# Patient Record
Sex: Female | Born: 1995
Health system: Southern US, Community
[De-identification: ages and names within clinical notes are randomized; demographics above are authoritative.]

## PROBLEM LIST (undated history)

## (undated) DIAGNOSIS — F32A Depression, unspecified: Secondary | ICD-10-CM

## (undated) DIAGNOSIS — G43909 Migraine, unspecified, not intractable, without status migrainosus: Secondary | ICD-10-CM

## (undated) DIAGNOSIS — E079 Disorder of thyroid, unspecified: Secondary | ICD-10-CM

## (undated) DIAGNOSIS — F329 Major depressive disorder, single episode, unspecified: Secondary | ICD-10-CM

## (undated) HISTORY — DX: Disorder of thyroid, unspecified: E07.9

## (undated) HISTORY — DX: Major depressive disorder, single episode, unspecified: F32.9

## (undated) HISTORY — DX: Depression, unspecified: F32.A

## (undated) HISTORY — DX: Migraine, unspecified, not intractable, without status migrainosus: G43.909

---
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
## 2018-01-04 DIAGNOSIS — J029 Acute pharyngitis, unspecified: Secondary | ICD-10-CM | POA: Diagnosis not present

## 2018-01-27 ENCOUNTER — Encounter: Payer: Self-pay | Admitting: Family Medicine

## 2018-01-27 ENCOUNTER — Ambulatory Visit: Payer: 59 | Admitting: Family Medicine

## 2018-01-27 VITALS — BP 118/82 | HR 112 | Temp 98.5°F | Ht 64.0 in | Wt 149.0 lb

## 2018-01-27 DIAGNOSIS — Z7689 Persons encountering health services in other specified circumstances: Secondary | ICD-10-CM | POA: Diagnosis not present

## 2018-01-27 DIAGNOSIS — Z8669 Personal history of other diseases of the nervous system and sense organs: Secondary | ICD-10-CM | POA: Diagnosis not present

## 2018-01-27 DIAGNOSIS — L309 Dermatitis, unspecified: Secondary | ICD-10-CM | POA: Diagnosis not present

## 2018-01-27 DIAGNOSIS — Z8659 Personal history of other mental and behavioral disorders: Secondary | ICD-10-CM

## 2018-01-27 MED ORDER — TRIAMCINOLONE ACETONIDE 0.1 % EX CREA: 1.0000 "application " | TOPICAL_CREAM | Freq: Two times a day (BID) | CUTANEOUS | 1 refills | Status: AC

## 2018-01-27 NOTE — Patient Instructions (Signed)

## 2018-01-27 NOTE — Progress Notes (Signed)
Patient presents to clinic today to establish care.  Pt is accompanied by her sister and her nephew.  SUBJECTIVE: PMH: Pt is a 22 yo female with pmh sig for h/o depression, eczema, and h/o migraine.  Pt states it has been a few yrs since seeing a physician, was using UC.  H/o depression: -states happened a few yrs ago when d/c from the [employer]. -pt notes going to counseling at that time, which helped -pt was never on medication  H/o migraines: -last HA was yrs ago -pt denies current issues  Eczema: -pt notes hx since childhood -has flairs when the seasons change -currently using scented lotions and soaps, taking hot showers. -pt notes current flair is the worst it has been  Allergies: NKDA  Past Surg. Hx: none  Social hx: -pt is single.  She is currently working at [employer], which she enjoys.  Pt endorses social EtOH use.  Pt denies Tobacco and drug use.  Health Maintenance: PAP -- never had  Past Medical History:  Diagnosis Date  . Depression   . Migraines     History reviewed. No pertinent surgical history.  No current outpatient medications on file prior to visit.   No current facility-administered medications on file prior to visit.     No Known Allergies  Family History  Problem Relation Age of Onset  . Asthma Mother   . Hyperlipidemia Mother   . Hypertension Mother     Social History   Socioeconomic History  . Marital status: Single    Spouse name: Not on file  . Number of children: Not on file  . Years of education: Not on file  . Highest education level: Not on file  Occupational History  . Not on file  Social Needs  . Financial resource strain: Not on file  . Food insecurity:    Worry: Not on file    Inability: Not on file  . Transportation needs:    Medical: Not on file    Non-medical: Not on file  Tobacco Use  . Smoking status: Never Smoker  . Smokeless tobacco: Never Used  Substance and Sexual Activity  . Alcohol  use: Yes  . Drug use: Never  . Sexual activity: Not Currently  Lifestyle  . Physical activity:    Days per week: Not on file    Minutes per session: Not on file  . Stress: Not on file  Relationships  . Social connections:    Talks on phone: Not on file    Gets together: Not on file    Attends religious service: Not on file    Active member of club or organization: Not on file    Attends meetings of clubs or organizations: Not on file    Relationship status: Not on file  . Intimate partner violence:    Fear of current or ex partner: Not on file    Emotionally abused: Not on file    Physically abused: Not on file    Forced sexual activity: Not on file  Other Topics Concern  . Not on file  Social History Narrative  . Not on file    ROS General: Denies fever, chills, night sweats, changes in weight, changes in appetite HEENT: Denies headaches, ear pain, changes in vision, rhinorrhea, sore throat CV: Denies CP, palpitations, SOB, orthopnea Pulm: Denies SOB, cough, wheezing GI: Denies abdominal pain, nausea, vomiting, diarrhea, constipation GU: Denies dysuria, hematuria, frequency, vaginal discharge Msk: Denies muscle cramps,  joint pains Neuro: Denies weakness, numbness, tingling Skin: Denies bruising  +rash Psych: Denies depression, anxiety, hallucinations  BP 118/82 (BP Location: Left Arm, Patient Position: Sitting, Cuff Size: Normal)   Pulse (!) 112   Temp 98.5 F (36.9 C) (Oral)   Ht 5\' 4"  (1.626 m)   Wt 149 lb (67.6 kg)   LMP 01/21/2018 (Exact Date)   SpO2 98%   BMI 25.58 kg/m   Physical Exam Gen. Pleasant, well developed, well-nourished, in NAD HEENT - [place]/AT, PERRL, no scleral icterus, no nasal drainage, pharynx without erythema or exudate. Lungs: no use of accessory muscles, no dullness to percussion, CTAB, no wheezes, rales or rhonchi Cardiovascular: RRR, No r/g/m, no peripheral edema Neuro:  A&Ox3, CN II-XII intact, normal gait Skin:  Warm, dry, intact.   Fine, rough papules on dorsum of hands, forearms, medial thighs b/l.  No results found for this or any previous visit (from the past 2160 hour(s)).  Assessment/Plan: Eczema, unspecified type  -given handouts -discussed taking warm not hot showers, using gentle lotions, detergents, and soaps for sensitive skin - Plan: triamcinolone cream (KENALOG) 0.1 %  History of migraine -stable -discussed increasing po intake of water, decreasing caffeine, and getting plenty of rest.  History of depression -stable  Encounter to est care --We reviewed the PMH, PSH, FH, SH, Meds and Allergies. -We provided refills for any medications we will prescribe as needed. -We addressed current concerns per orders and patient instructions. -We have asked for records for pertinent exams, studies, vaccines and notes from previous providers. -We have advised patient to follow up per instructions below.  F/u in the next few wks to months for CPE and pap.  Abbe Amsterdam, MD

## 2018-01-31 ENCOUNTER — Other Ambulatory Visit (HOSPITAL_COMMUNITY)
Admission: RE | Admit: 2018-01-31 | Discharge: 2018-01-31 | Disposition: A | Discharge status: Home / Self Care | Payer: 59 | Source: Ambulatory Visit | Attending: Family Medicine | Admitting: Family Medicine

## 2018-01-31 ENCOUNTER — Ambulatory Visit (INDEPENDENT_AMBULATORY_CARE_PROVIDER_SITE_OTHER): Payer: 59 | Admitting: Family Medicine

## 2018-01-31 ENCOUNTER — Encounter: Payer: Self-pay | Admitting: Family Medicine

## 2018-01-31 VITALS — BP 108/70 | HR 100 | Temp 98.5°F | Wt 154.0 lb

## 2018-01-31 DIAGNOSIS — Z124 Encounter for screening for malignant neoplasm of cervix: Secondary | ICD-10-CM | POA: Diagnosis not present

## 2018-01-31 DIAGNOSIS — Z0000 Encounter for general adult medical examination without abnormal findings: Secondary | ICD-10-CM

## 2018-01-31 DIAGNOSIS — E049 Nontoxic goiter, unspecified: Secondary | ICD-10-CM

## 2018-01-31 NOTE — Progress Notes (Signed)
Subjective:     Sheri Foster is a 22 y.o. female and is here for a comprehensive physical exam. The patient reports no problems.  Pt has not yet picked up the triamcinolone cream for her eczema.  Pt has never had a pap.  Social History   Socioeconomic History  . Marital status: Single    Spouse name: Not on file  . Number of children: Not on file  . Years of education: Not on file  . Highest education level: Not on file  Occupational History  . Not on file  Social Needs  . Financial resource strain: Not on file  . Food insecurity:    Worry: Not on file    Inability: Not on file  . Transportation needs:    Medical: Not on file    Non-medical: Not on file  Tobacco Use  . Smoking status: Never Smoker  . Smokeless tobacco: Never Used  Substance and Sexual Activity  . Alcohol use: Yes  . Drug use: Never  . Sexual activity: Not Currently  Lifestyle  . Physical activity:    Days per week: Not on file    Minutes per session: Not on file  . Stress: Not on file  Relationships  . Social connections:    Talks on phone: Not on file    Gets together: Not on file    Attends religious service: Not on file    Active member of club or organization: Not on file    Attends meetings of clubs or organizations: Not on file    Relationship status: Not on file  . Intimate partner violence:    Fear of current or ex partner: Not on file    Emotionally abused: Not on file    Physically abused: Not on file    Forced sexual activity: Not on file  Other Topics Concern  . Not on file  Social History Narrative  . Not on file   Health Maintenance  Topic Date Due  . HIV Screening  08/11/2008  . TETANUS/TDAP  08/11/2012  . PAP SMEAR  08/12/2014  . INFLUENZA VACCINE  11/25/2017    The following portions of the patient's history were reviewed and updated as appropriate: allergies, current medications, past family history, past medical history, past social history, past surgical  history and problem list.  Review of Systems Pertinent items noted in HPI and remainder of comprehensive ROS otherwise negative.   Objective:    BP 108/70 (BP Location: Left Arm, Patient Position: Sitting, Cuff Size: Normal)   Pulse 100   Temp 98.5 F (36.9 C) (Oral)   Wt 154 lb (69.9 kg)   LMP 01/21/2018 (Exact Date)   SpO2 98%   BMI 26.43 kg/m  General appearance: alert, cooperative, appears stated age and no distress Head: Normocephalic, without obvious abnormality, atraumatic Eyes: conjunctivae/corneas clear. PERRL, EOM's intact. Fundi benign. Ears: normal TM's and external ear canals both ears Nose: Nares normal. Septum midline. Mucosa normal. No drainage or sinus tenderness. Throat: lips, mucosa, and tongue normal; teeth and gums normal Neck: no adenopathy, no JVD, supple, symmetrical, trachea midline and thyroid enlarged  R lobe>L, symmetric, no tenderness/mass/nodules Heart: regular rate and rhythm, S1, S2 normal, no murmur, click, rub or gallop Abdomen: soft, non-tender; bowel sounds normal; no masses,  no organomegaly Pelvic: cervix normal in appearance, external genitalia normal, no adnexal masses or tenderness, no cervical motion tenderness, uterus normal size, shape, and consistency and vagina normal without discharge Extremities: extremities normal, atraumatic,  no cyanosis or edema Skin: Skin color, texture, turgor normal. No rashes or lesions Neurologic: Alert and oriented X 3, normal strength and tone. Normal symmetric reflexes. Normal coordination and gait    Assessment:    Healthy female exam.      Plan:     Anticipatory guidance given including wearing seatbelts, smoke detectors in the home, increasing physical activity, increasing p.o. intake of water and vegetables. -handout given -declines influenza vaccine -obtain labs -pap done this visit See After Visit Summary for Counseling Recommendations    Goiter -TSH, free T4, antithyroglobulin  F/u  prn  Abbe Amsterdam, MD

## 2018-01-31 NOTE — Patient Instructions (Addendum)
Goiter A goiter is an enlarged thyroid gland. The thyroid gland is located in the lower front of the neck. The gland produces hormones that regulate mood, body temperature, pulse rate, and digestion. Most goiters are painless and are not a cause for serious concern. Goiters and conditions that cause goiters can be treated, if necessary. What are the causes? Causes of this condition include:  Diseases that attack healthy cells in your body (autoimmune diseases) and affect your thyroid function, such as: ? Graves disease. This causes too much thyroid hormone to be produced and it makes your thyroid overly active (hyperthyroidism). ? Hashimoto disease. This type of inflammation of the thyroid (thyroiditis) causes too little thyroid hormone to be produced and it makes your thyroid not active enough (hypothyroidism).  Other conditions that cause thyroiditis.  Nodular goiter. This means that there are one or more small growths on your thyroid. These can create too much thyroid hormone.  Pregnancy.  Thyroid cancer. This is rare.  Certain medicines.  Radiation exposure.  Iodine deficiency.  In some cases, the cause may not be known (idiopathic). What increases the risk? This condition is more likely to develop in:  People who have a family history of goiter.  Women.  People who do not get enough iodine in their diet.  People who are older than 68.  People who smoke tobacco.  What are the signs or symptoms? Common symptoms of this condition include:  Swelling in the lower part of the neck. This swelling can range from a very small bump to a large lump.  A tight feeling in the throat.  A hoarse voice.  Other symptoms include:  Coughing.  Wheezing.  Difficulty swallowing.  Difficulty breathing.  Bulging neck veins.  Dizziness.  In some cases, there are no symptoms and thyroid hormone levels may be normal. When a goiter is the result of hyperthyroidism, symptoms  may also include:  Nervousness or restlessness.  Inability to tolerate heat.  Unexplained weight loss.  Diarrhea.  Change in the texture of hair or skin.  Changes in heart beat, such as skipped beats, extra beats, or a rapid heart rate.  Loss of menstruation.  Shaky hands.  Increased appetite.  Sleep problems.  When a goiter is the result of hypothyroidism, symptoms may also include:  Feeling like you have no energy (lethargy).  Inability to tolerate cold.  Weight gain that is not explained by a change in diet or exercise habits.  Dry skin.  Coarse hair.  Menstrual irregularity.  Constipation.  Sadness or depression.  How is this diagnosed? This condition may be diagnosed with a medical history and physical exam. You may also have other tests, including:  Blood tests to check thyroid function.  Imaging tests, such as: ? Ultrasonography. ? CT scan. ? MRI. ? Thyroid scan. You will be given a safe radioactive injection, then images will be taken of your thyroid.  Tissue sample (biopsy) of the goiter or any nodules. This checks to see if the goiter or nodules are cancerous.  How is this treated? Treatment for this condition depends on the cause. Treatment may include:  Medicines to control your thyroid.  Anti-inflammatory or steroid medicines, if inflammation is the cause.  Iodine supplements or changes in diet, if the goiter is caused by iodine deficiency.  Radiation therapy.  Surgery to remove your thyroid.  In some cases, no treatment is necessary, and your health care provider will monitor your condition at regular checkups. Follow these instructions  at home:  Follow recommendations from your health care provider for any changes to your diet.  Take over-the-counter and prescription medicines only as told by your health care provider.  Do not use any tobacco products, including cigarettes, chewing tobacco, or e-cigarettes. If you need help  quitting, ask your health care provider.  Keep all follow-up appointments as told by your health care provider. This is important. Contact a health care provider if:  Your symptoms do not get better with treatment. Get help right away if:  You develop sudden, unexplained confusion or other mental changes.  You have nausea, vomiting, or diarrhea.  You develop a fever.  Your skin or the whites of your eyes appear yellow (jaundice).  You develop chest pain.  You have trouble breathing or swallowing.  You suddenly become very weak.  You experience extreme restlessness. This information is not intended to replace advice given to you by your health care provider. Make sure you discuss any questions you have with your health care provider. Document Released: 10/01/2009 Document Revised: 11/01/2015 Document Reviewed: 04/09/2014 Elsevier Interactive Patient Education  2018 [place] 18-39 Years, Female Preventive care refers to lifestyle choices and visits with your health care provider that can promote health and wellness. What does preventive care include?  A yearly physical exam. This is also called an annual well check.  Dental exams once or twice a year.  Routine eye exams. Ask your health care provider how often you should have your eyes checked.  Personal lifestyle choices, including: ? Daily care of your teeth and gums. ? Regular physical activity. ? Eating a healthy diet. ? Avoiding tobacco and drug use. ? Limiting alcohol use. ? Practicing safe sex. ? Taking vitamin and mineral supplements as recommended by your health care provider. What happens during an annual well check? The services and screenings done by your health care provider during your annual well check will depend on your age, overall health, lifestyle risk factors, and family history of disease. Counseling Your health care provider may ask you questions about your:  Alcohol  use.  Tobacco use.  Drug use.  Emotional well-being.  Home and relationship well-being.  Sexual activity.  Eating habits.  Work and work Statistician.  Method of birth control.  Menstrual cycle.  Pregnancy history.  Screening You may have the following tests or measurements:  Height, weight, and BMI.  Diabetes screening. This is done by checking your blood sugar (glucose) after you have not eaten for a while (fasting).  Blood pressure.  Lipid and cholesterol levels. These may be checked every 5 years starting at age 11.  Skin check.  Hepatitis C blood test.  Hepatitis B blood test.  Sexually transmitted disease (STD) testing.  BRCA-related cancer screening. This may be done if you have a family history of breast, ovarian, tubal, or peritoneal cancers.  Pelvic exam and Pap test. This may be done every 3 years starting at age 81. Starting at age 15, this may be done every 5 years if you have a Pap test in combination with an HPV test.  Discuss your test results, treatment options, and if necessary, the need for more tests with your health care provider. Vaccines Your health care provider may recommend certain vaccines, such as:  Influenza vaccine. This is recommended every year.  Tetanus, diphtheria, and acellular pertussis (Tdap, Td) vaccine. You may need a Td booster every 10 years.  Varicella vaccine. You may need this if you have not  been vaccinated.  HPV vaccine. If you are 3 or younger, you may need three doses over 6 months.  Measles, mumps, and rubella (MMR) vaccine. You may need at least one dose of MMR. You may also need a second dose.  Pneumococcal 13-valent conjugate (PCV13) vaccine. You may need this if you have certain conditions and were not previously vaccinated.  Pneumococcal polysaccharide (PPSV23) vaccine. You may need one or two doses if you smoke cigarettes or if you have certain conditions.  Meningococcal vaccine. One dose is  recommended if you are age 66-21 years and a first-year college student living in a residence hall, or if you have one of several medical conditions. You may also need additional booster doses.  Hepatitis A vaccine. You may need this if you have certain conditions or if you travel or work in places where you may be exposed to hepatitis A.  Hepatitis B vaccine. You may need this if you have certain conditions or if you travel or work in places where you may be exposed to hepatitis B.  Haemophilus influenzae type b (Hib) vaccine. You may need this if you have certain risk factors.  Talk to your health care provider about which screenings and vaccines you need and how often you need them. This information is not intended to replace advice given to you by your health care provider. Make sure you discuss any questions you have with your health care provider. Document Released: 06/09/2001 Document Revised: 01/01/2016 Document Reviewed: 02/12/2015 Elsevier Interactive Patient Education  [employer].

## 2018-02-01 LAB — CBC
HCT: 35.2 % — ABNORMAL LOW (ref 36.0–46.0)
Hemoglobin: 11.4 g/dL — ABNORMAL LOW (ref 12.0–15.0)
MCHC: 32.2 g/dL (ref 30.0–36.0)
MCV: 78.9 fl (ref 78.0–100.0)
Platelets: 204 10*3/uL (ref 150.0–400.0)
RBC: 4.47 Mil/uL (ref 3.87–5.11)
RDW: 12.9 % (ref 11.5–15.5)
WBC: 4.1 10*3/uL (ref 4.0–10.5)

## 2018-02-01 LAB — TSH: TSH: <0.01 u[IU]/mL — ABNORMAL LOW (ref 0.35–4.50)

## 2018-02-01 LAB — T4, FREE: Free T4: 4.44 ng/dL — ABNORMAL HIGH (ref 0.60–1.60)

## 2018-02-01 LAB — THYROGLOBULIN ANTIBODY: Thyroglobulin Ab: 1 IU/mL (ref <=1)

## 2018-02-02 ENCOUNTER — Other Ambulatory Visit: Payer: Self-pay | Admitting: Family Medicine

## 2018-02-02 DIAGNOSIS — E0590 Thyrotoxicosis, unspecified without thyrotoxic crisis or storm: Secondary | ICD-10-CM

## 2018-02-02 LAB — CYTOLOGY - PAP: Diagnosis: NEGATIVE

## 2018-02-10 ENCOUNTER — Ambulatory Visit
Admission: RE | Admit: 2018-02-10 | Discharge: 2018-02-10 | Disposition: A | Discharge status: Home / Self Care | Payer: 59 | Source: Ambulatory Visit | Attending: Family Medicine | Admitting: Family Medicine

## 2018-02-10 DIAGNOSIS — E042 Nontoxic multinodular goiter: Secondary | ICD-10-CM | POA: Diagnosis not present

## 2018-02-10 DIAGNOSIS — E0590 Thyrotoxicosis, unspecified without thyrotoxic crisis or storm: Secondary | ICD-10-CM

## 2018-02-14 ENCOUNTER — Other Ambulatory Visit: Payer: Self-pay | Admitting: Family Medicine

## 2018-02-14 ENCOUNTER — Telehealth: Payer: Self-pay | Admitting: Family Medicine

## 2018-02-14 DIAGNOSIS — E0590 Thyrotoxicosis, unspecified without thyrotoxic crisis or storm: Secondary | ICD-10-CM

## 2018-02-14 NOTE — Telephone Encounter (Signed)
Copied from CRM [id number]. Topic: General - Inquiry >> Feb 14, 2018 12:00 PM Sheri Foster wrote: Reason for CRM: pt called to f/u on results for ultrasound stating she has not received a call. Please advise.

## 2018-02-15 NOTE — Telephone Encounter (Signed)
Spoke with pt voiced understanding of her Thyroid US results, pt has been scheduled for lab repeat for TSH and order placed.

## 2018-02-24 ENCOUNTER — Ambulatory Visit: Payer: 59 | Admitting: Family Medicine

## 2018-02-24 ENCOUNTER — Encounter: Payer: Self-pay | Admitting: Family Medicine

## 2018-02-24 VITALS — BP 102/84 | HR 110 | Temp 100.0°F | Wt 152.0 lb

## 2018-02-24 DIAGNOSIS — E061 Subacute thyroiditis: Secondary | ICD-10-CM

## 2018-02-24 DIAGNOSIS — J101 Influenza due to other identified influenza virus with other respiratory manifestations: Secondary | ICD-10-CM | POA: Diagnosis not present

## 2018-02-24 DIAGNOSIS — R509 Fever, unspecified: Secondary | ICD-10-CM

## 2018-02-24 LAB — POCT INFLUENZA A/B
Influenza A, POC: POSITIVE — AB
Influenza B, POC: NEGATIVE

## 2018-02-24 NOTE — Progress Notes (Signed)
Subjective:    Patient ID: Sheri Foster, female    DOB: 1996-01-05, 22 y.o.   MRN: [id number]  No chief complaint on file. Pt is accompanied by her mother and sister.  HPI Patient was seen today for acute concern.  Pt endorses fever, chest congestion, body aches, chest pressure and cough since Sunday.  Pt also notes feeling dizzy, tired, and having a HA.  Pt denies sick contacts.  Pt's mother has questions about recent thyroid u/s. Past Medical History:  Diagnosis Date  . Depression   . Migraines     No Known Allergies  ROS General: Denies fever, chills, night sweats, changes in weight, changes in appetite  +subjective fever, dizziness, body aches HEENT: Denies ear pain, changes in vision, rhinorrhea  +HAs, sore throat CV: Denies CP, palpitations, SOB, orthopnea Pulm: Denies SOB, wheezing  +cough, chest congestion GI: Denies abdominal pain, nausea, vomiting, diarrhea, constipation GU: Denies dysuria, hematuria, frequency, vaginal discharge Msk: Denies muscle cramps, joint pains Neuro: Denies weakness, numbness, tingling Skin: Denies rashes, bruising Psych: Denies depression, anxiety, hallucinations    Objective:    Blood pressure 102/84, pulse (!) 110, temperature 100 F (37.8 C), temperature source Oral, weight 152 lb (68.9 kg), SpO2 97 %.  Gen. Pleasant, well-nourished, in no distress but appears uncomfortable, normal affect   HEENT: [place]/AT, face symmetric, no scleral icterus, PERRLA, nares patent without drainage, pharynx with mild erythema, no exudate.  TMs normal b/l.  Mild cervical lymphadenopathy.  Goiter present, nontender. Lungs: cough, no accessory muscle use, CTAB, no wheezes or rales Cardiovascular: Tachycardic, no m/r/g, no peripheral edema Neuro:  A&Ox3, CN II-XII intact, normal gait  Wt Readings from Last 3 Encounters:  02/24/18 152 lb (68.9 kg)  01/31/18 154 lb (69.9 kg)  01/27/18 149 lb (67.6 kg)    Lab Results  Component Value Date   WBC 4.1  01/31/2018   HGB 11.4 (L) 01/31/2018   HCT 35.2 (L) 01/31/2018   PLT 204.0 01/31/2018   TSH <0.01 Repeated and verified X2. (L) 01/31/2018    Assessment/Plan:  Influenza A -supportive care -antiviral meds such as tamiflu not indicated 2/2 duration of symptoms. -given handout  Fever, unspecified fever cause  - Plan: POC Influenza A/B  Positive  Subacute thyroiditis -discussed likely cause, pt has a viral illness several wks prior to seeing this provider -discussed likely disease course and reviewed u/s results -given handout  F/u prn.  Pt has repeat TSH and T4, free in a few wks.  Abbe Amsterdam, MD

## 2018-02-24 NOTE — Patient Instructions (Addendum)

## 2018-03-01 ENCOUNTER — Encounter: Payer: Self-pay | Admitting: Family Medicine

## 2018-03-17 ENCOUNTER — Other Ambulatory Visit (INDEPENDENT_AMBULATORY_CARE_PROVIDER_SITE_OTHER): Payer: 59

## 2018-03-17 DIAGNOSIS — E0590 Thyrotoxicosis, unspecified without thyrotoxic crisis or storm: Secondary | ICD-10-CM

## 2018-03-18 LAB — TSH

## 2018-03-21 ENCOUNTER — Other Ambulatory Visit: Payer: Self-pay | Admitting: Family Medicine

## 2018-03-21 DIAGNOSIS — E061 Subacute thyroiditis: Secondary | ICD-10-CM

## 2018-04-18 ENCOUNTER — Encounter: Payer: Self-pay | Admitting: Internal Medicine

## 2018-04-18 ENCOUNTER — Ambulatory Visit: Payer: 59 | Admitting: Internal Medicine

## 2018-04-18 VITALS — BP 140/78 | HR 111 | Ht 64.0 in | Wt 159.6 lb

## 2018-04-18 DIAGNOSIS — E0590 Thyrotoxicosis, unspecified without thyrotoxic crisis or storm: Secondary | ICD-10-CM

## 2018-04-18 LAB — TSH: TSH: <0.01 u[IU]/mL — ABNORMAL LOW (ref 0.35–4.50)

## 2018-04-18 MED ORDER — ATENOLOL 50 MG PO TABS: 50.0000 mg | ORAL_TABLET | Freq: Every day | ORAL | 3 refills | Status: DC

## 2018-04-18 MED ORDER — METHIMAZOLE 10 MG PO TABS: 20.0000 mg | ORAL_TABLET | Freq: Every day | ORAL | 6 refills | Status: DC

## 2018-04-18 NOTE — Patient Instructions (Signed)
We recommend that you follow these hyperthyroidism instructions at home:  1) Take Methimazole 20 mg once a day  If you develop severe sore throat with high fevers OR develop unexplained yellowing of your skin, eyes, under your tongue, severe abdominal pain with nausea or vomiting --> then please get evaluated immediately.  2) Atenolol 50 mg one time a day 3) Get repeat thyroid labs in 3 weeks.   It is ESSENTIAL to get follow-up labs to help avoid over or undertreatment of your hyperthyroidism - both of which can be dangerous to your health.

## 2018-04-18 NOTE — Progress Notes (Signed)
Name: Sheri FredricksonJasmine T. Foster  MRN/ DOB: [id number], [birth year]    Age/ Sex: 22 y.o., female    PCP: Deeann SaintBanks, Shannon R, MD   Reason for Endocrinology Evaluation: Hyperthyroidism      Date of Initial Endocrinology Evaluation: 04/18/18    HPI: Ms. Sheri CellaJasmine T. Salomon Foster is a 22 y.o. female with a past medical history of eczema and migraine headaches. The patient presented for initial endocrinology clinic visit on 04/18/18 for consultative assistance with her hyperthytroidism.   As per patient she presented to seek medical attention with URI symptoms, that's when she noted to have an enlarged thyroid. She denies having any pain at the time. TFT's shows low TSH and elevated FT4 as below.  Today she has noted some decrease in the size of her thyroid, she continues to deny pain. She has occasional dysphagia and mild voice hoarseness but no sob.    Pt denies weight loss, on the contrary she has noted weight gain, heat intolerance , palpitations, she endorses  Has jittery sensation  Has pressure around the eyes and dry , denies double vision or blurry vision,. Denies proptosis or tearing.   LMP 04/15/18 regular. Never been sexually active    HISTORY:  Past Medical History:  Past Medical History:  Diagnosis Date  . Depression   . Migraines     Past Surgical History: No past surgical history on file.   Social History:  reports that she has never smoked. She has never used smokeless tobacco. She reports current alcohol use. She reports that she does not use drugs.  Family History: family history includes Asthma in her mother; Hyperlipidemia in her mother; Hypertension in her mother; Hyperthyroidism in her mother; Hypothyroidism in an other family member.   HOME MEDICATIONS: No current outpatient medications on file prior to visit.   No current facility-administered medications on file prior to visit.       REVIEW OF SYSTEMS: A comprehensive ROS was conducted with the patient and is  negative except as per HPI and below:  Review of Systems  Constitutional: Positive for malaise/fatigue. Negative for weight loss.  HENT: Positive for congestion and sore throat.   Eyes: Positive for blurred vision. Negative for double vision.  Respiratory: Negative for cough and shortness of breath.   Cardiovascular: Positive for palpitations. Negative for chest pain.  Gastrointestinal: Positive for constipation. Negative for nausea.  Genitourinary: Negative for frequency.  Musculoskeletal: Negative for neck pain.  Skin: Negative for itching and rash.  Neurological: Positive for tremors. Negative for tingling.  Endo/Heme/Allergies: Negative for polydipsia.  Psychiatric/Behavioral: Negative for depression. The patient is nervous/anxious.        OBJECTIVE:  VS: BP 140/78 (BP Location: Left Arm, Patient Position: Sitting, Cuff Size: Normal)   Pulse (!) 111   Ht 5\' 4"  (1.626 m)   Wt 159 lb 9.6 oz (72.4 kg)   BMI 27.40 kg/m    Wt Readings from Last 3 Encounters:  04/18/18 159 lb 9.6 oz (72.4 kg)  02/24/18 152 lb (68.9 kg)  01/31/18 154 lb (69.9 kg)     EXAM: General: Pt appears well and is in NAD  Hydration: Well-hydrated with moist mucous membranes and good skin turgor  Eyes: External eye exam normal without stare, lid lag or exophthalmos.  EOM intact.  PERRL.  Ears, Nose, Throat: Hearing: Grossly intact bilaterally Dental: Good dentition  Throat: Clear without mass, erythema or exudate  Neck: General: Supple without adenopathy. Thyroid: Thyroid is enlarged ~ 80 grams.  No nodules appreciated.Left  thyroid bruit noted   Lungs: Clear with good BS bilat with no rales, rhonchi, or wheezes  Heart: Auscultation: RRR.  Abdomen: Normoactive bowel sounds, soft, nontender, without masses or organomegaly palpable  Extremities:  BL LE: No pretibial edema normal ROM and strength.  Skin: Hair: Texture and amount normal with gender appropriate distribution Skin Inspection: No  rashes. Skin Palpation: Skin temperature, texture, and thickness normal to palpation  Neuro: Cranial nerves: II - XII grossly intact  Motor: Normal strength throughout DTRs: 2+ and symmetric in UE without delay in relaxation phase  Mental Status: Judgment, insight: Intact Orientation: Oriented to time, place, and person Mood and affect: No depression, anxiety, or agitation     DATA REVIEWED: Results for Clayborn BignessBANKS, Sheri T. (MRN [id number]) as of 04/19/2018 07:40  Ref. Range 01/31/2018 17:07 01/31/2018 17:12 03/17/2018 16:20 04/18/2018 15:58  TSH Latest Ref Range: 0.35 - 4.50 uIU/mL <0.01 Repeated and verified X2. (L)  <0.01 (L) <0.01 (L)  Triiodothyronine (T3) Latest Ref Range: 76 - 181 ng/dL    253777 (H)  G6,YQIH(KVQQVZT4,Free(Direct) Latest Ref Range: 0.60 - 1.60 ng/dL 5.634.44 (H)   8.754.81 (H)  Thyroglobulin Ab Latest Ref Range: < or = 1 IU/mL  1        ASSESSMENT/PLAN/RECOMMENDATIONS:   1. Hyperthyroidism Secondary to Graves' Disease:   - Pt is clinically and Biochemically euthyroid  We discussed that hyperthyroidism is a result of an autoimmune condition involving the thyroid.    We discussed with pt the benefits of methimazole in the Tx of hyperthyroidism, as well as the possible side effects/complications of anti-thyroid drug Tx (specifically detailing the rare, but serious side effect of agranulocytosis). She was informed of need for regular thyroid function monitoring while on methimazole to ensure appropriate dosage without over-treatment. As well, we discussed the possible side effects of methimazole including the chance of rash, the small chance of liver irritation/juandice and the <=1 in 300-400 chance of sudden onset agranulocytosis.  We discussed importance of going to ED promptly (and stopping methimazole) if shewere to develop significant fever with severe sore throat of other evidence of acute infection.     We extensively discussed the various treatment options for hyperthyroidism and Graves  disease including ablation therapy with radioactive iodine versus antithyroid drug treatment versus surgical therapy.  We recommended to the patient that we felt, at this time, that thionamide therapy would be most optimal.  We discussed the various possible benefits versus side effects of the various therapies.   We discussed that we keep her on thionamide therapy between 1-1.5 yr to give her the chance to go in to remission.    Medications :  Methimazole 20 mg daily  Atenolol 50 mg daily     F/U in 6 weeks  Lab in 3 weeks   Signed electronically by: Lyndle HerrlichAbby Jaralla Laisa Larrick, MD  [facility]  [facility] [address] [place], [place] [address] Phone: [phone] FAX: [phone]   CC: Deeann SaintBanks, Shannon R, MD [address] [place] [address] Phone: [phone] Fax: [phone]   Return to Endocrinology clinic as below: Future Appointments  Date Time Provider Department Center  05/09/2018  3:45 PM [facility] None  05/31/2018  3:40 PM Rilynn Habel, Konrad DoloresIbtehal Jaralla, MD [facility] None

## 2018-04-19 LAB — T4, FREE: Free T4: 4.81 ng/dL — ABNORMAL HIGH (ref 0.60–1.60)

## 2018-04-21 ENCOUNTER — Encounter: Payer: Self-pay | Admitting: Internal Medicine

## 2018-04-21 LAB — T3: T3, Total: 777 ng/dL — ABNORMAL HIGH (ref 76–181)

## 2018-04-21 LAB — THYROTROPIN RECEPTOR AUTOABS: Thyrotropin Receptor Ab: 72.1 % — ABNORMAL HIGH (ref <=16.0)

## 2018-05-06 ENCOUNTER — Other Ambulatory Visit: Payer: Self-pay | Admitting: Internal Medicine

## 2018-05-06 DIAGNOSIS — E0590 Thyrotoxicosis, unspecified without thyrotoxic crisis or storm: Secondary | ICD-10-CM

## 2018-05-09 ENCOUNTER — Other Ambulatory Visit (INDEPENDENT_AMBULATORY_CARE_PROVIDER_SITE_OTHER): Payer: 59

## 2018-05-09 DIAGNOSIS — E0590 Thyrotoxicosis, unspecified without thyrotoxic crisis or storm: Secondary | ICD-10-CM

## 2018-05-09 LAB — T4, FREE: Free T4: 4.41 ng/dL — ABNORMAL HIGH (ref 0.60–1.60)

## 2018-05-09 LAB — TSH: TSH: <0.01 u[IU]/mL — ABNORMAL LOW (ref 0.35–4.50)

## 2018-05-13 ENCOUNTER — Telehealth: Payer: Self-pay | Admitting: Internal Medicine

## 2018-05-13 NOTE — Telephone Encounter (Signed)
Patient called [facility] @ 5:11pm stating she was returning a call from the office regarding lab results.

## 2018-05-16 NOTE — Telephone Encounter (Signed)
Attempted to call pt again. Left voicemail.

## 2018-05-16 NOTE — Telephone Encounter (Signed)
Pt called back and was given MD message. Pt confirmed that she is taking Methimazole 20mg  daily. Pt stated that she does not need refills.

## 2018-05-25 ENCOUNTER — Encounter: Payer: Self-pay | Admitting: Adult Health

## 2018-05-25 ENCOUNTER — Ambulatory Visit: Payer: 59 | Admitting: Adult Health

## 2018-05-25 VITALS — BP 126/86 | Temp 98.3°F | Wt 148.0 lb

## 2018-05-25 DIAGNOSIS — J0140 Acute pansinusitis, unspecified: Secondary | ICD-10-CM

## 2018-05-25 MED ORDER — FLUTICASONE PROPIONATE 50 MCG/ACT NA SUSP: 2.0000 | Freq: Every day | NASAL | 6 refills | Status: DC

## 2018-05-25 MED ORDER — DOXYCYCLINE HYCLATE 100 MG PO CAPS
100.0000 mg | ORAL_CAPSULE | Freq: Two times a day (BID) | ORAL | 0 refills | Status: DC
Start: 2018-05-25 — End: 2018-08-19

## 2018-05-25 NOTE — Progress Notes (Signed)
Subjective:    Patient ID: Sheri Foster, female    DOB: 04-Jan-1996, 23 y.o.   MRN: [id number]  URI   This is a new problem. The current episode started in the past 7 days. The problem has been waxing and waning. Maximum temperature: subjective  Associated symptoms include congestion, coughing (productive ), headaches, a plugged ear sensation, rhinorrhea, sinus pain and a sore throat. Pertinent negatives include no dysuria, ear pain, nausea, neck pain, swollen glands or wheezing. Treatments tried: Nyquil  The treatment provided no relief.      Review of Systems  Constitutional: Positive for activity change, appetite change, chills, diaphoresis, fatigue and fever.  HENT: Positive for congestion, postnasal drip, rhinorrhea, sinus pressure, sinus pain and sore throat. Negative for ear pain and trouble swallowing.   Respiratory: Positive for cough (productive ). Negative for chest tightness, shortness of breath and wheezing.   Cardiovascular: Negative.   Gastrointestinal: Negative for nausea.  Genitourinary: Negative for dysuria.  Musculoskeletal: Negative for neck pain.  Skin: Negative.   Neurological: Positive for headaches.   Past Medical History:  Diagnosis Date  . Depression   . Migraines     Social History   Socioeconomic History  . Marital status: Single    Spouse name: Not on file  . Number of children: Not on file  . Years of education: Not on file  . Highest education level: Not on file  Occupational History  . Not on file  Social Needs  . Financial resource strain: Not on file  . Food insecurity:    Worry: Not on file    Inability: Not on file  . Transportation needs:    Medical: Not on file    Non-medical: Not on file  Tobacco Use  . Smoking status: Never Smoker  . Smokeless tobacco: Never Used  Substance and Sexual Activity  . Alcohol use: Yes  . Drug use: Never  . Sexual activity: Not Currently  Lifestyle  . Physical activity:    Days per week: Not  on file    Minutes per session: Not on file  . Stress: Not on file  Relationships  . Social connections:    Talks on phone: Not on file    Gets together: Not on file    Attends religious service: Not on file    Active member of club or organization: Not on file    Attends meetings of clubs or organizations: Not on file    Relationship status: Not on file  . Intimate partner violence:    Fear of current or ex partner: Not on file    Emotionally abused: Not on file    Physically abused: Not on file    Forced sexual activity: Not on file  Other Topics Concern  . Not on file  Social History Narrative  . Not on file    History reviewed. No pertinent surgical history.  Family History  Problem Relation Age of Onset  . Asthma Mother   . Hyperlipidemia Mother   . Hypertension Mother   . Hyperthyroidism Mother   . Hypothyroidism Other     No Known Allergies  Current Outpatient Medications on File Prior to Visit  Medication Sig Dispense Refill  . atenolol (TENORMIN) 50 MG tablet Take 1 tablet (50 mg total) by mouth daily. 30 tablet 3  . methimazole (TAPAZOLE) 10 MG tablet Take 2 tablets (20 mg total) by mouth daily. 60 tablet 6   No current facility-administered medications on file  prior to visit.     BP 126/86   Temp 98.3 F (36.8 C)   Wt 148 lb (67.1 kg)   BMI 25.40 kg/m       Objective:   Physical Exam Vitals signs and nursing note reviewed.  Constitutional:      Appearance: Normal appearance. She is diaphoretic.  HENT:     Right Ear: Tympanic membrane, ear canal and external ear normal. There is no impacted cerumen.     Left Ear: Tympanic membrane, ear canal and external ear normal. There is no impacted cerumen.     Nose: Mucosal edema and congestion present. No rhinorrhea.     Right Turbinates: Enlarged and swollen.     Left Turbinates: Enlarged.     Right Sinus: Maxillary sinus tenderness and frontal sinus tenderness present.     Left Sinus: Maxillary sinus  tenderness and frontal sinus tenderness present.     Mouth/Throat:     Pharynx: Oropharyngeal exudate present.  Eyes:     General: No scleral icterus.       Right eye: No discharge.        Left eye: No discharge.     Extraocular Movements: Extraocular movements intact.     Conjunctiva/sclera: Conjunctivae normal.     Pupils: Pupils are equal, round, and reactive to light.  Cardiovascular:     Rate and Rhythm: Normal rate and regular rhythm.     Pulses: Normal pulses.     Heart sounds: Normal heart sounds.  Pulmonary:     Effort: Pulmonary effort is normal.     Breath sounds: Normal breath sounds.  Skin:    General: Skin is warm.     Capillary Refill: Capillary refill takes less than 2 seconds.  Neurological:     General: No focal deficit present.     Mental Status: She is alert and oriented to person, place, and time. Mental status is at baseline.  Psychiatric:        Mood and Affect: Mood normal.        Behavior: Behavior normal.        Thought Content: Thought content normal.       Assessment & Plan:  1. Acute non-recurrent pansinusitis - doxycycline (VIBRAMYCIN) 100 MG capsule; Take 1 capsule (100 mg total) by mouth 2 (two) times daily.  Dispense: 14 capsule; Refill: 0 - fluticasone (FLONASE) 50 MCG/ACT nasal spray; Place 2 sprays into both nostrils daily.  Dispense: 16 g; Refill: 6 - Stay hydrated and rest  - Follow up if no improvement in the next 2-3 days   Shirline Freesory Wynona Duhamel, NP

## 2018-05-31 ENCOUNTER — Ambulatory Visit (INDEPENDENT_AMBULATORY_CARE_PROVIDER_SITE_OTHER): Payer: 59 | Admitting: Internal Medicine

## 2018-05-31 ENCOUNTER — Encounter: Payer: Self-pay | Admitting: Internal Medicine

## 2018-05-31 VITALS — BP 142/78 | HR 103 | Ht 64.0 in | Wt 150.6 lb

## 2018-05-31 DIAGNOSIS — E0590 Thyrotoxicosis, unspecified without thyrotoxic crisis or storm: Secondary | ICD-10-CM | POA: Insufficient documentation

## 2018-05-31 DIAGNOSIS — E0500 Thyrotoxicosis with diffuse goiter without thyrotoxic crisis or storm: Secondary | ICD-10-CM | POA: Insufficient documentation

## 2018-05-31 MED ORDER — METHIMAZOLE 10 MG PO TABS: 20.0000 mg | ORAL_TABLET | Freq: Two times a day (BID) | ORAL | 6 refills | Status: DC

## 2018-05-31 MED ORDER — ATENOLOL 100 MG PO TABS: 100.0000 mg | ORAL_TABLET | Freq: Every day | ORAL | 1 refills | Status: DC

## 2018-05-31 NOTE — Patient Instructions (Signed)
We recommend that you follow these hyperthyroidism instructions at home:  1) Take Methimazole 20 mg twice a day   If you develop severe sore throat with high fevers OR develop unexplained yellowing of your skin, eyes, under your tongue, severe abdominal pain with nausea or vomiting --> then please get evaluated immediately.  2) Atenolol 100 mg one time a day 3) Get repeat thyroid labs in 3 weeks.   It is ESSENTIAL to get follow-up labs to help avoid over or undertreatment of your hyperthyroidism - both of which can be dangerous to your health.

## 2018-05-31 NOTE — Progress Notes (Signed)
Name: Sheri Foster  MRN/ DOB: [id number], 1996/04/19    Age/ Sex: 23 y.o., female     PCP: Deeann SaintBanks, Shannon R, MD   Reason for Endocrinology Evaluation: Luiz BlareGraves' Disease      Initial Endocrinology Clinic Visit: 04/18/2018    PATIENT IDENTIFIER: Sheri Foster is a 23 y.o., female with a past medical history of eczema and migraine headaches . She has followed with [facility] since 04/18/18 for consultative assistance with management of her Graves' Disease   HISTORICAL SUMMARY:  As per patient she presented to seek medical attention with URI symptoms in October, 2019, that's when she noted to have an enlarged thyroid. She denies having any pain at the time. TFT's shows low TSH (<0.01 uIu/mL) and elevated FT4 (4.44 ng/dL )   Methimazole was started in 03/2018   Works for [employer]  SUBJECTIVE:   During last visit (04/18/18): She was started on methiamzole in December, 2019 at 20 mg daily.   Today (05/31/2018):  [name] is here for her 6 week follow up on graves' disease. In January we have advised her to increase her Methiamzole to 20 mg BID due to continued FT4 elevation. Patient states she has not been very compliant with her evening dose.    Her weight has been stable, she denies any local neck symptoms.  Has heat intolerance and palpitations  Denies diarrhea.   ROS:  As per HPI.   HISTORY:  Past Medical History:  Past Medical History:  Diagnosis Date  . Depression   . Migraines      Past Surgical History: No past surgical history on file.  Social History:  reports that she has never smoked. She has never used smokeless tobacco. She reports current alcohol use. She reports that she does not use drugs.  Family History: family history includes Asthma in her mother; Hyperlipidemia in her mother; Hypertension in her mother; Hyperthyroidism in her mother; Hypothyroidism in an other family member.   HOME MEDICATIONS: Allergies as of  05/31/2018   No Known Allergies     Medication List       Accurate as of May 31, 2018  4:14 PM. Always use your most recent med list.        atenolol 50 MG tablet Commonly known as:  TENORMIN Take 1 tablet (50 mg total) by mouth daily.   doxycycline 100 MG capsule Commonly known as:  VIBRAMYCIN Take 1 capsule (100 mg total) by mouth 2 (two) times daily.   fluticasone 50 MCG/ACT nasal spray Commonly known as:  FLONASE Place 2 sprays into both nostrils daily.   methimazole 10 MG tablet Commonly known as:  TAPAZOLE Take 2 tablets (20 mg total) by mouth daily.         OBJECTIVE:   PHYSICAL EXAM: VS: BP (!) 142/78 (BP Location: Left Arm, Patient Position: Sitting, Cuff Size: Normal)   Pulse (!) 103   Wt 150 lb 9.6 oz (68.3 kg)   SpO2 98%   BMI 25.85 kg/m    EXAM: General: Pt appears well and is in NAD  Hydration: Well-hydrated with moist mucous membranes and good skin turgor  Neck: General: Supple without adenopathy. Thyroid: Thyroid is prominent.  No goiter or nodules appreciated. No thyroid bruit.  Lungs: Clear with good BS bilat with no rales, rhonchi, or wheezes  Heart: Auscultation: Tachycardic.  Abdomen: Normoactive bowel sounds, soft, nontender, without masses or organomegaly palpable  Extremities:  BL LE: No pretibial edema  normal ROM and strength.  Neuro: Cranial nerves: II - XII grossly intact  DTRs: 2+ and symmetric in UE without delay in relaxation phase  Mental Status: Judgment, insight: Intact Orientation: Oriented to time, place, and person Mood and affect: No depression, anxiety, or agitation     DATA REVIEWED:  Results for ARELIA, PAS (MRN [id number]) as of 06/01/2018 12:38  Ref. Range 04/18/2018 15:58 05/09/2018 16:03 05/31/2018 16:40  TSH Latest Ref Range: 0.35 - 4.50 uIU/mL <0.01 (L) <0.01 (L) <0.01 (L)  Triiodothyronine (T3) Latest Ref Range: 76 - 181 ng/dL 865 (H)    H8,IONG(EXBMWU) Latest Ref Range: 0.60 - 1.60 ng/dL 1.32 (H) 4.40  (H) 1.02 (H)  Thyrotropin Receptor Ab Latest Ref Range: <=16.0 % 72.1 (H)       ASSESSMENT / PLAN / RECOMMENDATIONS:   1. Hyperthyroidism Secondary to Graves' Disease:   Patient is clinically hyperthyroid. Repeat thyroid function test today continues to show hyperthyroidism with some improvement, patient admits to noncompliance with taking the 2 tablets of methimazole at nighttime, we have extensively discussed the risk of uncontrolled hyperthyroidism and increasing risk of cardiac arrhythmia.  We discussed with pt the benefits of methimazole in the Tx of hyperthyroidism, as well as the possible side effects/complications of anti-thyroid drug Tx (specifically detailing the rare, but serious side effect of agranulocytosis). She was informed of need for regular thyroid function monitoring while on methimazole to ensure appropriate dosage without over-treatment. As well, we discussed the possible side effects of methimazole including the chance of rash, the small chance of liver irritation/juandice and the <=1 in 300-400 chance of sudden onset agranulocytosis.  We discussed importance of going to ED promptly (and stopping methimazole) if shewere to develop significant fever with severe sore throat of other evidence of acute infection.     We extensively discussed the various treatment options for hyperthyroidism and Graves disease including ablation therapy with radioactive iodine versus antithyroid drug treatment versus surgical therapy.  We recommended to the patient that we felt, at this time, Radioactive iodine ablation . We also discussed that with radioactive iodine ablation, she will need to be on lifelong LT for replacement therapy. Patient would like to avoid radioactive iodine ablation at this time, she is motivated to take methimazole as prescribed 2 tablets twice a day, patient assured me she will start using a timer on her phone.    Medications  Methimazole 20 mg twice daily recheck  TFT in 3 weeks   2. Graves' Disease:   - No extrathyroidal manifestations of Graves' disease.  Follow-up in 8 weeks   Left a message for the patient about improved results but still high free T4, I have encouraged her to continue taking methimazole 2 tablets twice a day until her next lab appointment in 3 weeks on 06/01/2018, at 12:30 PM.   Signed electronically by: Lyndle Herrlich, MD  [facility]  [facility] [address] [place], [place] [address] Phone: [phone] FAX: [phone]      CC: Deeann Saint, MD [address] [place] [address] Phone: [phone]  Fax: [phone]   Return to Endocrinology clinic as below: No future appointments.

## 2018-06-01 LAB — T4, FREE: Free T4: 2.95 ng/dL — ABNORMAL HIGH (ref 0.60–1.60)

## 2018-06-01 LAB — TSH: TSH: <0.01 u[IU]/mL — ABNORMAL LOW (ref 0.35–4.50)

## 2018-06-23 ENCOUNTER — Other Ambulatory Visit (INDEPENDENT_AMBULATORY_CARE_PROVIDER_SITE_OTHER): Payer: 59

## 2018-06-23 DIAGNOSIS — E0500 Thyrotoxicosis with diffuse goiter without thyrotoxic crisis or storm: Secondary | ICD-10-CM

## 2018-06-23 LAB — T4, FREE: FREE T4: 1.58 ng/dL (ref 0.60–1.60)

## 2018-06-23 LAB — TSH

## 2018-06-24 ENCOUNTER — Encounter: Payer: Self-pay | Admitting: Internal Medicine

## 2018-08-19 ENCOUNTER — Other Ambulatory Visit: Payer: Self-pay

## 2018-08-19 ENCOUNTER — Encounter: Payer: Self-pay | Admitting: General Practice

## 2018-08-19 ENCOUNTER — Encounter: Payer: 59 | Admitting: Internal Medicine

## 2018-08-19 NOTE — Progress Notes (Deleted)
Name: Sheri Foster. Hora  MRN/ DOB: [id number], [birth year]    Age/ Sex: 23 y.o., female     PCP: Deeann Saint, MD   Reason for Endocrinology Evaluation: Luiz Blare' Disease      Initial Endocrinology Clinic Visit: 04/18/2018    PATIENT IDENTIFIER: Sheri Foster is a 23 y.o., female with a past medical history of eczema and migraine headaches . She has followed with [facility] since 04/18/18 for consultative assistance with management of her Graves' Disease   HISTORICAL SUMMARY:  As per patient she presented to seek medical attention with URI symptoms in October, 2019, that's when she noted to have an enlarged thyroid. She denies having any pain at the time. TFT's showed suppressed  TSH (<0.01 uIu/mL) and elevated FT4 (4.44 ng/dL )   Methimazole was started in 03/2018 with compliance issues.    Works for [employer] and [employer]  SUBJECTIVE:   During last visit (05/31/2018): FT4 was improving but continued elevation   Today (08/19/2018):  Sheri Foster is here for her 6 week follow up on graves' disease. In January we have advised her to increase her Methiamzole to 20 mg BID due to continued FT4 elevation. Patient states she has not been very compliant with her evening dose.    Her weight has been stable, she denies any local neck symptoms.  Has heat intolerance and palpitations  Denies diarrhea.   ROS:  As per HPI.   HISTORY:  Past Medical History:  Past Medical History:  Diagnosis Date  . Depression   . Migraines     Past Surgical History: No past surgical history on file.  Social History:  reports that she has never smoked. She has never used smokeless tobacco. She reports current alcohol use. She reports that she does not use drugs.  Family History: family history includes Asthma in her mother; Hyperlipidemia in her mother; Hypertension in her mother; Hyperthyroidism in her mother; Hypothyroidism in an other family member.   HOME MEDICATIONS:  Allergies as of 08/19/2018   No Known Allergies     Medication List       Accurate as of August 19, 2018 12:36 PM. Always use your most recent med list.        atenolol 50 MG tablet Commonly known as:  TENORMIN TK 1 T PO D   methimazole 10 MG tablet Commonly known as:  TAPAZOLE Take 2 tablets (20 mg total) by mouth 2 (two) times daily.         OBJECTIVE:   PHYSICAL EXAM: VS: There were no vitals taken for this visit.   EXAM: General: Pt appears well and is in NAD  Hydration: Well-hydrated with moist mucous membranes and good skin turgor  Neck: General: Supple without adenopathy. Thyroid: Thyroid is prominent.  No goiter or nodules appreciated. No thyroid bruit.  Lungs: Clear with good BS bilat with no rales, rhonchi, or wheezes  Heart: Auscultation: Tachycardic.  Abdomen: Normoactive bowel sounds, soft, nontender, without masses or organomegaly palpable  Extremities:  BL LE: No pretibial edema normal ROM and strength.  Neuro: Cranial nerves: II - XII grossly intact  DTRs: 2+ and symmetric in UE without delay in relaxation phase  Mental Status: Judgment, insight: Intact Orientation: Oriented to time, place, and person Mood and affect: No depression, anxiety, or agitation     DATA REVIEWED:  Results for Sheri Foster, Sheri Foster (MRN [id number]) as of 06/01/2018 12:38  Ref. Range 04/18/2018 15:58 05/09/2018 16:03 05/31/2018 16:40  TSH  Latest Ref Range: 0.35 - 4.50 uIU/mL <0.01 (L) <0.01 (L) <0.01 (L)  Triiodothyronine (T3) Latest Ref Range: 76 - 181 ng/dL 161777 (H)    W9,UEAV(WUJWJXT4,Free(Direct) Latest Ref Range: 0.60 - 1.60 ng/dL 9.144.81 (H) 7.824.41 (H) 9.562.95 (H)  Thyrotropin Receptor Ab Latest Ref Range: <=16.0 % 72.1 (H)       ASSESSMENT / PLAN / RECOMMENDATIONS:   1. Hyperthyroidism Secondary to Graves' Disease:   Patient is clinically hyperthyroid. Repeat thyroid function test today continues to show hyperthyroidism with some improvement, patient admits to noncompliance with taking the  2 tablets of methimazole at nighttime, we have extensively discussed the risk of uncontrolled hyperthyroidism and increasing risk of cardiac arrhythmia.  We discussed with pt the benefits of methimazole in the Tx of hyperthyroidism, as well as the possible side effects/complications of anti-thyroid drug Tx (specifically detailing the rare, but serious side effect of agranulocytosis). She was informed of need for regular thyroid function monitoring while on methimazole to ensure appropriate dosage without over-treatment. As well, we discussed the possible side effects of methimazole including the chance of rash, the small chance of liver irritation/juandice and the <=1 in 300-400 chance of sudden onset agranulocytosis.  We discussed importance of going to ED promptly (and stopping methimazole) if shewere to develop significant fever with severe sore throat of other evidence of acute infection.     We extensively discussed the various treatment options for hyperthyroidism and Graves disease including ablation therapy with radioactive iodine versus antithyroid drug treatment versus surgical therapy.  We recommended to the patient that we felt, at this time, Radioactive iodine ablation . We also discussed that with radioactive iodine ablation, she will need to be on lifelong LT for replacement therapy. Patient would like to avoid radioactive iodine ablation at this time, she is motivated to take methimazole as prescribed 2 tablets twice a day, patient assured me she will start using a timer on her phone.    Medications  Methimazole 20 mg twice daily recheck TFT in 3 weeks   2. Graves' Disease:   - No extrathyroidal manifestations of Graves' disease.  Follow-up in 8 weeks   Left a message for the patient about improved results but still high free T4, I have encouraged her to continue taking methimazole 2 tablets twice a day until her next lab appointment in 3 weeks on 06/01/2018, at 12:30 PM.    Signed electronically by: Lyndle HerrlichAbby Jaralla Kathyleen Radice, MD  [facility]  [facility] [address] [place], [place] [address] Phone: [phone] FAX: [phone]      CC: Deeann SaintBanks, Shannon R, MD [address] [place] [address] Phone: [phone]  Fax: [phone]   Return to Endocrinology clinic as below: Future Appointments  Date Time Provider Department Center  08/19/2018  3:40 PM Chris Narasimhan, Konrad DoloresIbtehal Jaralla, MD [id number] None

## 2018-12-20 ENCOUNTER — Other Ambulatory Visit: Payer: Self-pay

## 2018-12-20 ENCOUNTER — Ambulatory Visit (INDEPENDENT_AMBULATORY_CARE_PROVIDER_SITE_OTHER): Payer: 59 | Admitting: Internal Medicine

## 2018-12-20 ENCOUNTER — Encounter: Payer: Self-pay | Admitting: Internal Medicine

## 2018-12-20 VITALS — BP 128/86 | HR 92 | Temp 99.0°F | Ht 64.0 in | Wt 158.8 lb

## 2018-12-20 DIAGNOSIS — E0500 Thyrotoxicosis with diffuse goiter without thyrotoxic crisis or storm: Secondary | ICD-10-CM

## 2018-12-20 LAB — TSH: TSH: <0.01 u[IU]/mL — ABNORMAL LOW (ref 0.35–4.50)

## 2018-12-20 LAB — T4, FREE: Free T4: 2.09 ng/dL — ABNORMAL HIGH (ref 0.60–1.60)

## 2018-12-20 NOTE — Patient Instructions (Signed)
We recommend that you follow these hyperthyroidism instructions at home:  1) Take Methimazole 10 mg, 1 tab twice a day   If you develop severe sore throat with high fevers OR develop unexplained yellowing of your skin, eyes, under your tongue, severe abdominal pain with nausea or vomiting --> then please get evaluated immediately.  2) Atenolol 100 mg one time a day 3) Get repeat thyroid labs in 6 weeks.   It is ESSENTIAL to get follow-up labs to help avoid over or undertreatment of your hyperthyroidism - both of which can be dangerous to your health.

## 2018-12-20 NOTE — Progress Notes (Signed)
Name: Sheri Foster. Maino  MRN/ DOB: [id number], [birth year]    Age/ Sex: 23 y.o., female     PCP: Billie Ruddy, MD   Reason for Endocrinology Evaluation: Berenice Primas' Disease      Initial Endocrinology Clinic Visit: 04/18/2018    PATIENT IDENTIFIER: Sheri Foster is a 23 y.o., female with a past medical history of eczema and migraine headaches . She has followed with [facility] since 04/18/18 for consultative assistance with management of her Graves' Disease   HISTORICAL SUMMARY:  As per patient she presented to seek medical attention with URI symptoms in October, 2019, that's when she noted to have an enlarged thyroid. She denies having any pain at the time. TFT's shows low TSH (<0.01 uIu/mL) and elevated FT4 (4.44 ng/dL )   Methimazole was started in 03/2018   Works for [employer] and [employer]  SUBJECTIVE:   During last visit (05/31/18): Non-compliant with methimazole,we continued the dose.   Today (12/20/2018):  Sheri Foster is here for a follow up on graves' disease. She has not been here in  6 months.  She has been taking 1 tablet of methimazole BID instead of 2 tabs BID    Her weight has has increased  she denies any local neck symptoms.  Denies palpitations Denies diarrhea or nausea   ROS:  As per HPI.   HISTORY:  Past Medical History:  Past Medical History:  Diagnosis Date  . Depression   . Migraines     Past Surgical History: No past surgical history on file.  Social History:  reports that she has never smoked. She has never used smokeless tobacco. She reports current alcohol use. She reports that she does not use drugs.  Family History: family history includes Asthma in her mother; Hyperlipidemia in her mother; Hypertension in her mother; Hyperthyroidism in her mother; Hypothyroidism in an other family member.   HOME MEDICATIONS: Allergies as of 12/20/2018   No Known Allergies     Medication List       Accurate as of December 20, 2018  11:25 AM. If you have any questions, ask your nurse or doctor.        atenolol 50 MG tablet Commonly known as: TENORMIN TK 1 T PO D   methimazole 10 MG tablet Commonly known as: TAPAZOLE Take 2 tablets (20 mg total) by mouth 2 (two) times daily.         OBJECTIVE:   PHYSICAL EXAM: VS: BP 128/86 (BP Location: Left Arm, Patient Position: Sitting, Cuff Size: Normal)   Pulse 92   Temp 99 F (37.2 C)   Ht 5\' 4"  (1.626 m)   Wt 158 lb 12.8 oz (72 kg)   SpO2 98%   BMI 27.26 kg/m    EXAM: General: Pt appears well and is in NAD  Hydration: Well-hydrated with moist mucous membranes and good skin turgor  Neck: General: Supple without adenopathy. Thyroid: Thyroid is prominent.  No goiter or nodules appreciated. No thyroid bruit.  Lungs: Clear with good BS bilat with no rales, rhonchi, or wheezes  Heart: Auscultation: Tachycardic.  Abdomen: Normoactive bowel sounds, soft, nontender, without masses or organomegaly palpable  Extremities:  BL LE: No pretibial edema normal ROM and strength.  Neuro: Cranial nerves: II - XII grossly intact  DTRs: 2+ and symmetric in UE without delay in relaxation phase  Mental Status: Judgment, insight: Intact Orientation: Oriented to time, place, and person Mood and affect: No depression, anxiety, or agitation  DATA REVIEWED:  Results for Sheri Foster, Sheri T. (MRN [id number]) as of 12/21/2018 08:48  Ref. Range 12/20/2018 11:42  TSH Latest Ref Range: 0.35 - 4.50 uIU/mL <0.01 (L)  T4,Free(Direct) Latest Ref Range: 0.60 - 1.60 ng/dL 4.092.09 (H)      ASSESSMENT / PLAN / RECOMMENDATIONS:   1. Hyperthyroidism Secondary to Graves' Disease:   - Patient is clinically hyperthyroid. - Repeat thyroid function test today continues to show hyperthyroidism this is again due to non-compliance. We have extensively discussed the risk of uncontrolled hyperthyroidism and increasing risk of cardiac arrhythmia which will lead to CHF and CVA, as well as the risk of  osteoporosis.   We discussed with pt the benefits of methimazole in the Tx of hyperthyroidism, as well as the possible side effects/complications of anti-thyroid drug Tx (specifically detailing the rare, but serious side effect of agranulocytosis). She was informed of need for regular thyroid function monitoring while on methimazole to ensure appropriate dosage without over-treatment. As well, we discussed the possible side effects of methimazole including the chance of rash, the small chance of liver irritation/juandice and the <=1 in 300-400 chance of sudden onset agranulocytosis.  We discussed importance of going to ED promptly (and stopping methimazole) if shewere to develop significant fever with severe sore throat of other evidence of acute infection.     We extensively discussed the various treatment options for hyperthyroidism and Graves disease including ablation therapy with radioactive iodine versus antithyroid drug treatment versus surgical therapy.  We recommended to the patient that we felt, at this time, Radioactive iodine ablation . We also discussed that with radioactive iodine ablation, she will need to be on lifelong LT for replacement therapy. Patient would like to avoid radioactive iodine ablation at this time, she is motivated to take methimazole as prescribed 2 tablets twice a day, patient assured me she will start using a timer on her phone.    Medications  Methimazole 20 mg twice daily    2. Graves' Disease:   - No extrathyroidal manifestations of Graves' disease. - Pt encouraged to have an eye exam      Follow-up in 3 months  Labs in 3 weeks    Signed electronically by: Lyndle HerrlichAbby Jaralla Shamleffer, MD  [facility]  [facility] [address] [place], [place] [address] Phone: [phone] FAX: [phone]      CC: Deeann SaintBanks, Shannon R, MD [address] [place] [address] Phone: [phone]  Fax: [phone]    Return to Endocrinology clinic as below: No future appointments.

## 2018-12-21 ENCOUNTER — Encounter: Payer: Self-pay | Admitting: Internal Medicine

## 2018-12-21 MED ORDER — METHIMAZOLE 10 MG PO TABS: 20.0000 mg | ORAL_TABLET | Freq: Two times a day (BID) | ORAL | 6 refills | Status: DC

## 2018-12-24 ENCOUNTER — Encounter: Payer: Self-pay | Admitting: Internal Medicine

## 2019-01-11 ENCOUNTER — Other Ambulatory Visit: Payer: Self-pay

## 2019-01-11 ENCOUNTER — Encounter (HOSPITAL_COMMUNITY): Payer: Self-pay | Admitting: Emergency Medicine

## 2019-01-11 ENCOUNTER — Emergency Department (HOSPITAL_COMMUNITY)
Admission: EM | Admit: 2019-01-11 | Discharge: 2019-01-11 | Disposition: A | Discharge status: Home / Self Care | Payer: 59 | Attending: Emergency Medicine | Admitting: Emergency Medicine

## 2019-01-11 ENCOUNTER — Emergency Department (HOSPITAL_COMMUNITY): Payer: 59

## 2019-01-11 DIAGNOSIS — R0789 Other chest pain: Secondary | ICD-10-CM | POA: Diagnosis not present

## 2019-01-11 DIAGNOSIS — I10 Essential (primary) hypertension: Secondary | ICD-10-CM | POA: Insufficient documentation

## 2019-01-11 DIAGNOSIS — R079 Chest pain, unspecified: Secondary | ICD-10-CM

## 2019-01-11 LAB — CBC
HCT: 39.2 % (ref 36.0–46.0)
Hemoglobin: 12.3 g/dL (ref 12.0–15.0)
MCH: 25.6 pg — ABNORMAL LOW (ref 26.0–34.0)
MCHC: 31.4 g/dL (ref 30.0–36.0)
MCV: 81.5 fL (ref 80.0–100.0)
Platelets: 281 10*3/uL (ref 150–400)
RBC: 4.81 MIL/uL (ref 3.87–5.11)
RDW: 13.3 % (ref 11.5–15.5)
WBC: 4.6 10*3/uL (ref 4.0–10.5)
nRBC: 0 % (ref 0.0–0.2)

## 2019-01-11 LAB — BASIC METABOLIC PANEL
Anion gap: 9 (ref 5–15)
BUN: <5 mg/dL — ABNORMAL LOW (ref 6–20)
CO2: 22 mmol/L (ref 22–32)
Calcium: 9.2 mg/dL (ref 8.9–10.3)
Chloride: 106 mmol/L (ref 98–111)
Creatinine, Ser: 0.57 mg/dL (ref 0.44–1.00)
GFR calc Af Amer: >60 mL/min (ref >60)
GFR calc non Af Amer: >60 mL/min (ref >60)
Glucose, Bld: 95 mg/dL (ref 70–99)
Potassium: 4 mmol/L (ref 3.5–5.1)
Sodium: 137 mmol/L (ref 135–145)

## 2019-01-11 LAB — TROPONIN I (HIGH SENSITIVITY)
Troponin I (High Sensitivity): <2 ng/L (ref <18)
Troponin I (High Sensitivity): <2 ng/L (ref <18)

## 2019-01-11 LAB — I-STAT BETA HCG BLOOD, ED (MC, WL, AP ONLY): I-stat hCG, quantitative: <5 m[IU]/mL (ref <5)

## 2019-01-11 MED ORDER — KETOROLAC TROMETHAMINE 30 MG/ML IJ SOLN
30.0000 mg | Freq: Once | INTRAMUSCULAR | Status: AC
  Administered 2019-01-11: 30 mg via INTRAVENOUS
  Filled 2019-01-11: qty 1

## 2019-01-11 MED ORDER — NAPROXEN 500 MG PO TABS: 500.0000 mg | ORAL_TABLET | Freq: Two times a day (BID) | ORAL | 0 refills | Status: DC | PRN

## 2019-01-11 MED ORDER — SODIUM CHLORIDE 0.9% FLUSH: 3.0000 mL | Freq: Once | INTRAVENOUS | Status: DC

## 2019-01-11 NOTE — ED Notes (Signed)
Got patient into a gown on the monitor patient is resting with call bell in reach 

## 2019-01-11 NOTE — ED Triage Notes (Signed)
Pt presents with consistent chest pain on the left chest with radiation to the back since last night. She reports hx of Graves disease and takes 50 mg metoprolol (No meds taken today) When she woke up she felt out of it and noticed the pain was still there. Pain is on palpation. Denies shortness or breath, N/V/D or fever.

## 2019-01-11 NOTE — Discharge Instructions (Signed)
It was my pleasure taking care of you today!   Call your primary care doctor tomorrow to schedule follow-up appointment.  Return to ER for new or worsening symptoms, any additional concerns.

## 2019-01-11 NOTE — ED Provider Notes (Signed)
[facility] EMERGENCY DEPARTMENT Provider Note   CSN: [id number] Arrival date & time: 01/11/19  1045     History   Chief Complaint Chief Complaint  Patient presents with  . Chest Pain    HPI Ashelyn T. Cousin is a 23 y.o. female.     The history is provided by the patient and medical records. No language interpreter was used.  Chest Pain  Shalise T. Reach is a 23 y.o. female  with a PMH of  Grave's disease who presents to the Emergency Department complaining of central to left-sided, nonradiating squeezing, sharp, constant chest pain which began last night.  Patient states she has felt similar pain in the past when her heart rate is elevated and typically will take a dose of her atenolol and it will improve.  He took a dose last night, but did not have any improvement in her symptoms.  Denies shortness of breath, nausea, vomiting, diarrhea, fever, cough, urinary symptoms, back pain or leg swelling / pain.   Past Medical History:  Diagnosis Date  . Depression   . Migraines     Patient Active Problem List   Diagnosis Date Noted  . Graves disease 05/31/2018  . Hyperthyroidism 05/31/2018  . Eczema 01/27/2018    History reviewed. No pertinent surgical history.   OB History   No obstetric history on file.      Home Medications    Prior to Admission medications   Medication Sig Start Date End Date Taking? Authorizing Provider  atenolol (TENORMIN) 50 MG tablet TK 1 T PO D 07/18/18   [provider]  methimazole (TAPAZOLE) 10 MG tablet Take 2 tablets (20 mg total) by mouth 2 (two) times daily. 12/21/18   Shamleffer, Melanie Crazier, MD  naproxen (NAPROSYN) 500 MG tablet Take 1 tablet (500 mg total) by mouth 2 (two) times daily as needed for mild pain or moderate pain. 01/11/19   , Ozella Almond, PA-C    Family History Family History  Problem Relation Age of Onset  . Asthma Mother   . Hyperlipidemia Mother   . Hypertension Mother   .  Hyperthyroidism Mother   . Hypothyroidism Other     Social History Social History   Tobacco Use  . Smoking status: Never Smoker  . Smokeless tobacco: Never Used  Substance Use Topics  . Alcohol use: Yes  . Drug use: Never     Allergies   Patient has no known allergies.   Review of Systems Review of Systems  Cardiovascular: Positive for chest pain.  All other systems reviewed and are negative.    Physical Exam Updated Vital Signs BP (!) 140/102   Pulse (!) 58   Temp 98.3 F (36.8 C) (Oral)   Resp 19   LMP 12/23/2018 (Approximate)   SpO2 97%   Physical Exam Vitals signs and nursing note reviewed.  Constitutional:      General: She is not in acute distress.    Appearance: She is well-developed.  HENT:     Head: Normocephalic and atraumatic.  Neck:     Musculoskeletal: Neck supple.  Cardiovascular:     Rate and Rhythm: Normal rate and regular rhythm.     Heart sounds: Normal heart sounds. No murmur.  Pulmonary:     Effort: Pulmonary effort is normal. No respiratory distress.     Breath sounds: Normal breath sounds. No wheezing or rales.  Chest:     Chest wall: Tenderness present.  Abdominal:  General: There is no distension.     Palpations: Abdomen is soft.     Tenderness: There is no abdominal tenderness.  Skin:    General: Skin is warm and dry.  Neurological:     Mental Status: She is alert and oriented to person, place, and time.      ED Treatments / Results  Labs (all labs ordered are listed, but only abnormal results are displayed) Labs Reviewed  BASIC METABOLIC PANEL - Abnormal; Notable for the following components:      Result Value   BUN <5 (*)    All other components within normal limits  CBC - Abnormal; Notable for the following components:   MCH 25.6 (*)    All other components within normal limits  I-STAT BETA HCG BLOOD, ED ([facility], [facility], [facility] ONLY)  TROPONIN I (HIGH SENSITIVITY)  TROPONIN I (HIGH SENSITIVITY)    EKG EKG  Interpretation  Date/Time:  Wednesday January 11 2019 11:02:11 EDT Ventricular Rate:  66 PR Interval:  140 QRS Duration: 70 QT Interval:  430 QTC Calculation: 450 R Axis:   74 Text Interpretation:  Normal sinus rhythm Normal ECG similar to prior 9/11 Confirmed by Meridee ScoreButler, Michael [id number]) on 01/11/2019 11:40:19 AM   Radiology Dg Chest 2 View  Result Date: 01/11/2019 CLINICAL DATA:  Chest pain, shortness of breath EXAM: CHEST - 2 VIEW COMPARISON:  None. FINDINGS: The heart size and mediastinal contours are within normal limits. Both lungs are clear. The visualized skeletal structures are unremarkable. IMPRESSION: No acute abnormality of the lungs. Electronically Signed   By: Lauralyn PrimesAlex  Bibbey M.D.   On: 01/11/2019 11:39    Procedures Procedures (including critical care time)  Medications Ordered in ED Medications  sodium chloride flush (NS) 0.9 % injection 3 mL (has no administration in time range)  ketorolac (TORADOL) 30 MG/ML injection 30 mg (30 mg Intravenous Given 01/11/19 1410)     Initial Impression / Assessment and Plan / ED Course  I have reviewed the triage vital signs and the nursing notes.  Pertinent labs & imaging results that were available during my care of the patient were reviewed by me and considered in my medical decision making (see chart for details).        Mella T. Salomon FickBanks is a 23 y.o. female who presents to ED for chest pain beginning last night. On exam, patient is afebrile, hemodynamically stable with reproducible tenderness to chest wall.  Clear lung exam.  Labs reviewed and reassuring with negative troponin x2.  CXR with no acute abnormalities.  EKG NSR.  Low risk Heart score 0. PERC negative.  Patient's symptoms unlikely to be of cardiac etiology. Labs and imaging reviewed again prior to discharge. Patient has been advised to return to the ED if development of any exertional chest pain, trouble breathing, new/worsening symptoms or for any additional  concerns. Evaluation does not show pathology that would require ongoing emergent intervention or inpatient treatment. Encouraged to follow up with PCP. Referral information included in discharge paperwork. Patient understands return precautions and follow up plan. All questions answered.  Final Clinical Impressions(s) / ED Diagnoses   Final diagnoses:  Chest pain with low risk for cardiac etiology    ED Discharge Orders         Ordered    naproxen (NAPROSYN) 500 MG tablet  2 times daily PRN     01/11/19 1454           , Chase PicketJaime Pilcher, PA-C 01/11/19 1513  Terrilee Files, MD 01/11/19 1729

## 2019-01-31 ENCOUNTER — Other Ambulatory Visit (INDEPENDENT_AMBULATORY_CARE_PROVIDER_SITE_OTHER): Payer: 59

## 2019-01-31 ENCOUNTER — Other Ambulatory Visit: Payer: Self-pay

## 2019-01-31 DIAGNOSIS — E0500 Thyrotoxicosis with diffuse goiter without thyrotoxic crisis or storm: Secondary | ICD-10-CM | POA: Diagnosis not present

## 2019-02-01 LAB — TSH: TSH: <0.01 u[IU]/mL — ABNORMAL LOW (ref 0.35–4.50)

## 2019-02-01 LAB — T4, FREE: Free T4: 1.23 ng/dL (ref 0.60–1.60)

## 2019-02-02 ENCOUNTER — Ambulatory Visit: Payer: 59 | Admitting: Family Medicine

## 2019-02-02 DIAGNOSIS — Z0289 Encounter for other administrative examinations: Secondary | ICD-10-CM

## 2019-03-02 ENCOUNTER — Encounter (HOSPITAL_COMMUNITY): Payer: Self-pay | Admitting: Emergency Medicine

## 2019-03-02 ENCOUNTER — Other Ambulatory Visit: Payer: Self-pay

## 2019-03-02 ENCOUNTER — Emergency Department (HOSPITAL_COMMUNITY)
Admission: EM | Admit: 2019-03-02 | Discharge: 2019-03-02 | Disposition: A | Discharge status: Home / Self Care | Payer: 59 | Attending: Emergency Medicine | Admitting: Emergency Medicine

## 2019-03-02 DIAGNOSIS — K047 Periapical abscess without sinus: Secondary | ICD-10-CM | POA: Diagnosis not present

## 2019-03-02 DIAGNOSIS — Z79899 Other long term (current) drug therapy: Secondary | ICD-10-CM | POA: Diagnosis not present

## 2019-03-02 DIAGNOSIS — E079 Disorder of thyroid, unspecified: Secondary | ICD-10-CM | POA: Diagnosis not present

## 2019-03-02 DIAGNOSIS — K0889 Other specified disorders of teeth and supporting structures: Secondary | ICD-10-CM | POA: Diagnosis present

## 2019-03-02 MED ORDER — CLINDAMYCIN HCL 150 MG PO CAPS: 450.0000 mg | ORAL_CAPSULE | Freq: Three times a day (TID) | ORAL | 0 refills | Status: AC

## 2019-03-02 MED ORDER — LIDOCAINE-EPINEPHRINE (PF) 2 %-1:200000 IJ SOLN
10.0000 mL | Freq: Once | INTRAMUSCULAR | Status: AC
  Administered 2019-03-02: 10 mL
  Filled 2019-03-02: qty 20

## 2019-03-02 MED ORDER — MELOXICAM 10 MG PO CAPS: 10.0000 mg | ORAL_CAPSULE | Freq: Every day | ORAL | 0 refills | Status: DC | PRN

## 2019-03-02 NOTE — Discharge Instructions (Addendum)
You were seen in the emergency department today and found to have a dental abscess which was incised and drained in the emergency department. We are sending home with clindamycin, an antibiotic, please take this as prescribed.  We are also sending you home with meloxicam to help with pain. -Meloxicam is a nonsteroidal anti-inflammatory medication that will help with pain and swelling. Be sure to take this medication as prescribed with food, 1 pill every 12 hours,  It should be taken with food, as it can cause stomach upset, and more seriously, stomach bleeding. Do not take other nonsteroidal anti-inflammatory medications with this such as Advil, Motrin, Aleve, Mobic, naproxen Goodie Powder, or Motrin.    You make take Tylenol per over the counter dosing with these medications.   We have prescribed you new medication(s) today. Discuss the medications prescribed today with your pharmacist as they can have adverse effects and interactions with your other medicines including over the counter and prescribed medications. Seek medical evaluation if you start to experience new or abnormal symptoms after taking one of these medicines, seek care immediately if you start to experience difficulty breathing, feeling of your throat closing, facial swelling, or rash as these could be indications of a more serious allergic reaction  You may apply warm compresses to the outer aspect of your jaw 4-6 times per day for about 15 minutes.  We would like you to follow-up closely with your oral surgeon as scheduled on Tuesday.  Return to the emergency department immediately for new or worsening symptoms including but not limited to increased pain, increased swelling, inability to swallow, trouble swallowing, trouble moving your neck, swelling/pain beneath her tongue, fever, or any other concerns.

## 2019-03-02 NOTE — ED Provider Notes (Signed)
[facility] EMERGENCY DEPARTMENT Provider Note   CSN: [id number] Arrival date & time: 03/02/19  1629     History   Chief Complaint Chief Complaint  Patient presents with  . Dental Pain    HPI Sheri Foster is a 23 y.o. female with a hx of depression & migraines who presents to the emergency department with complaints of right lower dental pain and facial swelling after having her wisdom teeth removed 1 week prior.  Patient states immediately after the procedure she noted some pain and swelling to the right lower jaw.  Initially this seemed to be typical swelling after the surgery which seem to be getting better, however then seem to worsen over the past few days.  States pain is constant mildly alleviated by ice application otherwise no alleviating or aggravating factors.  She called her oral surgeon who recommended she come to the emergency department for further evaluation.  They did not see her in the clinic prior to emergency department referral.  She denies fever, chills, dysphagia, neck stiffness, vomiting, or dyspnea.     HPI  Past Medical History:  Diagnosis Date  . Depression   . Migraines     Patient Active Problem List   Diagnosis Date Noted  . Graves disease 05/31/2018  . Hyperthyroidism 05/31/2018  . Eczema 01/27/2018    History reviewed. No pertinent surgical history.   OB History   No obstetric history on file.      Home Medications    Prior to Admission medications   Medication Sig Start Date End Date Taking? Authorizing Provider  atenolol (TENORMIN) 50 MG tablet TK 1 T PO D 07/18/18   [provider]  methimazole (TAPAZOLE) 10 MG tablet Take 2 tablets (20 mg total) by mouth 2 (two) times daily. 12/21/18   Shamleffer, Konrad Dolores, MD  naproxen (NAPROSYN) 500 MG tablet Take 1 tablet (500 mg total) by mouth 2 (two) times daily as needed for mild pain or moderate pain. 01/11/19   Ward, Chase Picket, PA-C    Family  History Family History  Problem Relation Age of Onset  . Asthma Mother   . Hyperlipidemia Mother   . Hypertension Mother   . Hyperthyroidism Mother   . Hypothyroidism Other     Social History Social History   Tobacco Use  . Smoking status: Never Smoker  . Smokeless tobacco: Never Used  Substance Use Topics  . Alcohol use: Yes  . Drug use: Never     Allergies   Patient has no known allergies.   Review of Systems Review of Systems  Constitutional: Negative for chills and fever.  HENT: Positive for dental problem and facial swelling. Negative for trouble swallowing and voice change.   Respiratory: Negative for shortness of breath.   Cardiovascular: Negative for chest pain.  Gastrointestinal: Negative for abdominal pain, nausea and vomiting.  Musculoskeletal: Negative for neck stiffness.  All other systems reviewed and are negative.    Physical Exam Updated Vital Signs BP (!) 144/105 (BP Location: Left Arm)   Pulse 82   Temp 98.8 F (37.1 C) (Oral)   Resp 14   SpO2 100%   Physical Exam Vitals signs and nursing note reviewed.  Constitutional:      General: She is not in acute distress.    Appearance: She is well-developed. She is not toxic-appearing.  HENT:     Head: Normocephalic and atraumatic.      Right Ear: Tympanic membrane is not perforated, erythematous,  retracted or bulging.     Left Ear: Tympanic membrane is not perforated, erythematous, retracted or bulging.     Nose: Nose normal.     Mouth/Throat:     Pharynx: Uvula midline. No oropharyngeal exudate, posterior oropharyngeal erythema or uvula swelling.     Tonsils: No tonsillar abscesses.      Comments: Posterior oropharynx is symmetric appearing. Patient tolerating own secretions without difficulty. No trismus. No drooling. No hot potato voice. No swelling beneath the tongue, submandibular compartment is soft.  Eyes:     General:        Right eye: No discharge.        Left eye: No discharge.      Conjunctiva/sclera: Conjunctivae normal.  Neck:     Musculoskeletal: Normal range of motion and neck supple. No edema, erythema, neck rigidity, crepitus or pain with movement.  Cardiovascular:     Rate and Rhythm: Normal rate and regular rhythm.  Pulmonary:     Effort: Pulmonary effort is normal.     Breath sounds: Normal breath sounds.  Lymphadenopathy:     Cervical: No cervical adenopathy.  Neurological:     Mental Status: She is alert.  Psychiatric:        Behavior: Behavior normal.        Thought Content: Thought content normal.      ED Treatments / Results  Labs (all labs ordered are listed, but only abnormal results are displayed) Labs Reviewed - No data to display  EKG None  Radiology No results found.  Procedures .Marland KitchenIncision and Drainage  Date/Time: 03/02/2019 7:02 PM Performed by: Amaryllis Dyke, PA-C Authorized by: Amaryllis Dyke, PA-C   Consent:    Consent obtained:  Verbal   Consent given by:  Patient and parent   Risks discussed:  Bleeding, infection, incomplete drainage and pain   Alternatives discussed:  No treatment Location:    Type:  Abscess   Location: dental, R lower jaw. Anesthesia (see MAR for exact dosages):    Anesthesia method:  Local infiltration   Local anesthetic:  Lidocaine 2% WITH epi Procedure type:    Complexity:  Simple Procedure details:    Needle aspiration: yes     Needle size:  18 G   Incision types:  Stab incision   Scalpel blade:  11   Wound management:  Irrigated with saline   Drainage:  Bloody and purulent   Drainage amount:  Moderate   Wound treatment:  Wound left open   Packing materials:  None Post-procedure details:    Patient tolerance of procedure:  Tolerated well, no immediate complications   (including critical care time)  Medications Ordered in ED Medications - No data to display   Initial Impression / Assessment and Plan / ED Course  I have reviewed the triage vital signs and the  nursing notes.  Pertinent labs & imaging results that were available during my care of the patient were reviewed by me and considered in my medical decision making (see chart for details).   Patient presents to the emergency department with complaints of right lower jaw pain/swelling after wisdom tooth removal 1 week prior.  She is nontoxic-appearing, resting comfortably, vitals WNL with exception of elevated blood pressure, doubt HTN emergency.  Exam is concerning for dental abscess.  Exam does not seem consistent with RPA/PTA or Ludwigs angina. I&D performed per procedure note above with symptomatic relief & improvement in physical exam.  Will discharge patient home with clindamycin and meloxicam  with close follow-up with her oral surgeon. I discussed treatment plan, need for follow-up, and return precautions with the patient and parent at bedside. Provided opportunity for questions, patient and parent confirmed understanding and are in agreement with plan.   Findings and plan of care discussed with supervising physician Dr. Denton LankSteinl who has evaluated patient & is in agreement.    Final Clinical Impressions(s) / ED Diagnoses   Final diagnoses:  Dental abscess    ED Discharge Orders         Ordered    clindamycin (CLEOCIN) 150 MG capsule  3 times daily     03/02/19 1856    Meloxicam 10 MG CAPS  Daily PRN     03/02/19 1856           Cherly Andersonetrucelli, Persia Lintner R, PA-C 03/02/19 1907    Cathren LaineSteinl, Kevin, MD 03/02/19 2255

## 2019-03-02 NOTE — ED Triage Notes (Signed)
Pt had her wisdom teeth pulled Thursday and the left side did fine , the right side of her mouth is still swollen and painful , called the dentist told to come to the ED nad , airway intact

## 2019-03-02 NOTE — ED Notes (Signed)
Pt verbalized understanding of discharge paperwork, follow-up care and prescriptions.  

## 2019-03-21 ENCOUNTER — Ambulatory Visit: Payer: 59 | Admitting: Internal Medicine

## 2019-03-21 NOTE — Progress Notes (Deleted)
Name: Avon Molock. 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 Sheri Foster is a 23 y.o., female with a past medical history of eczema and migraine headaches . She has followed with [facility] since 04/18/18 for consultative assistance with management of her Graves' Disease   HISTORICAL SUMMARY:  As per patient she presented to seek medical attention with URI symptoms in October, 2019, that's when she noted to have an enlarged thyroid. She denies having any pain at the time. TFT's shows low TSH (<0.01 uIu/mL) and elevated FT4 (4.44 ng/dL )   Methimazole was started in 03/2018   Works for [employer] and [employer]  SUBJECTIVE:   During last visit (12/19/18): We offered RAI ablation due to non-compliance with methimazole but declined   Today (03/21/2019):  Ms. Dusing is here for a follow up on graves' disease. She has not been here in  6 months.     Her weight has has increased  she denies any local neck symptoms.  Denies palpitations Denies diarrhea or nausea   Medications  Methimazole 20 mg BID    ROS:  As per HPI.   HISTORY:  Past Medical History:  Past Medical History:  Diagnosis Date  . Depression   . Migraines     Past Surgical History: No past surgical history on file.  Social History:  reports that she has never smoked. She has never used smokeless tobacco. She reports current alcohol use. She reports that she does not use drugs.  Family History: family history includes Asthma in her mother; Hyperlipidemia in her mother; Hypertension in her mother; Hyperthyroidism in her mother; Hypothyroidism in an other family member.   HOME MEDICATIONS: Allergies as of 03/21/2019   No Known Allergies     Medication List       Accurate as of March 21, 2019  12:00 PM. If you have any questions, ask your nurse or doctor.        atenolol 50 MG tablet Commonly known as: TENORMIN TK 1 T PO D   Meloxicam 10 MG Caps Take 10 mg by mouth daily as needed (pain/swelling).   methimazole 10 MG tablet Commonly known as: TAPAZOLE Take 2 tablets (20 mg total) by mouth 2 (two) times daily.   naproxen 500 MG tablet Commonly known as: NAPROSYN Take 1 tablet (500 mg total) by mouth 2 (two) times daily as needed for mild pain or moderate pain.         OBJECTIVE:   PHYSICAL EXAM: VS: There were no vitals taken for this visit.   EXAM: General: Pt appears well and is in NAD  Hydration: Well-hydrated with moist mucous membranes and good skin turgor  Neck: General: Supple without adenopathy. Thyroid: Thyroid is prominent.  No goiter or nodules appreciated. No thyroid bruit.  Lungs: Clear with good BS bilat with no rales, rhonchi, or wheezes  Heart: Auscultation: Tachycardic.  Abdomen: Normoactive bowel sounds, soft, nontender, without masses or organomegaly palpable  Extremities:  BL LE: No pretibial edema normal ROM and strength.  Neuro: Cranial nerves: II - XII grossly intact  DTRs: 2+ and symmetric in UE without delay in relaxation phase  Mental Status: Judgment, insight: Intact Orientation: Oriented to time, place, and person Mood and affect: No depression, anxiety,  or agitation     DATA REVIEWED: ***   ASSESSMENT / PLAN / RECOMMENDATIONS:   1. Hyperthyroidism Secondary to Graves' Disease:   - Patient is clinically hyperthyroid. - Repeat thyroid function test today continues to show hyperthyroidism this is again due to non-compliance. We have extensively discussed the risk of uncontrolled hyperthyroidism and increasing risk of cardiac arrhythmia which will lead to CHF and CVA, as well as the risk of osteoporosis.   We discussed with pt the benefits of methimazole in the Tx of hyperthyroidism, as well as the possible side  effects/complications of anti-thyroid drug Tx (specifically detailing the rare, but serious side effect of agranulocytosis). She was informed of need for regular thyroid function monitoring while on methimazole to ensure appropriate dosage without over-treatment. As well, we discussed the possible side effects of methimazole including the chance of rash, the small chance of liver irritation/juandice and the <=1 in 300-400 chance of sudden onset agranulocytosis.  We discussed importance of going to ED promptly (and stopping methimazole) if shewere to develop significant fever with severe sore throat of other evidence of acute infection.     We extensively discussed the various treatment options for hyperthyroidism and Graves disease including ablation therapy with radioactive iodine versus antithyroid drug treatment versus surgical therapy.  We recommended to the patient that we felt, at this time, Radioactive iodine ablation . We also discussed that with radioactive iodine ablation, she will need to be on lifelong LT for replacement therapy. Patient would like to avoid radioactive iodine ablation at this time, she is motivated to take methimazole as prescribed 2 tablets twice a day, patient assured me she will start using a timer on her phone.    Medications  Methimazole 20 mg twice daily    2. Graves' Disease:   - No extrathyroidal manifestations of Graves' disease. - Pt encouraged to have an eye exam      Follow-up in 3 months      Signed electronically by: Mack Guise, MD  [facility]  [facility] [address] [place], [place] [address] Phone: [phone] FAX: [phone]      CC: Billie Ruddy, [place] [address] Phone: [phone]  Fax: [phone]   Return to Endocrinology clinic as below: Future Appointments  Date Time Provider [place]  03/21/2019  3:40 PM Samil Mecham,  Melanie Crazier, MD [id number] None

## 2019-06-28 ENCOUNTER — Ambulatory Visit: Payer: 59 | Admitting: Family Medicine

## 2019-07-13 ENCOUNTER — Encounter: Payer: Self-pay | Admitting: Family Medicine

## 2019-07-13 ENCOUNTER — Ambulatory Visit (INDEPENDENT_AMBULATORY_CARE_PROVIDER_SITE_OTHER): Payer: 59 | Admitting: Family Medicine

## 2019-07-13 ENCOUNTER — Other Ambulatory Visit: Payer: Self-pay

## 2019-07-13 VITALS — BP 110/82 | HR 100 | Temp 98.0°F | Wt 170.0 lb

## 2019-07-13 DIAGNOSIS — H9313 Tinnitus, bilateral: Secondary | ICD-10-CM

## 2019-07-13 NOTE — Progress Notes (Signed)
Subjective:    Patient ID: Sheri Foster, female    DOB: 1996/02/12, 24 y.o.   MRN: [id number]  No chief complaint on file.   HPI Pt is a 24 yo female with pmh sig for Grave's dz, eczema, and h/o migraines who was seen for ongoing concern.  Pt endorses ringing in b/l ears x 2 months.  Initially noticed in Nov after having wisdom teeth removed and developing an abscess.  Ringing recently returned and is constant/steady, at times becomes louder.  Causing insomnia.  Has to play sound to drown out the noise.  Pt denies changes in medications.  On last day of abx for AOM.  Denies fever, chills, allergies, facial pain or pressure, decreased hearing.  Pt taking Methimazole for Grave's dz.  Pt stopped taking Atenolol x 1 month as she thought it may be contributing to the ringing.  Past Medical History:  Diagnosis Date  . Depression   . Migraines     No Known Allergies  ROS General: Denies fever, chills, night sweats, changes in weight, changes in appetite HEENT: Denies headaches, ear pain, changes in vision, rhinorrhea, sore throat  +tinnitus CV: Denies CP, palpitations, SOB, orthopnea Pulm: Denies SOB, cough, wheezing GI: Denies abdominal pain, nausea, vomiting, diarrhea, constipation GU: Denies dysuria, hematuria, frequency, vaginal discharge Msk: Denies muscle cramps, joint pains Neuro: Denies weakness, numbness, tingling Skin: Denies rashes, bruising Psych: Denies depression, anxiety, hallucinations    Objective:    Blood pressure 110/82, pulse 100, temperature 98 F (36.7 C), temperature source Temporal, weight 170 lb (77.1 kg), SpO2 98 %.  Hearing tested and normal.  Gen. Pleasant, well-nourished, in no distress, normal affect   HEENT: [place]/AT, face symmetric, conjunctiva clear, no scleral icterus, PERRLA, EOMI, nares patent without drainage, pharynx without erythema or exudate.  TMs nonbulging, nonerythematous, dull in color, cone of light visible.  No cerumen impaction. Neck: No  JVD, no thyromegaly, no carotid bruits Lungs: no accessory muscle use, CTAB, no wheezes or rales Cardiovascular: RRR, no m/r/g, no peripheral edema Neuro:  A&Ox3, CN II-XII intact, normal gait Skin:  Warm, no lesions/ rash  Wt Readings from Last 3 Encounters:  07/13/19 170 lb (77.1 kg)  12/20/18 158 lb 12.8 oz (72 kg)  05/31/18 150 lb 9.6 oz (68.3 kg)    Lab Results  Component Value Date   WBC 4.6 01/11/2019   HGB 12.3 01/11/2019   HCT 39.2 01/11/2019   PLT 281 01/11/2019   GLUCOSE 95 01/11/2019   NA 137 01/11/2019   K 4.0 01/11/2019   CL 106 01/11/2019   CREATININE 0.57 01/11/2019   BUN <5 (L) 01/11/2019   CO2 22 01/11/2019   TSH <0.01 (L) 01/31/2019    Assessment/Plan:  Tinnitus of both ears  -AOM resolving -Hearing tested and normal b/l -discussed possible causes including medications, illness, thyroid dysfunction, structural abnormalities, idiopathic -given handout - Plan: Ambulatory referral to ENT, TSH, T4, Free, Hemoglobin A1c, Vitamin B12, Folate  F/u prn  Abbe Amsterdam, MD

## 2019-07-13 NOTE — Patient Instructions (Signed)

## 2019-07-14 ENCOUNTER — Encounter: Payer: Self-pay | Admitting: Family Medicine

## 2019-07-14 LAB — T4, FREE: Free T4: 0.68 ng/dL (ref 0.60–1.60)

## 2019-07-14 LAB — FOLATE: Folate: >23.7 ng/mL (ref >5.9)

## 2019-07-14 LAB — HEMOGLOBIN A1C: Hgb A1c MFr Bld: 5.2 % (ref 4.6–6.5)

## 2019-07-14 LAB — VITAMIN B12: Vitamin B-12: 359 pg/mL (ref 211–911)

## 2019-07-14 LAB — TSH: TSH: 0.92 u[IU]/mL (ref 0.35–4.50)

## 2019-08-10 ENCOUNTER — Encounter (INDEPENDENT_AMBULATORY_CARE_PROVIDER_SITE_OTHER): Payer: Self-pay | Admitting: Otolaryngology

## 2019-08-10 ENCOUNTER — Ambulatory Visit (INDEPENDENT_AMBULATORY_CARE_PROVIDER_SITE_OTHER): Payer: 59 | Admitting: Otolaryngology

## 2019-08-10 ENCOUNTER — Other Ambulatory Visit: Payer: Self-pay

## 2019-08-10 VITALS — Temp 97.5°F

## 2019-08-10 DIAGNOSIS — H9313 Tinnitus, bilateral: Secondary | ICD-10-CM

## 2019-08-10 NOTE — Progress Notes (Signed)
HPI: Sheri Foster is a 24 y.o. female who presents is referred by Dr. Salomon Fick for evaluation of tinnitus in both ears which is worse at night.  She first noticed the tinnitus back in October of last year when she had a wisdom teeth removed under anesthesia on 02/14/2019.  She initially has some swelling in her jaws and when the swelling resolved she had persistent ringing in her ears that she noticed more in January.  She apparently had an ear infection 2 months ago that was treated with antibiotic.  She has not noted that much hearing loss but complains of ringing in the ears with the right side worse than the left.. She denies any loud noise exposure.  No history of trauma to the head.  Past Medical History:  Diagnosis Date  . Depression   . Migraines    No past surgical history on file. Social History   Socioeconomic History  . Marital status: Single    Spouse name: Not on file  . Number of children: Not on file  . Years of education: Not on file  . Highest education level: Not on file  Occupational History  . Not on file  Tobacco Use  . Smoking status: Never Smoker  . Smokeless tobacco: Never Used  Substance and Sexual Activity  . Alcohol use: Yes  . Drug use: Never  . Sexual activity: Not Currently  Other Topics Concern  . Not on file  Social History Narrative  . Not on file   Social Determinants of Health   Financial Resource Strain:   . Difficulty of Paying Living Expenses:   Food Insecurity:   . Worried About Programme researcher, broadcasting/film/video in the Last Year:   . Barista in the Last Year:   Transportation Needs:   . Freight forwarder (Medical):   Marland Kitchen Lack of Transportation (Non-Medical):   Physical Activity:   . Days of Exercise per Week:   . Minutes of Exercise per Session:   Stress:   . Feeling of Stress :   Social Connections:   . Frequency of Communication with Friends and Family:   . Frequency of Social Gatherings with Friends and Family:   . Attends  Religious Services:   . Active Member of Clubs or Organizations:   . Attends Banker Meetings:   Marland Kitchen Marital Status:    Family History  Problem Relation Age of Onset  . Asthma Mother   . Hyperlipidemia Mother   . Hypertension Mother   . Hyperthyroidism Mother   . Hypothyroidism Other    No Known Allergies Prior to Admission medications   Medication Sig Start Date End Date Taking? Authorizing Provider  atenolol (TENORMIN) 50 MG tablet TK 1 T PO D 07/18/18  Yes [provider]  Meloxicam 10 MG CAPS Take 10 mg by mouth daily as needed (pain/swelling). 03/02/19  Yes Petrucelli, Samantha R, PA-C  naproxen (NAPROSYN) 500 MG tablet Take 1 tablet (500 mg total) by mouth 2 (two) times daily as needed for mild pain or moderate pain. 01/11/19  Yes Ward, Chase Picket, PA-C  methimazole (TAPAZOLE) 10 MG tablet Take 2 tablets (20 mg total) by mouth 2 (two) times daily. Patient not taking: Reported on 08/10/2019 12/21/18   Shamleffer, Konrad Dolores, MD     Positive ROS: Otherwise negative  All other systems have been reviewed and were otherwise negative with the exception of those mentioned in the HPI and as above.  Physical Exam:  Constitutional: Alert, well-appearing, no acute distress Ears: External ears without lesions or tenderness. Ear canals are clear bilaterally.  Both TMs are clear with good mobility on pneumatic otoscopy. Nasal: External nose without lesions. Septum midline with mild rhinitis.  No signs of infection.. Clear nasal passages Oral: Lips and gums without lesions. Tongue and palate mucosa without lesions. Posterior oropharynx clear. Neck: No palpable adenopathy or masses Respiratory: Breathing comfortably  Skin: No facial/neck lesions or rash noted.  Audiogram performed in the office today demonstrated normal hearing in both ears with SRT's of 10 dB bilaterally.  She had type A tympanograms bilaterally.  Procedures  Assessment: Tinnitus questionable  etiology. Normal ear and hearing examination.  Plan: Reviewed with patient today concerning normal ear and hearing examination. Briefly discussed with her concerning limited treatment options for tinnitus. Discussed with her concerning using masking noise when the tinnitus is bothersome especially at night. Cautioned her about using ear protection when around loud noise as this may make the tinnitus worse. Gave her some samples of Lipo flavonoid to try as this is beneficial in some people with tinnitus. Recommended follow-up in 2 months if the tinnitus worsens or she notices any change in her hearing.   Radene Journey, MD   CC:

## 2019-08-21 ENCOUNTER — Encounter (INDEPENDENT_AMBULATORY_CARE_PROVIDER_SITE_OTHER): Payer: Self-pay

## 2020-09-25 ENCOUNTER — Encounter: Payer: Self-pay | Admitting: Family Medicine

## 2020-09-25 ENCOUNTER — Ambulatory Visit: Payer: 59 | Admitting: Family Medicine

## 2020-09-25 ENCOUNTER — Other Ambulatory Visit: Payer: 59

## 2020-09-25 ENCOUNTER — Ambulatory Visit (INDEPENDENT_AMBULATORY_CARE_PROVIDER_SITE_OTHER): Payer: 59

## 2020-09-25 ENCOUNTER — Other Ambulatory Visit: Payer: Self-pay

## 2020-09-25 VITALS — BP 140/96 | HR 89 | Temp 99.0°F | Wt 186.2 lb

## 2020-09-25 DIAGNOSIS — M25562 Pain in left knee: Secondary | ICD-10-CM | POA: Diagnosis not present

## 2020-09-25 DIAGNOSIS — M25662 Stiffness of left knee, not elsewhere classified: Secondary | ICD-10-CM

## 2020-09-25 NOTE — Progress Notes (Signed)
Subjective:    Patient ID: Sheri Foster, female    DOB: 08-Feb-1996, 25 y.o.   MRN: [id number]  Chief Complaint  Patient presents with  . Knee Pain    Left knee stiff for about 4 weeks.has not tried anything. Stiffness occurs both sit to stand and stand to sit, when standing or sitting for long periods.    HPI Patient was seen today for L knee pain x 1 month.  Started after pushing a weighted sled in the gym. Pain behind patella with standing or sitting.  Feels tight and unstable at times.  Denies any pops, clicks, or tears.  Has not tried anything for her symptoms. Past Medical History:  Diagnosis Date  . Depression   . Migraines     No Known Allergies  ROS General: Denies fever, chills, night sweats, changes in weight, changes in appetite HEENT: Denies headaches, ear pain, changes in vision, rhinorrhea, sore throat CV: Denies CP, palpitations, SOB, orthopnea Pulm: Denies SOB, cough, wheezing GI: Denies abdominal pain, nausea, vomiting, diarrhea, constipation GU: Denies dysuria, hematuria, frequency, vaginal discharge Msk: Denies muscle cramps, joint pains  +medial L knee pain Neuro: Denies weakness, numbness, tingling  Skin: Denies rashes, bruising Psych: Denies depression, anxiety, hallucinations     Objective:    Blood pressure (!) 140/96, pulse 89, temperature 99 F (37.2 C), temperature source Oral, weight 186 lb 3.2 oz (84.5 kg), SpO2 99 %.   Gen. Pleasant, well-nourished, in no distress, normal affect   HEENT: [place]/AT, face symmetric, conjunctiva clear, no scleral icterus, PERRLA, EOMI, nares patent without drainage, pharynx without erythema or exudate. Neck: No JVD, no thyromegaly, no carotid bruits Lungs: no accessory muscle use, CTAB, no wheezes or rales Cardiovascular: RRR, no m/r/g, no peripheral edema Musculoskeletal: L knee crepitus, mild effusion, increased warmth.  Popliteal pulses intact 2+ b/l.  R knee without crepitus, effusion, or deformity.   No  deformities, no cyanosis or clubbing, normal tone Neuro:  A&Ox3, CN II-XII intact, normal gait Skin:  Warm, no lesions/ rash   Wt Readings from Last 3 Encounters:  09/25/20 186 lb 3.2 oz (84.5 kg)  07/13/19 170 lb (77.1 kg)  12/20/18 158 lb 12.8 oz (72 kg)    Lab Results  Component Value Date   WBC 4.6 01/11/2019   HGB 12.3 01/11/2019   HCT 39.2 01/11/2019   PLT 281 01/11/2019   GLUCOSE 95 01/11/2019   NA 137 01/11/2019   K 4.0 01/11/2019   CL 106 01/11/2019   CREATININE 0.57 01/11/2019   BUN <5 (L) 01/11/2019   CO2 22 01/11/2019   TSH 0.92 07/13/2019   HGBA1C 5.2 07/13/2019    Assessment/Plan:  Acute pain of left knee  -discussed possible causes including internal derangement, strain/sprain, arthritis, gout -Discussed treatment of symptoms including NSAIDs, topical analgesics, ice, rest, elevation, compression. -Patient declined Rx for NSAIDs.  Will use ibuprofen at home. -We will obtain x-ray -Further recommendations based on imaging -Discussed minimal effusion on exam.  Consider drainage if increases in size -If needed will place referral to Ortho - Plan: DG Knee Complete 4 Views Left  Knee stiffness, left -2/2 inflammation and effusion -Supportive care as above  F/u in the next few weeks  Abbe Amsterdam, MD

## 2020-09-25 NOTE — Patient Instructions (Signed)
Acute Knee Pain, Adult Acute knee pain is sudden and may be caused by damage, swelling, or irritation of the muscles and tissues that support the knee. Pain may result from:  A fall.  An injury to the knee from twisting motions.  A hit to the knee.  Infection. Acute knee pain may go away on its own with time and rest. If it does not, your health care provider may order tests to find the cause of the pain. These may include:  Imaging tests, such as an X-ray, MRI, CT scan, or ultrasound.  Joint aspiration. In this test, fluid is removed from the knee and evaluated.  Arthroscopy. In this test, a lighted tube is inserted into the knee and an image is projected onto a TV screen.  Biopsy. In this test, a sample of tissue is removed from the body and studied under a microscope. Follow these instructions at home: If you have a knee sleeve or brace:  Wear the knee sleeve or brace as told by your health care provider. Remove it only as told by your health care provider.  Loosen it if your toes tingle, become numb, or turn cold and blue.  Keep it clean.  If the knee sleeve or brace is not waterproof: ? Do not let it get wet. ? Cover it with a watertight covering when you take a bath or shower.   Activity  Rest your knee.  Do not do things that cause pain or make pain worse.  Avoid high-impact activities or exercises, such as running, jumping rope, or doing jumping jacks.  Work with a physical therapist to make a safe exercise program, as recommended by your health care provider. Do exercises as told by your physical therapist. Managing pain, stiffness, and swelling  If directed, put ice on the affected knee. To do this: ? If you have a removable knee sleeve or brace, remove it as told by your health care provider. ? Put ice in a plastic bag. ? Place a towel between your skin and the bag. ? Leave the ice on for 20 minutes, 2-3 times a day. ? Remove the ice if your skin turns bright  red. This is very important. If you cannot feel pain, heat, or cold, you have a greater risk of damage to the area.  If directed, use an elastic bandage to put pressure (compression) on your injured knee. This may control swelling, give support, and help with discomfort.  Raise (elevate) your knee above the level of your heart while you are sitting or lying down.  Sleep with a pillow under your knee.   General instructions  Take over-the-counter and prescription medicines only as told by your health care provider.  Do not use any products that contain nicotine or tobacco, such as cigarettes, e-cigarettes, and chewing tobacco. If you need help quitting, ask your health care provider.  If you are overweight, work with your health care provider and a dietitian to set a weight-loss goal that is healthy and reasonable for you. Extra weight can put pressure on your knee.  Pay attention to any changes in your symptoms.  Keep all follow-up visits. This is important. Contact a health care provider if:  Your knee pain continues, changes, or gets worse.  You have a fever along with knee pain.  Your knee feels warm to the touch or is red.  Your knee buckles or locks up. Get help right away if:  Your knee swells, and the swelling becomes   worse.  You cannot move your knee.  You have severe pain in your knee that cannot be managed with pain medicine. Summary  Acute knee pain can be caused by a fall, an injury, an infection, or damage, swelling, or irritation of the tissues that support your knee.  Your health care provider may perform tests to find out the cause of the pain.  Pay attention to any changes in your symptoms. Relieve your pain with rest, medicines, light activity, and the use of ice.  Get help right away if your knee swells, you cannot move your knee, or you have severe pain that cannot be managed with medicine. This information is not intended to replace advice given to you  by your health care provider. Make sure you discuss any questions you have with your health care provider. Document Revised: 09/27/2019 Document Reviewed: 09/27/2019 Elsevier Patient Education  2021 [employer].  

## 2020-09-26 NOTE — Progress Notes (Signed)
Patient viewed results on MyChart. 

## 2020-10-03 ENCOUNTER — Encounter: Payer: 59 | Admitting: Family Medicine

## 2021-05-09 ENCOUNTER — Ambulatory Visit (INDEPENDENT_AMBULATORY_CARE_PROVIDER_SITE_OTHER): Payer: 59 | Admitting: Family Medicine

## 2021-05-09 VITALS — BP 128/96 | HR 99 | Temp 98.8°F | Ht 64.0 in | Wt 177.0 lb

## 2021-05-09 DIAGNOSIS — L309 Dermatitis, unspecified: Secondary | ICD-10-CM | POA: Diagnosis not present

## 2021-05-09 DIAGNOSIS — E049 Nontoxic goiter, unspecified: Secondary | ICD-10-CM

## 2021-05-09 DIAGNOSIS — Z0000 Encounter for general adult medical examination without abnormal findings: Secondary | ICD-10-CM

## 2021-05-09 DIAGNOSIS — Z13220 Encounter for screening for lipoid disorders: Secondary | ICD-10-CM | POA: Diagnosis not present

## 2021-05-09 LAB — BASIC METABOLIC PANEL
BUN: 8 mg/dL (ref 6–23)
CO2: 24 mEq/L (ref 19–32)
Calcium: 9.4 mg/dL (ref 8.4–10.5)
Chloride: 104 mEq/L (ref 96–112)
Creatinine, Ser: 0.79 mg/dL (ref 0.40–1.20)
GFR: 102.28 mL/min (ref >60.00)
Glucose, Bld: 86 mg/dL (ref 70–99)
Potassium: 3.7 mEq/L (ref 3.5–5.1)
Sodium: 137 mEq/L (ref 135–145)

## 2021-05-09 LAB — CBC WITH DIFFERENTIAL/PLATELET
Basophils Absolute: 0 10*3/uL (ref 0.0–0.1)
Basophils Relative: 0.4 % (ref 0.0–3.0)
Eosinophils Absolute: 0 10*3/uL (ref 0.0–0.7)
Eosinophils Relative: 1.2 % (ref 0.0–5.0)
HCT: 37.4 % (ref 36.0–46.0)
Hemoglobin: 12 g/dL (ref 12.0–15.0)
Lymphocytes Relative: 39 % (ref 12.0–46.0)
Lymphs Abs: 1.3 10*3/uL (ref 0.7–4.0)
MCHC: 32.2 g/dL (ref 30.0–36.0)
MCV: 83.5 fl (ref 78.0–100.0)
Monocytes Absolute: 0.3 10*3/uL (ref 0.1–1.0)
Monocytes Relative: 8.1 % (ref 3.0–12.0)
Neutro Abs: 1.8 10*3/uL (ref 1.4–7.7)
Neutrophils Relative %: 51.3 % (ref 43.0–77.0)
Platelets: 221 10*3/uL (ref 150.0–400.0)
RBC: 4.48 Mil/uL (ref 3.87–5.11)
RDW: 13.1 % (ref 11.5–15.5)
WBC: 3.4 10*3/uL — ABNORMAL LOW (ref 4.0–10.5)

## 2021-05-09 LAB — LIPID PANEL
Cholesterol: 170 mg/dL (ref 0–200)
HDL: 38.8 mg/dL — ABNORMAL LOW (ref >39.00)
LDL Cholesterol: 113 mg/dL — ABNORMAL HIGH (ref 0–99)
NonHDL: 130.89
Total CHOL/HDL Ratio: 4
Triglycerides: 91 mg/dL (ref 0.0–149.0)
VLDL: 18.2 mg/dL (ref 0.0–40.0)

## 2021-05-09 LAB — HEMOGLOBIN A1C: Hgb A1c MFr Bld: 5.5 % (ref 4.6–6.5)

## 2021-05-09 LAB — TSH: TSH: 0.93 u[IU]/mL (ref 0.35–5.50)

## 2021-05-09 LAB — T4, FREE: Free T4: 1.19 ng/dL (ref 0.60–1.60)

## 2021-05-09 NOTE — Progress Notes (Signed)
Subjective:     Sheri Foster is a 26 y.o. female and is here for a comprehensive physical exam. The patient reports no problems.  Allergy to nickel.  Button on on jeans and clasps on jewelry cause irritation to skin.  Using Shea butter on skin for eczema.  Having a flare currently.  Typically likes taking hot showers.  Pt no longer having knee pain.  Still dealing with tinnitus, but it is manageable.  Notes improvement in goiter.  Has decreased in size and no longer taking thyroid medication.  Social History   Socioeconomic History   Marital status: Single    Spouse name: Not on file   Number of children: Not on file   Years of education: Not on file   Highest education level: Not on file  Occupational History   Not on file  Tobacco Use   Smoking status: Never   Smokeless tobacco: Never  Substance and Sexual Activity   Alcohol use: Yes   Drug use: Never   Sexual activity: Not Currently  Other Topics Concern   Not on file  Social History Narrative   Not on file   Social Determinants of Health   Financial Resource Strain: Not on file  Food Insecurity: Not on file  Transportation Needs: Not on file  Physical Activity: Not on file  Stress: Not on file  Social Connections: Not on file  Intimate Partner Violence: Not on file   Health Maintenance  Topic Date Due   HIV Screening  Never done   Hepatitis C Screening  Never done   TETANUS/TDAP  Never done   PAP-Cervical Cytology Screening  01/31/2021   COVID-19 Vaccine (1) 05/25/2021 (Originally 02/10/1996)   INFLUENZA VACCINE  07/25/2021 (Originally 11/25/2020)   PAP SMEAR-Modifier  11/08/2021   Pneumococcal Vaccine 36-23 Years old  Aged Out   HPV VACCINES  Aged Out    The following portions of the patient's history were reviewed and updated as appropriate: allergies, current medications, past family history, past medical history, past social history, past surgical history, and problem list.  Review of Systems Pertinent  items noted in HPI and remainder of comprehensive ROS otherwise negative.   Objective:    BP (!) 128/96 (BP Location: Right Arm, Patient Position: Sitting, Cuff Size: Large)    Pulse 99    Temp 98.8 F (37.1 C) (Oral)    Ht 5\' 4"  (1.626 m)    Wt 177 lb (80.3 kg)    SpO2 98%    BMI 30.38 kg/m  General appearance: alert, cooperative, and no distress Head: Normocephalic, without obvious abnormality, atraumatic Eyes: conjunctivae/corneas clear. PERRL, EOM's intact. Fundi benign. Ears: normal TM's and external ear canals both ears Nose: Nares normal. Septum midline. Mucosa normal. No drainage or sinus tenderness. Throat: lips, mucosa, and tongue normal; teeth and gums normal Neck: no adenopathy, no carotid bruit, no JVD, supple, symmetrical, trachea midline, and goiter R lobe >L smooth, without nodules Lungs: clear to auscultation bilaterally Heart: regular rate and rhythm, S1, S2 normal, no murmur, click, rub or gallop Abdomen: soft, non-tender; bowel sounds normal; no masses,  no organomegaly Extremities: extremities normal, atraumatic, no cyanosis or edema Pulses: 2+ and symmetric Skin: Skin color, texture, turgor normal. Eczematous patches on neck. Lymph nodes: Cervical, supraclavicular, and axillary nodes normal. Neurologic: Alert and oriented X 3, normal strength and tone. Normal symmetric reflexes. Normal coordination and gait    Assessment:    Healthy female exam with eczema and goiter.  Plan:    Anticipatory guidance given including wearing seatbelts, smoke detectors in the home, increasing physical activity, increasing p.o. intake of water and vegetables. -labs -Seen by OB/GYN for Pap -Immunizations declined. -Given handout -Neck CPE in 1 year See After Visit Summary for Counseling Recommendations   Goiter -Improving -Thyrotoxicosis previously noted -Not currently on medication  - Plan: TSH, T4, Free  Eczema, unspecified type -Discussed using gentle/unscented  soaps, lotions, detergents -Avoid taking hot showers, apply moisturizing emollients after showering -Steroid cream as needed  - Plan: CBC with Differential/Platelet  Screening for cholesterol level - Plan: Lipid panel  F/u prn  Abbe Amsterdam, MD

## 2021-09-24 ENCOUNTER — Encounter: Payer: Self-pay | Admitting: Family Medicine

## 2021-09-24 ENCOUNTER — Ambulatory Visit: Payer: 59 | Admitting: Family Medicine

## 2021-09-24 VITALS — BP 120/88 | HR 85 | Temp 99.4°F | Ht 64.0 in | Wt 170.4 lb

## 2021-09-24 DIAGNOSIS — Z113 Encounter for screening for infections with a predominantly sexual mode of transmission: Secondary | ICD-10-CM

## 2021-09-24 NOTE — Progress Notes (Signed)
Subjective:    Patient ID: Sheri Foster, female    DOB: 1996/01/11, 26 y.o.   MRN: [id number]  Chief Complaint  Patient presents with   patient requests STD screening tests, denies exposure    HPI Patient was seen today for routine STI screening.  Patient states she is doing well and staying busy with work.  Denies any issues such as discharge, irritation, abd pain, n/v.  Past Medical History:  Diagnosis Date   Depression    Migraines     No Known Allergies  ROS General: Denies fever, chills, night sweats, changes in weight, changes in appetite HEENT: Denies headaches, ear pain, changes in vision, rhinorrhea, sore throat CV: Denies CP, palpitations, SOB, orthopnea Pulm: Denies SOB, cough, wheezing GI: Denies abdominal pain, nausea, vomiting, diarrhea, constipation GU: Denies dysuria, hematuria, frequency, vaginal discharge Msk: Denies muscle cramps, joint pains Neuro: Denies weakness, numbness, tingling Skin: Denies rashes, bruising Psych: Denies depression, anxiety, hallucinations    Objective:    Blood pressure 120/88, pulse 85, temperature 99.4 F (37.4 C), temperature source Oral, height 5\' 4"  (1.626 m), weight 170 lb 6.4 oz (77.3 kg), last menstrual period 09/19/2021, SpO2 100 %.  Gen. Pleasant, well-nourished, in no distress, normal affect   HEENT: [place]/AT, face symmetric, conjunctiva clear, no scleral icterus, PERRLA, EOMI, nares patent without drainage Lungs: no accessory muscle use Cardiovascular: RRR, no peripheral edema Musculoskeletal: No deformities, no cyanosis or clubbing, normal tone Neuro:  A&Ox3, CN II-XII intact, normal gait Skin:  Warm, no lesions/ rash   Wt Readings from Last 3 Encounters:  09/24/21 170 lb 6.4 oz (77.3 kg)  05/09/21 177 lb (80.3 kg)  09/25/20 186 lb 3.2 oz (84.5 kg)    Lab Results  Component Value Date   WBC 3.4 (L) 05/09/2021   HGB 12.0 05/09/2021   HCT 37.4 05/09/2021   PLT 221.0 05/09/2021   GLUCOSE 86 05/09/2021    CHOL 170 05/09/2021   TRIG 91.0 05/09/2021   HDL 38.80 (L) 05/09/2021   LDLCALC 113 (H) 05/09/2021   NA 137 05/09/2021   K 3.7 05/09/2021   CL 104 05/09/2021   CREATININE 0.79 05/09/2021   BUN 8 05/09/2021   CO2 24 05/09/2021   TSH 0.93 05/09/2021   HGBA1C 5.5 05/09/2021    Assessment/Plan:  Routine screening for STI (sexually transmitted infection) - Plan: RPR, HIV Antibody (routine testing w rflx), C. trachomatis/N. gonorrhoeae RNA, Hep C Antibody  F/u prn  05/11/2021, MD

## 2021-09-24 NOTE — Addendum Note (Signed)
Addended by: Marian Sorrow D on: 09/24/2021 03:38 PM   Modules accepted: Orders

## 2021-09-29 LAB — C. TRACHOMATIS/N. GONORRHOEAE RNA

## 2021-09-29 LAB — HEPATITIS C ANTIBODY
Hepatitis C Ab: NONREACTIVE
SIGNAL TO CUT-OFF: 0.13 (ref <1.00)

## 2021-09-29 LAB — RPR: RPR Ser Ql: NONREACTIVE

## 2021-09-29 LAB — HIV ANTIBODY (ROUTINE TESTING W REFLEX): HIV 1&2 Ab, 4th Generation: NONREACTIVE

## 2022-06-05 ENCOUNTER — Ambulatory Visit (INDEPENDENT_AMBULATORY_CARE_PROVIDER_SITE_OTHER): Payer: 59 | Admitting: Family Medicine

## 2022-06-05 ENCOUNTER — Encounter: Payer: Self-pay | Admitting: Family Medicine

## 2022-06-05 VITALS — BP 110/80 | HR 111 | Temp 99.1°F | Ht 64.0 in | Wt 187.2 lb

## 2022-06-05 DIAGNOSIS — R059 Cough, unspecified: Secondary | ICD-10-CM | POA: Diagnosis not present

## 2022-06-05 DIAGNOSIS — J0120 Acute ethmoidal sinusitis, unspecified: Secondary | ICD-10-CM | POA: Diagnosis not present

## 2022-06-05 DIAGNOSIS — J029 Acute pharyngitis, unspecified: Secondary | ICD-10-CM

## 2022-06-05 DIAGNOSIS — J101 Influenza due to other identified influenza virus with other respiratory manifestations: Secondary | ICD-10-CM | POA: Diagnosis not present

## 2022-06-05 LAB — POCT RAPID STREP A (OFFICE): Rapid Strep A Screen: NEGATIVE

## 2022-06-05 LAB — POC COVID19 BINAXNOW: SARS Coronavirus 2 Ag: NEGATIVE

## 2022-06-05 LAB — POCT INFLUENZA A/B
Influenza A, POC: NEGATIVE
Influenza B, POC: POSITIVE — AB

## 2022-06-05 MED ORDER — OSELTAMIVIR PHOSPHATE 75 MG PO CAPS: 75.0000 mg | ORAL_CAPSULE | Freq: Two times a day (BID) | ORAL | 0 refills | Status: AC

## 2022-06-05 MED ORDER — AMOXICILLIN 500 MG PO TABS: 500.0000 mg | ORAL_TABLET | Freq: Two times a day (BID) | ORAL | 0 refills | Status: AC

## 2022-06-05 NOTE — Patient Instructions (Signed)
Prescription for Tamiflu, an antiviral medicine for the flu was sent to pharmacy.  A prescription for amoxicillin was also sent to your pharmacy.  This is for your sinus infection.  You can also use saline nasal rinse or Flonase nasal spray to help with your symptoms.

## 2022-06-05 NOTE — Progress Notes (Signed)
Established Patient Office Visit   Subjective  Patient ID: Sheri Foster, female    DOB: [birth year]  Age: 27 y.o. MRN: [id number]  Chief Complaint  Patient presents with   Sore Throat    Pt reports sx of sore throat, chest pain with sob, productive cough-yellow phlegm, stuffy nose, runny nose, bodyache- better, pin/needle pain from neck to back when sneeze. Denied chills. Sx started on last Friday. Taking aleve,nyquila and cough drop. Was seen with UC on 06/01/2022.    Chest Pain    With SOB   Cough   Nasal Congestion    Pt seen for ongoing illness.  Pt states symptoms started last Fri, 05/29/22.  Pt went to UC on 06/01/22, COVID and flu testing were negative.  Patient states symptoms became worse.  Endorses sinus pressure, pain in general, ear pain/pressure, sore throat, productive cough, fatigue, decreased appetite, nasal congestion.  Patient has noticed pain in right side of neck into the back with a pins and needle sensation with sneezing, started 2 days ago.  Patient tried NyQuil, Ricola, ibuprofen, Aleve, Cepacol for symptoms.      ROS Negative unless stated above    Objective:     BP 110/80 (BP Location: Left Arm, Patient Position: Sitting, Cuff Size: Large)   Pulse (!) 111   Temp 99.1 F (37.3 C) (Oral)   Ht 5' 4"$  (1.626 m)   Wt 187 lb 3.2 oz (84.9 kg)   SpO2 99%   BMI 32.13 kg/m    Physical Exam Constitutional:      Appearance: She is well-developed. She is ill-appearing. She is not toxic-appearing.  HENT:     Head: Normocephalic and atraumatic.     Right Ear: Tympanic membrane normal.     Left Ear: Tympanic membrane normal.     Nose: Congestion present.     Mouth/Throat:     Mouth: Mucous membranes are moist.     Pharynx: Pharyngeal swelling and posterior oropharyngeal erythema present.     Tonsils: No tonsillar exudate.  Eyes:     Conjunctiva/sclera: Conjunctivae normal.     Pupils: Pupils are equal, round, and reactive to light.  Cardiovascular:      Rate and Rhythm: Regular rhythm. Tachycardia present.     Heart sounds: Normal heart sounds.  Pulmonary:     Effort: Pulmonary effort is normal.     Breath sounds: Normal breath sounds.  Musculoskeletal:     Cervical back: Normal range of motion.  Lymphadenopathy:     Cervical: No cervical adenopathy.  Skin:    General: Skin is warm and dry.  Neurological:     Mental Status: She is alert.      Results for orders placed or performed in visit on 06/05/22  POC COVID-19  Result Value Ref Range   SARS Coronavirus 2 Ag Negative Negative  POC Influenza A/B  Result Value Ref Range   Influenza A, POC Negative Negative   Influenza B, POC Positive (A) Negative  POC Rapid Strep A  Result Value Ref Range   Rapid Strep A Screen Negative Negative      Assessment & Plan:  Influenza B -     Oseltamivir Phosphate; Take 1 capsule (75 mg total) by mouth 2 (two) times daily for 5 days.  Dispense: 10 capsule; Refill: 0  Cough, unspecified type -     POC COVID-19 BinaxNow -     POCT Influenza A/B  Sore throat -     POCT rapid  strep A  Acute ethmoidal sinusitis, recurrence not specified -     Amoxicillin; Take 1 tablet (500 mg total) by mouth 2 (two) times daily for 7 days.  Dispense: 14 tablet; Refill: 0  Patient with continued viral illness symptoms x 1 week.  POC testing positive for influenza B in clinic.  Influenza A, COVID, and strep testing negative in clinic.  Discussed r/b/a of antiviral medications.  Rx for Tamiflu sent to pharmacy.  Amoxicillin for sinusitis.  Okay to continue supportive care with rest, hydration.  Given strict precautions.  Return if symptoms worsen or fail to improve.   Billie Ruddy, MD

## 2022-07-26 IMAGING — DX DG KNEE COMPLETE 4+V*L*
4 series · 4 of 4 positions shown · non-contrast
Comparison: None.

CLINICAL DATA: Knee pain and stiffness

EXAM:
LEFT KNEE - COMPLETE 4+ VIEW

[knee standing ap]
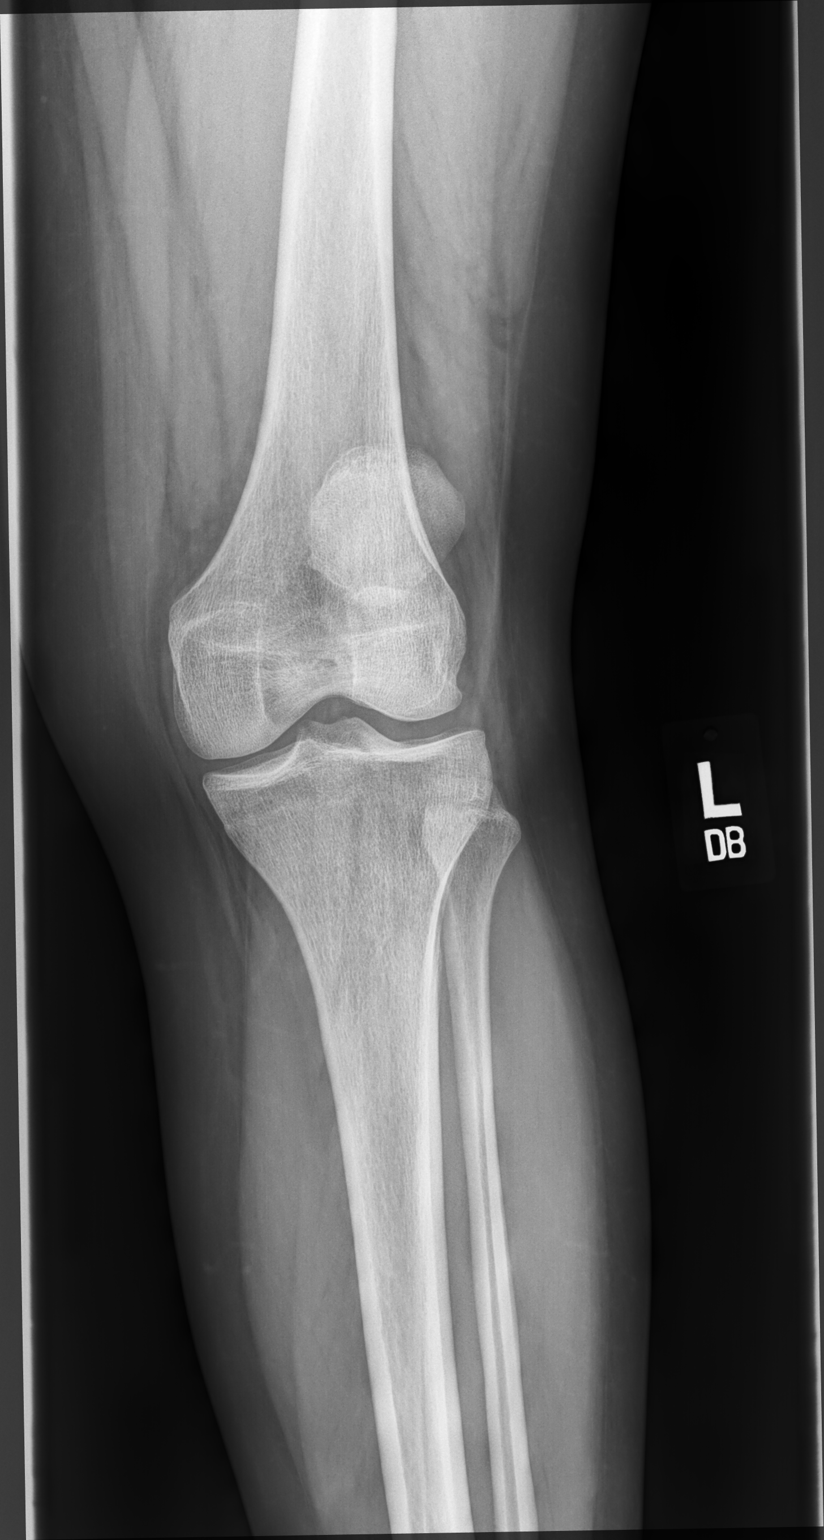

[knee standing lat]
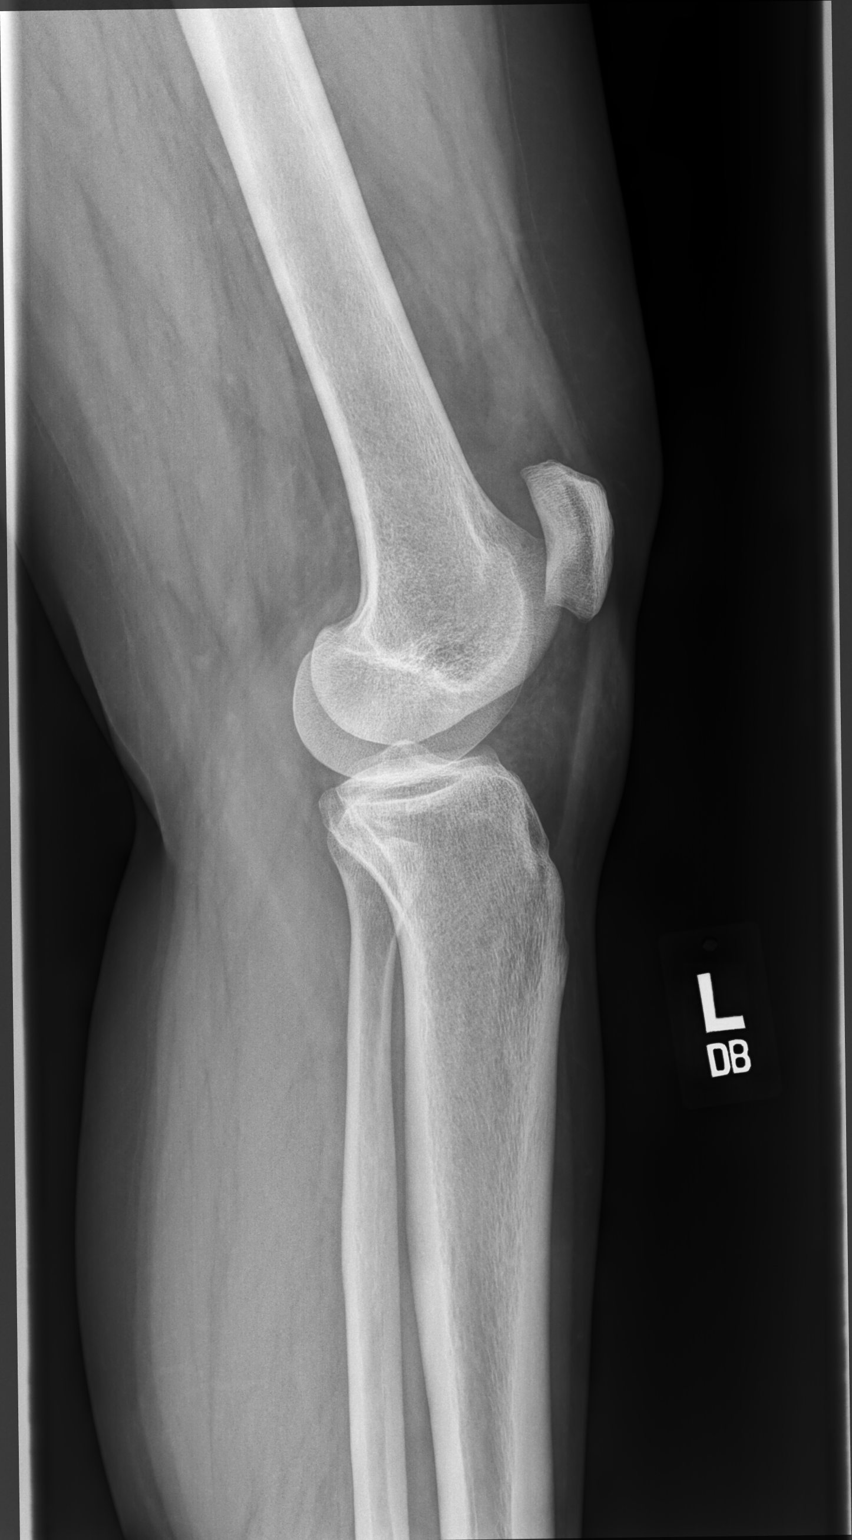

[sunrise]
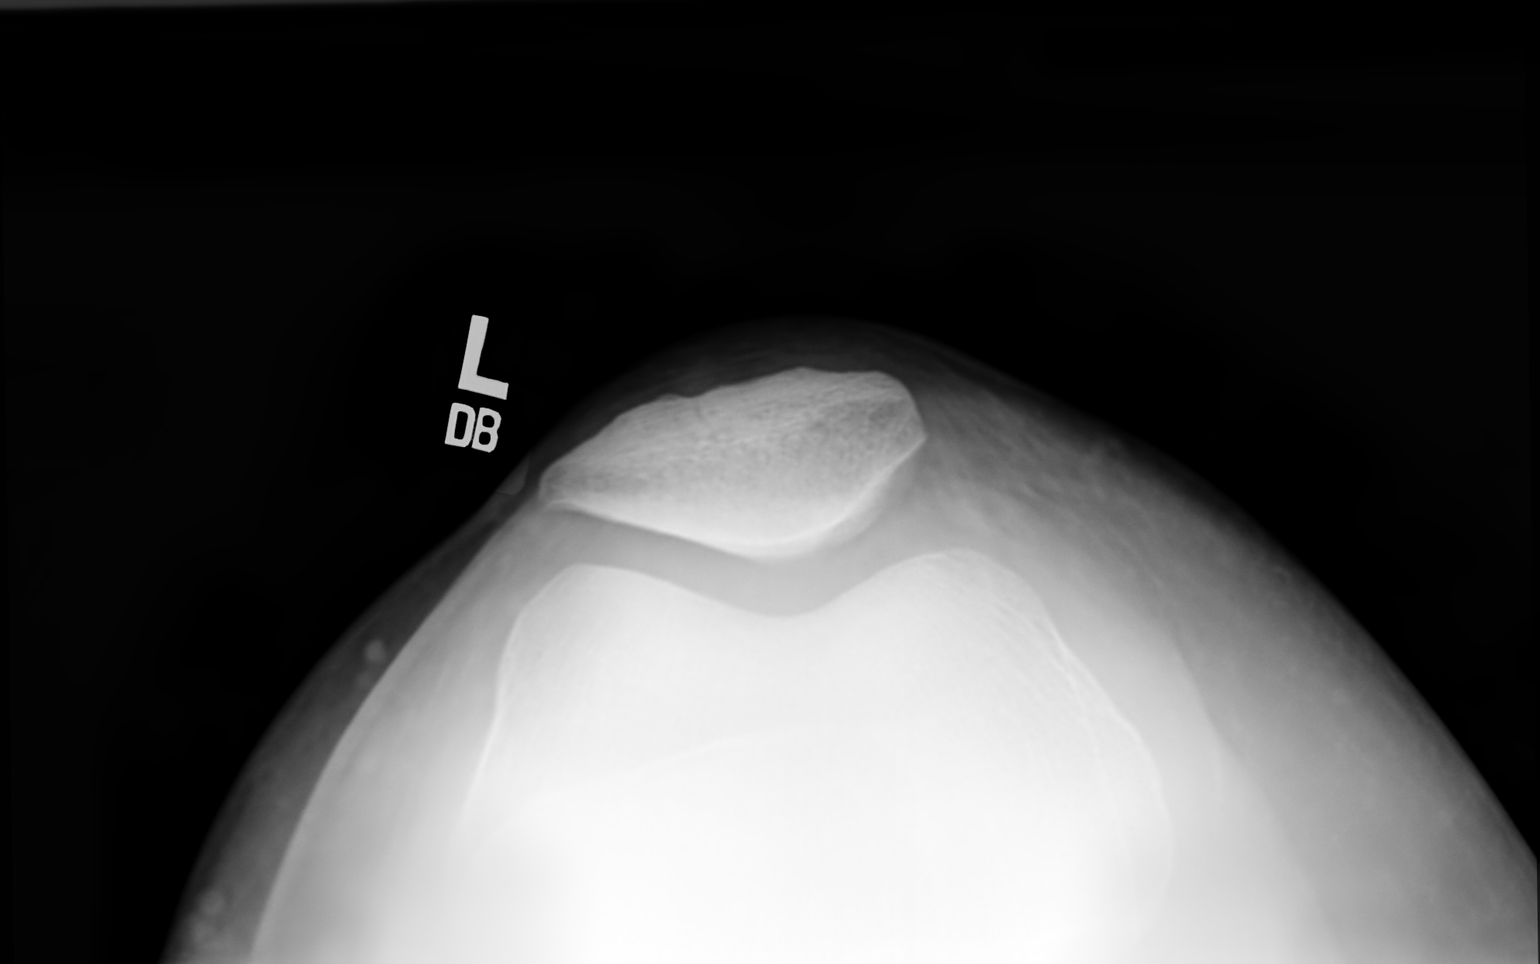

[patella lat]
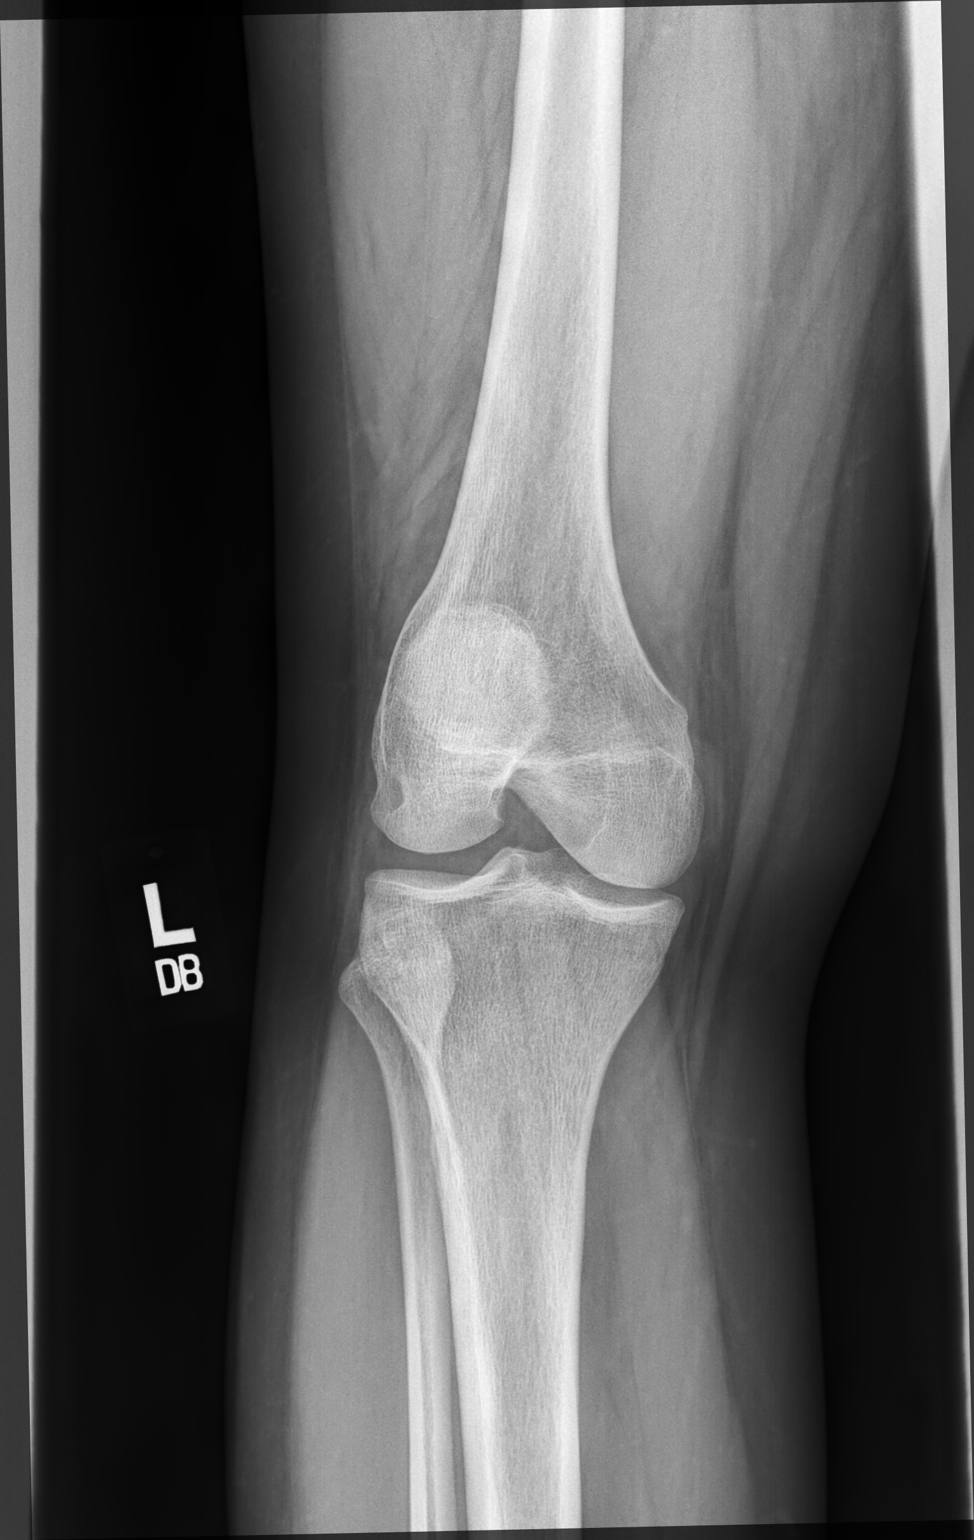

[4 of 4 positions shown; findings below may reference images not displayed]

FINDINGS: No evidence of fracture, dislocation, or joint effusion. No evidence
of arthropathy or other focal bone abnormality. Soft tissues are
unremarkable.
IMPRESSION: Negative.

## 2022-11-25 ENCOUNTER — Ambulatory Visit: Payer: 59 | Admitting: Family Medicine

## 2022-11-27 ENCOUNTER — Ambulatory Visit: Payer: 59 | Admitting: Family Medicine

## 2022-11-30 ENCOUNTER — Ambulatory Visit: Payer: 59 | Admitting: Family Medicine

## 2022-12-18 ENCOUNTER — Ambulatory Visit: Payer: 59 | Admitting: Family Medicine

## 2022-12-18 ENCOUNTER — Encounter: Payer: Self-pay | Admitting: Family Medicine

## 2022-12-18 VITALS — BP 134/70 | HR 97 | Temp 98.5°F | Ht 64.0 in | Wt 195.4 lb

## 2022-12-18 DIAGNOSIS — N920 Excessive and frequent menstruation with regular cycle: Secondary | ICD-10-CM | POA: Diagnosis not present

## 2022-12-18 DIAGNOSIS — R635 Abnormal weight gain: Secondary | ICD-10-CM | POA: Diagnosis not present

## 2022-12-18 DIAGNOSIS — R1032 Left lower quadrant pain: Secondary | ICD-10-CM

## 2022-12-18 DIAGNOSIS — N941 Unspecified dyspareunia: Secondary | ICD-10-CM

## 2022-12-18 LAB — POC URINALSYSI DIPSTICK (AUTOMATED)
Bilirubin, UA: NEGATIVE
Blood, UA: NEGATIVE
Glucose, UA: NEGATIVE
Ketones, UA: NEGATIVE
Nitrite, UA: NEGATIVE
Protein, UA: POSITIVE — AB
Spec Grav, UA: >=1.03 — AB (ref 1.010–1.025)
Urobilinogen, UA: 0.2 E.U./dL
pH, UA: 6 (ref 5.0–8.0)

## 2022-12-18 NOTE — Progress Notes (Signed)
Established Patient Office Visit   Subjective  Patient ID: Sheri Foster, female    DOB: 04/25/1996  Age: 27 y.o. MRN: [id number]  Chief Complaint  Patient presents with   Abdominal Pain    Patient came in today with left lower abdomen pain, started a mth ago, patient has seen blood after peeing, feels like period cramps     Patient is a 27 year old female seen for ongoing concern.  Patient endorses LLQ abdominal pain x 1 month.  States pain is a cramping/pain like something is tearing.  Discomfort relieved with defecation.  Denies increased flatus or abdominal bloating.  Patient denies constipation.  Notes increase in BMs during menses.  Pt had an episode of rectal bleeding after BM.  States noticed light pink blood on tissue.  Denies history of hemorrhoids, dizziness, palpitations.  Patient concerned she may have fibroids given her family history.  Notes dyspareunia and increased cramping/spasms after orgasm.  Patient notes painful, regular menses that has become longer in duration.  Previously 3 days, now 6-7d with heavier bleeding.  LMP 12/02/2022.  Pt also notes unintentional weight gain of  25 lbs since May 2023.  Patient with a prior history of hyperthyroidism.  Was followed by Endo.  Stopped propranolol and methimazole as they made her feel funny.  Pt started eating kale 3 times daily after reading something online which seemed to help symptoms.    Past Medical History:  Diagnosis Date   Depression    Migraines    History reviewed. No pertinent surgical history. Social History   Tobacco Use   Smoking status: Never   Smokeless tobacco: Never  Substance Use Topics   Alcohol use: Yes   Drug use: Never   Family History  Problem Relation Age of Onset   Asthma Mother    Hyperlipidemia Mother    Hypertension Mother    Hyperthyroidism Mother    Hypothyroidism Other    No Known Allergies    ROS Negative unless stated above    Objective:     BP 134/70 (BP Location:  Left Arm, Patient Position: Sitting, Cuff Size: Normal)   Pulse 97   Temp 98.5 F (36.9 C) (Oral)   Ht 5\' 4"  (1.626 m)   Wt 195 lb 6.4 oz (88.6 kg)   LMP 12/02/2022   SpO2 98%   BMI 33.54 kg/m  BP Readings from Last 3 Encounters:  12/18/22 134/70  06/05/22 110/80  09/24/21 120/88   Wt Readings from Last 3 Encounters:  12/18/22 195 lb 6.4 oz (88.6 kg)  06/05/22 187 lb 3.2 oz (84.9 kg)  09/24/21 170 lb 6.4 oz (77.3 kg)      Physical Exam Constitutional:      General: She is not in acute distress.    Appearance: Normal appearance.  HENT:     Head: Normocephalic and atraumatic.     Nose: Nose normal.     Mouth/Throat:     Mouth: Mucous membranes are moist.  Cardiovascular:     Rate and Rhythm: Normal rate and regular rhythm.     Heart sounds: Normal heart sounds. No murmur heard.    No gallop.  Pulmonary:     Effort: Pulmonary effort is normal. No respiratory distress.     Breath sounds: Normal breath sounds. No wheezing, rhonchi or rales.  Abdominal:     General: Bowel sounds are normal.     Tenderness: There is abdominal tenderness in the left lower quadrant.  Skin:  General: Skin is warm and dry.  Neurological:     Mental Status: She is alert and oriented to person, place, and time.      Results for orders placed or performed in visit on 12/18/22  POCT Urinalysis Dipstick (Automated)  Result Value Ref Range   Color, UA yellow    Clarity, UA clear    Glucose, UA Negative Negative   Bilirubin, UA neg    Ketones, UA neg    Spec Grav, UA >=1.030 (A) 1.010 - 1.025   Blood, UA neg    pH, UA 6.0 5.0 - 8.0   Protein, UA Positive (A) Negative   Urobilinogen, UA 0.2 0.2 or 1.0 E.U./dL   Nitrite, UA neg    Leukocytes, UA Trace (A) Negative      Assessment & Plan:  LLQ abdominal pain -     POCT Urinalysis Dipstick (Automated) -     CBC with Differential/Platelet; Future -     Comprehensive metabolic panel; Future  Dyspareunia, female -     TSH; Future -      T4, free; Future -     T3, free; Future -     Urine Culture  Weight gain -     TSH; Future -     T4, free; Future -     T3, free; Future  Menorrhagia with regular cycle -     TSH; Future -     T4, free; Future -     T3, free; Future -     CBC with Differential/Platelet; Future  Patient presents with several acute new problems.  Discussed possible causes of LLQ abdominal pain including fibroids, endometriosis, constipation, ovarian cyst, ovarian torsion.  Pregnancy less likely.  Discussed obtaining labs to further evaluate for anemia and possible thyroid dysfunction.  Pt with  previous history of hyperthyroidism not currently on medication.  Will wait on lab results to obtain abdominal and transvaginal ultrasound.  UA with trace leuks, protein, SG 1.030.  Will obtain UCX.  Given strict precautions.  Return in about 3 weeks (around 01/08/2023), or if symptoms worsen or fail to improve.   Deeann Saint, MD

## 2022-12-18 NOTE — Patient Instructions (Signed)
We will obtain labs and see how her thyroid is doing.  If labs are normal we will then proceed with ultrasound.  I have included some information about endometriosis and fibroids for you to review.  If needed we can have you follow-up virtually due to your schedule.

## 2022-12-19 LAB — T4, FREE: Free T4: 1 ng/dL (ref 0.8–1.8)

## 2022-12-19 LAB — CBC WITH DIFFERENTIAL/PLATELET
Absolute Monocytes: 317 {cells}/uL (ref 200–950)
Basophils Absolute: 19 {cells}/uL (ref 0–200)
Basophils Relative: 0.6 %
Eosinophils Absolute: 150 {cells}/uL (ref 15–500)
Eosinophils Relative: 4.7 %
HCT: 37.7 % (ref 35.0–45.0)
Hemoglobin: 11.9 g/dL (ref 11.7–15.5)
Lymphs Abs: 1232 {cells}/uL (ref 850–3900)
MCH: 27 pg (ref 27.0–33.0)
MCHC: 31.6 g/dL — ABNORMAL LOW (ref 32.0–36.0)
MCV: 85.5 fL (ref 80.0–100.0)
MPV: 11.7 fL (ref 7.5–12.5)
Monocytes Relative: 9.9 %
Neutro Abs: 1482 {cells}/uL — ABNORMAL LOW (ref 1500–7800)
Neutrophils Relative %: 46.3 %
Platelets: 240 10*3/uL (ref 140–400)
RBC: 4.41 10*6/uL (ref 3.80–5.10)
RDW: 13.5 % (ref 11.0–15.0)
Total Lymphocyte: 38.5 %
WBC: 3.2 10*3/uL — ABNORMAL LOW (ref 3.8–10.8)

## 2022-12-19 LAB — URINE CULTURE
MICRO NUMBER:: 15374049
Result:: NO GROWTH
SPECIMEN QUALITY:: ADEQUATE

## 2022-12-19 LAB — TSH: TSH: 3.62 m[IU]/L

## 2022-12-19 LAB — COMPREHENSIVE METABOLIC PANEL WITH GFR
AG Ratio: 1.2 (calc) (ref 1.0–2.5)
ALT: 21 U/L (ref 6–29)
AST: 21 U/L (ref 10–30)
Albumin: 4.2 g/dL (ref 3.6–5.1)
Alkaline phosphatase (APISO): 40 U/L (ref 31–125)
BUN: 11 mg/dL (ref 7–25)
CO2: 27 mmol/L (ref 20–32)
Calcium: 9.6 mg/dL (ref 8.6–10.2)
Chloride: 104 mmol/L (ref 98–110)
Creat: 0.87 mg/dL (ref 0.50–0.96)
Globulin: 3.4 g/dL (ref 1.9–3.7)
Glucose, Bld: 84 mg/dL (ref 65–99)
Potassium: 4.3 mmol/L (ref 3.5–5.3)
Sodium: 137 mmol/L (ref 135–146)
Total Bilirubin: 0.4 mg/dL (ref 0.2–1.2)
Total Protein: 7.6 g/dL (ref 6.1–8.1)

## 2022-12-19 LAB — T3, FREE: T3, Free: 2.9 pg/mL (ref 2.3–4.2)

## 2023-01-10 ENCOUNTER — Encounter: Payer: Self-pay | Admitting: Family Medicine

## 2023-02-01 ENCOUNTER — Other Ambulatory Visit: Payer: Self-pay | Admitting: Family Medicine

## 2023-02-01 DIAGNOSIS — N941 Unspecified dyspareunia: Secondary | ICD-10-CM

## 2023-02-01 DIAGNOSIS — R1032 Left lower quadrant pain: Secondary | ICD-10-CM

## 2023-02-02 ENCOUNTER — Telehealth: Payer: Self-pay

## 2023-02-02 NOTE — Telephone Encounter (Signed)
[facility] called in needing an additional diagnosis code to cover the abdomen complete ultrasound as the LLQ pain was being covered in the Pelvic ultrasound. They will use abdominal pain for the abdomen complete.

## 2023-02-04 ENCOUNTER — Ambulatory Visit
Admission: RE | Admit: 2023-02-04 | Discharge: 2023-02-04 | Disposition: A | Discharge status: Home / Self Care | Payer: 59 | Source: Ambulatory Visit | Attending: Family Medicine | Admitting: Family Medicine

## 2023-02-04 DIAGNOSIS — N941 Unspecified dyspareunia: Secondary | ICD-10-CM

## 2023-02-04 DIAGNOSIS — R1032 Left lower quadrant pain: Secondary | ICD-10-CM

## 2023-03-03 NOTE — Telephone Encounter (Signed)
Please see result note from the ultrasounds.  They were sent to pt at the end of October.

## 2023-03-04 ENCOUNTER — Encounter: Payer: Self-pay | Admitting: Family Medicine

## 2023-03-04 ENCOUNTER — Ambulatory Visit: Payer: 59 | Admitting: Family Medicine

## 2023-03-04 VITALS — BP 110/72 | HR 81 | Temp 98.8°F | Ht 64.0 in | Wt 195.8 lb

## 2023-03-04 DIAGNOSIS — R1032 Left lower quadrant pain: Secondary | ICD-10-CM | POA: Diagnosis not present

## 2023-03-04 DIAGNOSIS — G43909 Migraine, unspecified, not intractable, without status migrainosus: Secondary | ICD-10-CM | POA: Diagnosis not present

## 2023-03-04 DIAGNOSIS — K760 Fatty (change of) liver, not elsewhere classified: Secondary | ICD-10-CM | POA: Diagnosis not present

## 2023-03-04 DIAGNOSIS — D219 Benign neoplasm of connective and other soft tissue, unspecified: Secondary | ICD-10-CM | POA: Diagnosis not present

## 2023-03-04 MED ORDER — ONDANSETRON HCL 4 MG PO TABS: 4.0000 mg | ORAL_TABLET | Freq: Three times a day (TID) | ORAL | 0 refills | Status: DC | PRN

## 2023-03-04 MED ORDER — KETOROLAC TROMETHAMINE 60 MG/2ML IM SOLN
30.0000 mg | Freq: Once | INTRAMUSCULAR | Status: DC
Start: 2023-03-04 — End: 2023-03-04

## 2023-03-04 NOTE — Telephone Encounter (Signed)
Patient is coming in to see Dr. Salomon Fick 03/04/2023

## 2023-03-04 NOTE — Patient Instructions (Addendum)
GENERAL HEADACHE INSTRUCTIONS Headache Preventive Treatment: Please keep in mind that it takes 4-6 weeks for the medication to start working well and 2-3 months at the appropriate dose before deciding if it will be useful or not. If it is not helping at all by this time, then we will discuss other medications to try. Supplements may take 3-6 months until you see full effect.    Natural supplements: Magnesium Oxide or Magnesium Glycinate 500 mg at bed (up to 800 mg daily) Coenzyme Q10 300 mg in AM Vitamin B2- 200 mg twice a day   Add 1 supplement at a time since even natural supplements can have undesirable side effects. You can sometimes buy supplements cheaper (especially Coenzyme Q10) at www.https://compton-perez.com/ or at [employer].   Vitamins and herbs that show potential:   Magnesium: Magnesium (250 mg twice a day or 500 mg at bed) has a relaxant effect on smooth muscles such as blood vessels. Individuals suffering from frequent or daily headache usually have low magnesium levels which can be increase with daily supplementation of 400-750 mg. Three trials found 40-90% average headache reduction  when used as a preventative. Magnesium also demonstrated the benefit in menstrually related migraine.  Magnesium is part of the messenger system in the serotonin cascade and it is a good muscle relaxant.  It is also useful for constipation which can be a side effect of other medications used to treat migraine. Good sources include nuts, whole grains, and tomatoes. Side Effects: loose stool/diarrhea Riboflavin (vitamin B 2) 200 mg twice a day. This vitamin assists nerve cells in the production of ATP a principal energy storing molecule.  It is necessary for many chemical reactions in the body.  There have been at least 3 clinical trials of riboflavin using 400 mg per day all of which suggested that migraine frequency can be decreased.  All 3 trials showed significant improvement in over half of migraine sufferers.  The  supplement is found in bread, cereal, milk, meat, and poultry.  Most Americans get more riboflavin than the recommended daily allowance, however riboflavin deficiency is not necessary for the supplements to help prevent headache. Side effects: energizing, green urine   Coenzyme Q10: This is present in almost all cells in the body and is critical component for the conversion of energy.  Recent studies have shown that a nutritional supplement of CoQ10 can reduce the frequency of migraine attacks by improving the energy production of cells as with riboflavin.  Doses of 150 mg twice a day have been shown to be effective.   Melatonin: Increasing evidence shows correlation between melatonin secretion and headache conditions.  Melatonin supplementation has decreased headache intensity and duration.  It is widely used as a sleep aid.  Sleep is natures way of dealing with migraine.  A dose of 3 mg is recommended to start for headaches including cluster headache. Higher doses up to 15 mg has been reviewed for use in Cluster headache and have been used. The rationale behind using melatonin for cluster is that many theories regarding the cause of Cluster headache center around the disruption of the normal circadian rhythm in the brain.  This helps restore the normal circadian rhythm.     HEADACHE DIET: Foods and beverages which may trigger migraine Note that only 20% of headache patients are food sensitive. You will know if you are food sensitive if you get a headache consistently 20 minutes to 2 hours after eating a certain food. Only cut out a food  if it causes headaches, otherwise you might remove foods you enjoy! What matters most for diet is to eat a well balanced healthy diet full of vegetables and low fat protein, and to not miss meals.   Chocolate, other sweets ALL cheeses except cottage and cream cheese Dairy products, yogurt, sour cream, ice cream Liver Meat extracts (Bovril, Marmite, meat  tenderizers) Meats or fish which have undergone aging, fermenting, pickling or smoking. These include: Hotdogs,salami,Lox,sausage, mortadellas,smoked salmon, pepperoni, Pickled herring Pods of broad bean (English beans, Chinese pea pods, [place] (fava) beans, lima and navy beans Ripe avocado, ripe banana Yeast extracts or active yeast preparations such as Brewer's or Fleishman's (commercial bakes goods are permitted) Tomato based foods, pizza (lasagna, etc.)   MSG (monosodium glutamate) is disguised as many things; look for these common aliases: Monopotassium glutamate Autolysed yeast Hydrolysed protein Sodium caseinate "flavorings" "all natural preservatives" Nutrasweet   Avoid all other foods that convincingly provoke headaches.   Resources: The Dizzy Lu Duffel Your Headache Diet, migrainestrong.com  https://www.aguirre.org/   Caffeine and Migraine For patients that have migraine, caffeine intake more than 3 days per week can lead to dependency and increased migraine frequency. I would recommend cutting back on your caffeine intake as best you can. The recommended amount of caffeine is 200-300 mg daily, although migraine patients may experience dependency at even lower doses. While you may notice an increase in headache temporarily, cutting back will be helpful for headaches in the long run. For more information on caffeine and migraine, visit: https://americanmigrainefoundation.org/resource-library/caffeine-and-migraine/   Headache Prevention Strategies:   1. Maintain a headache diary; learn to identify and avoid triggers.  - This can be a simple note where you log when you had a headache, associated symptoms, and medications used - There are several smartphone apps developed to help track migraines: Migraine Buddy, Migraine Monitor, Curelator N1-Headache App   Common triggers include: Emotional triggers: Emotional/Upset family or  friends Emotional/Upset occupation Business reversal/success Anticipation anxiety Crisis-serious Post-crisis periodNew job/position   Physical triggers: Vacation Day Weekend Strenuous Exercise High Altitude Location New Move Menstrual Day Physical Illness Oversleep/Not enough sleep Weather changes Light: Photophobia or light sesnitivity treatment involves a balance between desensitization and reduction in overly strong input. Use dark polarized glasses outside, but not inside. Avoid bright or fluorescent light, but do not dim environment to the point that going into a normally lit room hurts. Consider FL-41 tint lenses, which reduce the most irritating wavelengths without blocking too much light.  These can be obtained at axonoptics.com or theraspecs.com Foods: see list above.   2. Limit use of acute treatments (over-the-counter medications, triptans, etc.) to no more than 2 days per week or 10 days per month to prevent medication overuse headache (rebound headache).     3. Follow a regular schedule (including weekends and holidays): Don't skip meals. Eat a balanced diet. 8 hours of sleep nightly. Minimize stress. Exercise 30 minutes per day. Being overweight is associated with a 5 times increased risk of chronic migraine. Keep well hydrated and drink 6-8 glasses of water per day.   4. Initiate non-pharmacologic measures at the earliest onset of your headache. Rest and quiet environment. Relax and reduce stress. Breathe2Relax is a free app that can instruct you on    some simple relaxtion and breathing techniques. Http://Dawnbuse.com is a    free website that provides teaching videos on relaxation.  Also, there are  many apps that   can be downloaded for "mindful" relaxation.  An app called  YOGA NIDRA will help walk you through mindfulness. Another app called Calm can be downloaded to give you a structured mindfulness guide with daily reminders and skill development. Headspace for  guided meditation Mindfulness Based Stress Reduction Online Course: www.palousemindfulness.com Cold compresses.   5. Don't wait!! Take the maximum allowable dosage of prescribed medication at the first sign of migraine.   6. Compliance:  Take prescribed medication regularly as directed and at the first sign of a migraine.   7. Communicate:  Call your physician when problems arise, especially if your headaches change, increase in frequency/severity, or become associated with neurological symptoms (weakness, numbness, slurred speech, etc.).   8. Headache/pain management therapies: Consider various complementary methods, including medication, behavioral therapy, psychological counselling, biofeedback, massage therapy, acupuncture, dry needling, and other modalities.  Such measures may reduce the need for medications. Counseling for pain management, where patients learn to function and ignore/minimize their pain, seems to work very well.   9. Recommend changing family's attention and focus away from patient's headaches. Instead, emphasize daily activities. If first question of day is 'How are your headaches/Do you have a headache today?', then patient will constantly think about headaches, thus making them worse. Goal is to re-direct attention away from headaches, toward daily activities and other distractions.   10. Helpful Websites: www.AmericanHeadacheSociety.org VoipObserver.it www.headaches.org GolfingFamily.no www.achenet.org   11. HEADACHE EXPECTATIONS: There are many types of headaches, and only a rare few in which complete relief can be expected.  In general, there is no cure for headache, especially migraine based headaches.  There is nothing available that completely prevents headaches from occurring, breaking through, or having periodic flare-ups and fluctuations.  Regardless of what you are using on a daily basis for prevention, episodic headaches should still be expected, and  periods where frequency may escalate and fluctuate are unavoidable.   There is no quick fix for most headaches.  Furthermore, the longer you have had high frequency headaches (such as chronic daily headache), the longer it will likely take to expect any improvement.  In fact, some people will never improve, regardless of how many medications or other treatments we try.  Our treatment strategy is to evaluate for possible causes of your headache, although testing is usually always normal, even in cases of daily continuous headaches for years.  Most types of headache such as migraine are electrical brain disorders (similar to how epilepsy is an electrical brain disorders).  Therefore, there is no testing that will reveal this "dysfunctional electrical circuitry" such on MRI, or other testing.  We try to find a medication that may help lessen the frequency and/or severity of your headaches.  The goal is not to completely stop them from happening, although if that happens, great!  Different people respond to different medications, and some people just don't respond to anything, so it's usually a matter of trying different options.  We cannot predict if or when exactly you will respond to a treatment that we provide.   Preventive headache medications take 4-6 weeks to start working, and 2-3 months to see full effect, assuming you reach an effective dose.  Therefore, calling or messaging frequently because you have a headache flare prior to the 3 month mark is unlikely to change anything, and unfortunately there is nothing available that will expedite this, so please try to avoid this.  Our recommendation will generally be to give it adequate time first.  If you are unable to wait it out for medications to work, we can  also try IV infusions for some temporary relief.     In general, the best that preventive medications or other treatments (including Botox) are able to offer in migraine management (variable in other  headache types) is a 50% improvement in frequency and/or severity of headache.  That is our goal, and any additional benefit is considered a bonus.  Some people do significantly better than this, others do not get close to this.  Therefore, if your headaches are not improving by at least 3 months on your preventive strategy, contact us and we can discuss further adjustments.  Keep in mind that complete headache cure is not a realistic expectation.

## 2023-03-04 NOTE — Progress Notes (Signed)
Established Patient Office Visit   Subjective  Patient ID: Sheri Foster, female    DOB: 1995/05/07  Age: 27 y.o. MRN: [id number]  Chief Complaint  Patient presents with   Abdominal Pain    Stomach pain follow-up, rate of pain 7 out of 10, cramps, sharp shooting pain     Pt is a 27 yo female seen for f/u.  Pt had u/s of abd and transvag 02/04/23.  A 4x3x3 cm fibroid noted.  Possible smaller fibroids vs adenomyosis also noted.  Hepatic steatosis also seen.  Pt still having LLQ pain, now occurring outside of menses.  Sensation of pressure and cramping.  May increase based on position or activity.   Can last 5-6 min if still or longer if moving.  Menses regular, not currently heavy.  Pt's mom with h/o hysterectomy 2/2 fibroids.  Pt with migraine HA causing pressure behind nose.  Took 3 advil this around lunch without relief.  Denies allergy symptoms.    Patient Active Problem List   Diagnosis Date Noted   Graves disease 05/31/2018   Hyperthyroidism 05/31/2018   Eczema 01/27/2018   Past Medical History:  Diagnosis Date   Depression    Migraines    History reviewed. No pertinent surgical history. Social History   Tobacco Use   Smoking status: Never   Smokeless tobacco: Never  Substance Use Topics   Alcohol use: Yes   Drug use: Never   Family History  Problem Relation Age of Onset   Asthma Mother    Hyperlipidemia Mother    Hypertension Mother    Hyperthyroidism Mother    Hypothyroidism Other    No Known Allergies    ROS Negative unless stated above    Objective:     BP 110/72 (BP Location: Right Arm, Patient Position: Sitting, Cuff Size: Normal)   Pulse 81   Temp 98.8 F (37.1 C) (Oral)   Ht 5\' 4"  (1.626 m)   Wt 195 lb 12.8 oz (88.8 kg)   LMP 02/14/2023 (Exact Date)   SpO2 97%   BMI 33.61 kg/m    Physical Exam Constitutional:      General: She is not in acute distress.    Appearance: Normal appearance.  HENT:     Head: Normocephalic and  atraumatic.     Nose: Nose normal.     Mouth/Throat:     Mouth: Mucous membranes are moist.  Cardiovascular:     Rate and Rhythm: Normal rate and regular rhythm.     Heart sounds: Normal heart sounds. No murmur heard.    No gallop.  Pulmonary:     Effort: Pulmonary effort is normal.     Breath sounds: Normal breath sounds. No wheezing.  Skin:    General: Skin is warm and dry.  Neurological:     Mental Status: She is alert and oriented to person, place, and time.    No results found for any visits on 03/04/23.    Assessment & Plan:  Fibroid -4x3x3 cm fibroid noted on u/s 02/04/23.  Additional smaller fibroids versus adenomyosis possible.  Ovaries normal. -     Ambulatory referral to Obstetrics / Gynecology  Hepatic steatosis -Abdominal ultrasound -Discussed lifestyle modifications -Given handout  LLQ abdominal pain -Continue left lower quadrant abdominal pain. -Discussed possible causes including constipation, fibroid, endometriosis -Will place referral to OB to evaluate recent fibroid noted on ultrasound.  Also consider GI referral for continued symptoms. -     Ambulatory referral to Obstetrics /  Gynecology  Migraine without status migrainosus, not intractable, unspecified migraine type -migrane prevention -offered toradol inj., pt declines. -     Ondansetron HCl; Take 1 tablet (4 mg total) by mouth every 8 (eight) hours as needed for nausea or vomiting.  Dispense: 20 tablet; Refill: 0  Return if symptoms worsen or fail to improve.   Deeann Saint, MD

## 2023-06-23 ENCOUNTER — Encounter: Payer: Self-pay | Admitting: Obstetrics and Gynecology

## 2023-06-23 ENCOUNTER — Other Ambulatory Visit: Payer: Self-pay

## 2023-06-23 ENCOUNTER — Ambulatory Visit: Payer: 59 | Admitting: Obstetrics and Gynecology

## 2023-06-23 VITALS — BP 124/88 | HR 81 | Wt 195.9 lb

## 2023-06-23 DIAGNOSIS — R102 Pelvic and perineal pain: Secondary | ICD-10-CM | POA: Diagnosis not present

## 2023-06-23 DIAGNOSIS — G8929 Other chronic pain: Secondary | ICD-10-CM | POA: Diagnosis not present

## 2023-06-23 DIAGNOSIS — N941 Unspecified dyspareunia: Secondary | ICD-10-CM

## 2023-06-23 DIAGNOSIS — Z1331 Encounter for screening for depression: Secondary | ICD-10-CM

## 2023-06-23 DIAGNOSIS — D219 Benign neoplasm of connective and other soft tissue, unspecified: Secondary | ICD-10-CM

## 2023-06-23 MED ORDER — CYCLOBENZAPRINE HCL 10 MG PO TABS: 10.0000 mg | ORAL_TABLET | Freq: Two times a day (BID) | ORAL | 2 refills | Status: AC | PRN

## 2023-06-23 NOTE — Progress Notes (Signed)
 NEW GYNECOLOGY PATIENT Patient name: Sheri Foster MRN [id number]  Date of birth: Jun 04, 1995 Chief Complaint:   Fibroids and Gynecologic Exam     History:  Sheri Foster is a 28 y.o. No obstetric history on file. being seen today for pelvic pain, dysmenorrhea, and fibroids. Recently found out she has fibroids. Aleve helps ease the pain when she has it.    Has been having pain for the majority of last year (about 1 year total). Has been having pain with intercourse, after intercourse; pain that happens randomly is the same pain that happens with sex. L > R lower abodminal pina, stabbing sensation pain. No prior pregnancy; intends for pregnancy in the future. Hyas not been on anything int eh past for menses, first day Is heay and then slows off the remainder of the days,  bleeds for 4-5 dasy. The flow has remained stable in this just the new onset pain.  Ebbs and flows for 2-3 minutes and then returns. Not daily pain, 1-2x a week, will happen spontaneously, will get more frequent closer to the period.  No other chronic pain triggers  Sexually active, female partner, receptive partner, does not mater position she may have the pain, will have pain continue for about 1 hour after intercourse; once had bleeding after intercourse and had a lot of pain  No abnormal vaginal discharge   Would have cramping and HA for first couple days and headache but nothing too bad No changes in bowel or urinary changes noted either       Gynecologic History Patient's last menstrual period was 06/21/2023 (exact date). Contraception:  same sex partner Last Pap:     Component Value Date/Time   DIAGPAP  01/31/2018 0000    NEGATIVE FOR INTRAEPITHELIAL LESIONS OR MALIGNANCY.   ADEQPAP  01/31/2018 0000    Satisfactory for evaluation  endocervical/transformation zone component PRESENT.    High Risk HPV: Positive  Adequacy:  Satisfactory for evaluation, transformation zone component  PRESENT  Diagnosis:  Atypical squamous cells of undetermined significance (ASC-US)  Last Mammogram: n/a Last Colonoscopy: n/a  Obstetric History OB History  No obstetric history on file.    Past Medical History:  Diagnosis Date   Depression    Migraines     No past surgical history on file.  No current outpatient medications on file prior to visit.   No current facility-administered medications on file prior to visit.    No Known Allergies  Social History:  reports that she has never smoked. She has never used smokeless tobacco. She reports current alcohol use. She reports that she does not use drugs.  Family History  Problem Relation Age of Onset   Asthma Mother    Hyperlipidemia Mother    Hypertension Mother    Hyperthyroidism Mother    Hypothyroidism Other     The following portions of the patient's history were reviewed and updated as appropriate: allergies, current medications, past family history, past medical history, past social history, past surgical history and problem list.  Review of Systems Pertinent items noted in HPI and remainder of comprehensive ROS otherwise negative.  Physical Exam:  BP 124/88   Pulse 81   Wt 195 lb 14.4 oz (88.9 kg)   LMP 06/21/2023 (Exact Date)   BMI 33.63 kg/m  Physical Exam Vitals and nursing note reviewed. Exam conducted with a chaperone present.  Constitutional:      Appearance: Normal appearance.  Pulmonary:     Effort: Pulmonary effort is  normal.  Abdominal:     Palpations: Abdomen is soft.     Comments: + [place] bilaterally   Genitourinary:    General: Normal vulva.     Exam position: Lithotomy position.  Neurological:     Mental Status: She is alert.        Assessment and Plan:   1. Chronic pelvic pain in female (Primary) Noted to have MSK findings on exam. Referral to PFPT and trial of muscle relaxer.  - Ambulatory referral to Physical Therapy - cyclobenzaprine (FLEXERIL) 10 MG tablet; Take 1  tablet (10 mg total) by mouth 2 (two) times daily as needed for muscle spasms.  Dispense: 30 tablet; Refill: 2  2. Dyspareunia in female - Ambulatory referral to Physical Therapy - cyclobenzaprine (FLEXERIL) 10 MG tablet; Take 1 tablet (10 mg total) by mouth 2 (two) times daily as needed for muscle spasms.  Dispense: 30 tablet; Refill: 2  3. Fibroid - Uterine fibroids: The patient's fibroids are symptomatic and treatment options of expectant management, medical therapy, and surgical therapy were discussed. - Expectant management - The patient's fibroids were discussed and expectant management was offered with strict precautions. - TXA - this medication was discussed as a means to control vaginal bleeding. Discussed it would not impact growth of the fibroids either way. Its main advantage is avoiding hormonal or surgical therapy and gives an option for therapy besides expectant management.  - We discussed progesterone only options including - POP, Depo Provera and Lng-IUD.  We reviewed risks and benefits and proper use. Progesterone Only Birth Control Pills (POP)- The use of progesterone only birth control pills was discussed with the patient. The control of fibroid symptoms was discussed and the risks/benefits of therapy were discussed. The fact that this type of therapy does not contain estrogen was discussed with the patient.  Proper use was also discussed. We reviewed possible induction of amenorrhea with each options as well as the possibility of break through bleeding.  - We discussed the GnRH-agonists and antagonists. Reviewed both short term and long term impact of these medications. Reviewed short term impact of Depo Lupron (agonist) on bleeding.  - We discussed surgical/procedural options available: RFA (I.e. Sonata), [place], myomectomy and hysterectomy. We discussed the risks and benefits for each of these specific procedures. For [place], recommended preop MRI and referral to interventional  radiology.  For the sonata, reviewed that we would need to sign a special consent form for this procedure. We discussed the types and sizes of fibroids that are candidates for hysteroscopic resection of fibroids as well.  - Following counseling, the patient would like to contemplate the surgical options further    Routine preventative health maintenance measures emphasized. Please refer to After Visit Summary for other counseling recommendations.   Follow-up: No follow-ups on file.      Lorriane Shire, MD Obstetrician & Gynecologist, [facility] [employer], [facility]

## 2023-06-23 NOTE — Patient Instructions (Addendum)
 Sonata - procedure to shrink the fibroid  Myomectomy - surgery to remove the fibroid

## 2023-07-26 ENCOUNTER — Encounter: Admitting: Family Medicine

## 2023-07-28 ENCOUNTER — Ambulatory Visit (INDEPENDENT_AMBULATORY_CARE_PROVIDER_SITE_OTHER): Admitting: Family Medicine

## 2023-07-28 VITALS — BP 120/78 | HR 100 | Temp 99.0°F | Ht 64.0 in | Wt 196.2 lb

## 2023-07-28 DIAGNOSIS — Z23 Encounter for immunization: Secondary | ICD-10-CM

## 2023-07-28 DIAGNOSIS — Z131 Encounter for screening for diabetes mellitus: Secondary | ICD-10-CM

## 2023-07-28 DIAGNOSIS — Z0000 Encounter for general adult medical examination without abnormal findings: Secondary | ICD-10-CM

## 2023-07-28 NOTE — Progress Notes (Signed)
 Established Patient Office Visit   Subjective  Patient ID: Sheri Foster, female    DOB: 1995-08-17  Age: 28 y.o. MRN: [id number]  Chief Complaint  Patient presents with   Annual Exam    Patient is a 28 year old female seen for CPE.  Patient states she is doing well overall.  Seen by OB/GYN for history of pelvic pain and fibroids.  To start PT in a few months.  Flexeril as needed seems to help symptoms.  Patient having a 28th birthday party in the next few weeks.    Patient Active Problem List   Diagnosis Date Noted   Graves disease 05/31/2018   Hyperthyroidism 05/31/2018   Eczema 01/27/2018   Past Medical History:  Diagnosis Date   Depression    Migraines    History reviewed. No pertinent surgical history. Social History   Tobacco Use   Smoking status: Never   Smokeless tobacco: Never  Substance Use Topics   Alcohol use: Yes   Drug use: Never   Family History  Problem Relation Age of Onset   Asthma Mother    Hyperlipidemia Mother    Hypertension Mother    Hyperthyroidism Mother    Hypothyroidism Other    No Known Allergies    ROS Negative unless stated above    Objective:     BP 120/78 (BP Location: Left Arm, Patient Position: Sitting, Cuff Size: Normal)   Pulse 100   Temp 99 F (37.2 C) (Oral)   Ht 5\' 4"  (1.626 m)   Wt 196 lb 3.2 oz (89 kg)   LMP 07/18/2023   SpO2 96%   BMI 33.68 kg/m  BP Readings from Last 3 Encounters:  07/28/23 120/78  06/23/23 124/88  03/04/23 110/72   Wt Readings from Last 3 Encounters:  07/28/23 196 lb 3.2 oz (89 kg)  06/23/23 195 lb 14.4 oz (88.9 kg)  03/04/23 195 lb 12.8 oz (88.8 kg)    Physical Exam Constitutional:      Appearance: Normal appearance.  HENT:     Head: Normocephalic and atraumatic.     Right Ear: Tympanic membrane, ear canal and external ear normal.     Left Ear: Tympanic membrane, ear canal and external ear normal.     Nose: Nose normal.     Mouth/Throat:     Mouth: Mucous membranes are  moist.     Pharynx: No oropharyngeal exudate or posterior oropharyngeal erythema.  Eyes:     General: No scleral icterus.    Extraocular Movements: Extraocular movements intact.     Conjunctiva/sclera: Conjunctivae normal.     Pupils: Pupils are equal, round, and reactive to light.  Neck:     Thyroid: No thyromegaly.  Cardiovascular:     Rate and Rhythm: Normal rate and regular rhythm.     Pulses: Normal pulses.     Heart sounds: Normal heart sounds. No murmur heard.    No friction rub.  Pulmonary:     Effort: Pulmonary effort is normal.     Breath sounds: Normal breath sounds. No wheezing, rhonchi or rales.  Abdominal:     General: Bowel sounds are normal.     Palpations: Abdomen is soft.     Tenderness: There is no abdominal tenderness.  Musculoskeletal:        General: No deformity. Normal range of motion.  Lymphadenopathy:     Cervical: No cervical adenopathy.  Skin:    General: Skin is warm and dry.     Findings: No  lesion.  Neurological:     General: No focal deficit present.     Mental Status: She is alert and oriented to person, place, and time.  Psychiatric:        Mood and Affect: Mood normal.        Thought Content: Thought content normal.       06/23/2023    5:14 PM 03/04/2023    4:44 PM 09/24/2021    3:10 PM  Depression screen PHQ 2/9  Decreased Interest 0 0 0  Down, Depressed, Hopeless 0 0 0  PHQ - 2 Score 0 0 0  Altered sleeping 0 0 0  Tired, decreased energy 0 0 0  Change in appetite 0 0 0  Feeling bad or failure about yourself  0 0 0  Trouble concentrating 0 0 0  Moving slowly or fidgety/restless 0  0  Suicidal thoughts 0 0 0  PHQ-9 Score 0 0 0      06/23/2023    5:14 PM 09/25/2020   10:35 AM 01/27/2018    5:20 PM  GAD 7 : Generalized Anxiety Score  Nervous, Anxious, on Edge 0 1 2  Control/stop worrying 0 1 2  Worry too much - different things 0 0 2  Trouble relaxing 0 0 1  Restless 0 0 1  Easily annoyed or irritable 0 0 1  Afraid - awful  might happen 0 0 1  Total GAD 7 Score 0 2 10  Anxiety Difficulty  Not difficult at all      No results found for any visits on 07/28/23.    Assessment & Plan:  Well adult exam -     CBC with Differential/Platelet; Future -     Comprehensive metabolic panel with GFR; Future -     Hemoglobin A1c; Future -     Lipid panel; Future -     TSH; Future -     T4, free; Future  Need for tetanus booster -     Tdap vaccine greater than or equal to 7yo IM  Age-appropriate health screenings discussed.  Will obtain labs.  Immunizations reviewed.  Tdap given this visit.  Next CPE in 1 year.  Return if symptoms worsen or fail to improve.   Deeann Saint, MD

## 2023-07-29 LAB — LIPID PANEL
Cholesterol: 156 mg/dL (ref 0–200)
HDL: 33.1 mg/dL — ABNORMAL LOW (ref >39.00)
LDL Cholesterol: 101 mg/dL — ABNORMAL HIGH (ref 0–99)
NonHDL: 123.19
Total CHOL/HDL Ratio: 5
Triglycerides: 110 mg/dL (ref 0.0–149.0)
VLDL: 22 mg/dL (ref 0.0–40.0)

## 2023-07-29 LAB — CBC WITH DIFFERENTIAL/PLATELET
Basophils Absolute: 0 10*3/uL (ref 0.0–0.1)
Basophils Relative: 1 % (ref 0.0–3.0)
Eosinophils Absolute: 0 10*3/uL (ref 0.0–0.7)
Eosinophils Relative: 1.3 % (ref 0.0–5.0)
HCT: 37.2 % (ref 36.0–46.0)
Hemoglobin: 12.1 g/dL (ref 12.0–15.0)
Lymphocytes Relative: 38.9 % (ref 12.0–46.0)
Lymphs Abs: 1.1 10*3/uL (ref 0.7–4.0)
MCHC: 32.6 g/dL (ref 30.0–36.0)
MCV: 83.7 fl (ref 78.0–100.0)
Monocytes Absolute: 0.2 10*3/uL (ref 0.1–1.0)
Monocytes Relative: 5.9 % (ref 3.0–12.0)
Neutro Abs: 1.4 10*3/uL (ref 1.4–7.7)
Neutrophils Relative %: 52.9 % (ref 43.0–77.0)
Platelets: 234 10*3/uL (ref 150.0–400.0)
RBC: 4.45 Mil/uL (ref 3.87–5.11)
RDW: 13.2 % (ref 11.5–15.5)
WBC: 2.7 10*3/uL — ABNORMAL LOW (ref 4.0–10.5)

## 2023-07-29 LAB — TSH: TSH: 2.64 u[IU]/mL (ref 0.35–5.50)

## 2023-07-29 LAB — COMPREHENSIVE METABOLIC PANEL WITH GFR
ALT: 20 U/L (ref 0–35)
AST: 23 U/L (ref 0–37)
Albumin: 4.3 g/dL (ref 3.5–5.2)
Alkaline Phosphatase: 36 U/L — ABNORMAL LOW (ref 39–117)
BUN: 10 mg/dL (ref 6–23)
CO2: 25 meq/L (ref 19–32)
Calcium: 9.4 mg/dL (ref 8.4–10.5)
Chloride: 103 meq/L (ref 96–112)
Creatinine, Ser: 0.88 mg/dL (ref 0.40–1.20)
GFR: 88.47 mL/min (ref >60.00)
Glucose, Bld: 71 mg/dL (ref 70–99)
Potassium: 3.6 meq/L (ref 3.5–5.1)
Sodium: 137 meq/L (ref 135–145)
Total Bilirubin: 0.7 mg/dL (ref 0.2–1.2)
Total Protein: 8 g/dL (ref 6.0–8.3)

## 2023-07-29 LAB — T4, FREE: Free T4: 0.89 ng/dL (ref 0.60–1.60)

## 2023-07-29 LAB — HEMOGLOBIN A1C: Hgb A1c MFr Bld: 5.7 % (ref 4.6–6.5)

## 2023-08-06 ENCOUNTER — Encounter: Payer: Self-pay | Admitting: Family Medicine

## 2023-09-06 NOTE — Therapy (Incomplete)
 OUTPATIENT PHYSICAL THERAPY FEMALE PELVIC EVALUATION   Patient Name: Sheri Foster MRN: [id number] DOB:Jun 29, 1995, 28 y.o., female Today's Date: 09/06/2023  END OF SESSION:   Past Medical History:  Diagnosis Date   Depression    Migraines    No past surgical history on file. Patient Active Problem List   Diagnosis Date Noted   Graves disease 05/31/2018   Hyperthyroidism 05/31/2018   Eczema 01/27/2018    PCP: Viola Greulich, MD  REFERRING PROVIDER: Kiki Pelton, MD  REFERRING DIAG: R10.2,G89.29 (ICD-10-CM) - Chronic pelvic pain in female N94.10 (ICD-10-CM) - Dyspareunia in female  Pelvic physical therapy - dyspareunia, and abdominal myalgia  THERAPY DIAG:  No diagnosis found.  Rationale for Evaluation and Treatment: {HABREHAB:27488}  ONSET DATE: ***  SUBJECTIVE:                                                                                                                                                                                           SUBJECTIVE STATEMENT: *** Fluid intake:   PAIN:  Are you having pain? {yes/no:20286} NPRS scale: ***/10 Pain location: {pelvic pain location:27098}  Pain type: {type:313116} Pain description: {PAIN DESCRIPTION:21022940}   Aggravating factors: *** Relieving factors: ***  PRECAUTIONS: {Therapy precautions:24002}  RED FLAGS: {PT Red Flags:29287}   WEIGHT BEARING RESTRICTIONS: {Yes ***/No:24003}  FALLS:  Has patient fallen in last 6 months? {fallsyesno:27318}  OCCUPATION: ***  ACTIVITY LEVEL : ***  PLOF: {PLOF:24004}  PATIENT GOALS: ***  PERTINENT HISTORY:  *** Sexual abuse: {Yes/No:304960894}  BOWEL MOVEMENT: Pain with bowel movement: {yes/no:20286} Type of bowel movement:{PT BM type:27100} Fully empty rectum: {No/Yes:304960894} Leakage: {Yes/No:304960894} Pads: {Yes/No:304960894} Fiber supplement/laxative {YES/NO AS:20300}  URINATION: Pain with urination: {yes/no:20286} Fully empty  bladder: {Yes/No:304960894}*** Stream: {PT urination:27102} Urgency: {YES/NO AS:20300} Frequency: *** Leakage: {PT leakage:27103} Pads: {Yes/No:304960894}  INTERCOURSE:  Ability to have vaginal penetration {YES/NO:21197} Pain with intercourse: {pain with intercourse PA:27099} Dryness{YES/NO AS:20300} Climax: *** Marinoff Scale: ***/3 Laxative:  PREGNANCY: Vaginal deliveries *** Tearing {Yes***/No:304960894} Episiotomy {YES/NO AS:20300} C-section deliveries *** Currently pregnant {Yes***/No:304960894}  PROLAPSE: {PT prolapse:27101}   OBJECTIVE:  Note: Objective measures were completed at Evaluation unless otherwise noted.  DIAGNOSTIC FINDINGS:  ***  PATIENT SURVEYS:  {rehab surveys:24030}  PFIQ-7: ***  COGNITION: Overall cognitive status: {cognition:24006}     SENSATION: Light touch: {intact/deficits:24005}  LUMBAR SPECIAL TESTS:  {lumbar special test:25242}  FUNCTIONAL TESTS:  {Functional tests:24029}  GAIT: Assistive device utilized: {Assistive devices:23999} Comments: ***  POSTURE: {posture:25561}   LUMBARAROM/PROM:  A/PROM A/PROM  eval  Flexion   Extension   Right lateral flexion   Left lateral flexion   Right rotation   Left rotation    (  Blank rows = not tested)  LOWER EXTREMITY ROM:  {AROM/PROM:27142} ROM Right eval Left eval  Hip flexion    Hip extension    Hip abduction    Hip adduction    Hip internal rotation    Hip external rotation    Knee flexion    Knee extension    Ankle dorsiflexion    Ankle plantarflexion    Ankle inversion    Ankle eversion     (Blank rows = not tested)  LOWER EXTREMITY MMT:  MMT Right eval Left eval  Hip flexion    Hip extension    Hip abduction    Hip adduction    Hip internal rotation    Hip external rotation    Knee flexion    Knee extension    Ankle dorsiflexion    Ankle plantarflexion    Ankle inversion    Ankle eversion     (Blank rows = not tested) PALPATION:   General:  ***  Pelvic Alignment: ***  Abdominal: ***                External Perineal Exam: ***                             Internal Pelvic Floor: ***  Patient confirms identification and approves PT to assess internal pelvic floor and treatment {yes/no:20286}  PELVIC MMT:   MMT eval  Vaginal   Internal Anal Sphincter   External Anal Sphincter   Puborectalis   Diastasis Recti   (Blank rows = not tested)        TONE: ***  PROLAPSE: ***  TODAY'S TREATMENT:                                                                                                                              DATE: ***  EVAL ***   PATIENT EDUCATION:  Education details: *** Person educated: {Person educated:25204} Education method: {Education Method:25205} Education comprehension: {Education Comprehension:25206}  HOME EXERCISE PROGRAM: ***  ASSESSMENT:  CLINICAL IMPRESSION: Patient is a *** y.o. *** who was seen today for physical therapy evaluation and treatment for ***.   OBJECTIVE IMPAIRMENTS: {opptimpairments:25111}.   ACTIVITY LIMITATIONS: {activitylimitations:27494}  PARTICIPATION LIMITATIONS: {participationrestrictions:25113}  PERSONAL FACTORS: {Personal factors:25162} are also affecting patient's functional outcome.   REHAB POTENTIAL: {rehabpotential:25112}  CLINICAL DECISION MAKING: {clinical decision making:25114}  EVALUATION COMPLEXITY: {Evaluation complexity:25115}   GOALS: Goals reviewed with patient? {yes/no:20286}  SHORT TERM GOALS: Target date: ***  *** Baseline: Goal status: INITIAL  2.  *** Baseline:  Goal status: INITIAL  3.  *** Baseline:  Goal status: INITIAL  4.  *** Baseline:  Goal status: INITIAL  5.  *** Baseline:  Goal status: INITIAL  6.  *** Baseline:  Goal status: INITIAL  LONG TERM GOALS: Target date: ***  *** Baseline:  Goal status: INITIAL  2.  *** Baseline:  Goal status: INITIAL  3.  *** Baseline:  Goal status: INITIAL  4.   *** Baseline:  Goal status: INITIAL  5.  *** Baseline:  Goal status: INITIAL  6.  *** Baseline:  Goal status: INITIAL  PLAN:  PT FREQUENCY: {rehab frequency:25116}  PT DURATION: {rehab duration:25117}  PLANNED INTERVENTIONS: {rehab planned interventions:25118::"97110-Therapeutic exercises","97530- Therapeutic [phone]- Neuromuscular re-education","97535- Self FAOZ","30865- Manual therapy"}  PLAN FOR NEXT SESSION: ***   Sinda Leedom, PT 09/06/2023, 12:07 PM

## 2023-09-07 ENCOUNTER — Ambulatory Visit: Admitting: Physical Therapy

## 2023-10-07 ENCOUNTER — Ambulatory Visit: Admitting: Obstetrics and Gynecology

## 2023-10-07 ENCOUNTER — Other Ambulatory Visit: Payer: Self-pay

## 2023-10-07 ENCOUNTER — Other Ambulatory Visit (HOSPITAL_COMMUNITY)
Admission: RE | Admit: 2023-10-07 | Discharge: 2023-10-07 | Disposition: A | Discharge status: Home / Self Care | Source: Ambulatory Visit | Attending: Obstetrics and Gynecology | Admitting: Obstetrics and Gynecology

## 2023-10-07 VITALS — BP 133/95 | HR 105 | Ht 64.0 in | Wt 193.6 lb

## 2023-10-07 DIAGNOSIS — N941 Unspecified dyspareunia: Secondary | ICD-10-CM | POA: Diagnosis not present

## 2023-10-07 DIAGNOSIS — Z01419 Encounter for gynecological examination (general) (routine) without abnormal findings: Secondary | ICD-10-CM

## 2023-10-07 DIAGNOSIS — R102 Pelvic and perineal pain: Secondary | ICD-10-CM

## 2023-10-07 DIAGNOSIS — Z124 Encounter for screening for malignant neoplasm of cervix: Secondary | ICD-10-CM

## 2023-10-07 DIAGNOSIS — G8929 Other chronic pain: Secondary | ICD-10-CM | POA: Diagnosis not present

## 2023-10-07 NOTE — Progress Notes (Signed)
 ANNUAL EXAM Patient name: Sheri Foster MRN [id number]  Date of birth: 02/21/1996 Chief Complaint:   Gynecologic Exam and HPV screening  History of Present Illness:   Sheri Foster is a 28 y.o. No obstetric history on file. being seen today for a routine annual exam.  Current complaints: annual  Menstrual concerns? No  monthly, no changes Breast or nipple changes? No  Contraception use? Yes female partner Sexually active? Yes   Scheduled for PFPT next month. Using the muscle relaxer, 0.5 tablet at a time every so often, decreased use due not having as much pain. Notes the pain is worse as menses approaches.   No LMP recorded.   The pregnancy intention screening data noted above was reviewed. Potential methods of contraception were discussed. The patient elected to proceed with No data recorded.   Last pap     Component Value Date/Time   DIAGPAP  01/31/2018 0000    NEGATIVE FOR INTRAEPITHELIAL LESIONS OR MALIGNANCY.   ADEQPAP  01/31/2018 0000    Satisfactory for evaluation  endocervical/transformation zone component PRESENT.   Last mammogram: n/a.  Last colonoscopy: n/a.      07/28/2023    3:51 PM 06/23/2023    5:14 PM 03/04/2023    4:44 PM 09/24/2021    3:10 PM 05/09/2021    9:10 AM  Depression screen PHQ 2/9  Decreased Interest 0 0 0 0 0  Down, Depressed, Hopeless 0 0 0 0 0  PHQ - 2 Score 0 0 0 0 0  Altered sleeping 0 0 0 0 0  Tired, decreased energy 0 0 0 0 0  Change in appetite 0 0 0 0 0  Feeling bad or failure about yourself  0 0 0 0 0  Trouble concentrating 0 0 0 0 0  Moving slowly or fidgety/restless 0 0  0 0  Suicidal thoughts 0 0 0 0 0  PHQ-9 Score 0 0 0 0 0  Difficult doing work/chores Not difficult at all            07/28/2023    3:52 PM 06/23/2023    5:14 PM 09/25/2020   10:35 AM 01/27/2018    5:20 PM  GAD 7 : Generalized Anxiety Score  Nervous, Anxious, on Edge 0 0 1 2  Control/stop worrying 0 0 1 2  Worry too much - different things 0 0 0 2   Trouble relaxing 0 0 0 1  Restless 0 0 0 1  Easily annoyed or irritable 0 0 0 1  Afraid - awful might happen 0 0 0 1  Total GAD 7 Score 0 0 2 10  Anxiety Difficulty Not difficult at all  Not difficult at all      Review of Systems:   Pertinent items are noted in HPI Denies any headaches, blurred vision, fatigue, shortness of breath, chest pain, abdominal pain, abnormal vaginal discharge/itching/odor/irritation, problems with periods, bowel movements, urination, or intercourse unless otherwise stated above. Pertinent History Reviewed:  Reviewed past medical,surgical, social and family history.  Reviewed problem list, medications and allergies. Physical Assessment:   Vitals:   10/07/23 1501  BP: (!) 133/95  Pulse: (!) 105  SpO2: 100%  Weight: 193 lb 9.6 oz (87.8 kg)  Height: 5' 4 (1.626 m)  There is no height or weight on file to calculate BMI.        Physical Examination:   General appearance - well appearing, and in no distress  Mental status - alert, oriented to  person, place, and time  Psych:  She has a normal mood and affect  Skin - warm and dry, normal color, no suspicious lesions noted  Chest - effort normal, all lung fields clear to auscultation bilaterally  Heart - normal rate and regular rhythm  Abdomen - soft, nontender, nondistended, no masses or organomegaly  Pelvic -  VULVA: normal appearing vulva with no masses, tenderness or lesions   VAGINA: normal appearing vagina with normal color and discharge, no lesions   CERVIX: normal appearing nulliparous cervix without discharge or lesions, mild CMT Nontender levator ani bilaterally   Thin prep pap is done with HR HPV cotesting  UTERUS: uterus is felt to be normal size, shape, consistency and nontender   Extremities:  No swelling or varicosities noted  Chaperone present for exam  No results found for this or any previous visit (from the past 24 hours).    Assessment & Plan:  1. Well woman exam with routine  gynecological exam (Primary) - Cervical cancer screening: Discussed guidelines. Pap with HPV collected - GC/CT: accepts - Birth Control: female partner - Breast Health: Encouraged self breast awareness/SBE. Teaching provided.  - F/U 12 months and prn  2. Screening for cervical cancer Pap collected - Cytology - PAP  3. Chronic pelvic pain in female 4. Dyspareunia in female Improved with prn muscle relaxer use. Has PFPT upcoming for additional treatment.    No orders of the defined types were placed in this encounter.   Meds: No orders of the defined types were placed in this encounter.   Follow-up: No follow-ups on file.  Kiki Pelton, MD 10/07/2023 1:40 PM

## 2023-10-12 LAB — CYTOLOGY - PAP
Chlamydia: NEGATIVE
Comment: NEGATIVE
Comment: NEGATIVE
Comment: NEGATIVE
Comment: NORMAL
Diagnosis: NEGATIVE
High risk HPV: NEGATIVE
Neisseria Gonorrhea: NEGATIVE
Trichomonas: NEGATIVE

## 2023-10-14 ENCOUNTER — Ambulatory Visit: Payer: Self-pay | Admitting: Obstetrics and Gynecology

## 2023-11-09 ENCOUNTER — Telehealth: Payer: Self-pay

## 2023-11-09 ENCOUNTER — Ambulatory Visit: Attending: Obstetrics and Gynecology

## 2023-11-09 NOTE — Therapy (Incomplete)
 OUTPATIENT PHYSICAL THERAPY FEMALE PELVIC EVALUATION   Patient Name: Sheri Foster MRN: [id number] DOB:[birth year], 28 y.o., female Today's Date: 11/09/2023  END OF SESSION:   Past Medical History:  Diagnosis Date   Depression    Migraines    No past surgical history on file. Patient Active Problem List   Diagnosis Date Noted   Graves disease 05/31/2018   Hyperthyroidism 05/31/2018   Eczema 01/27/2018    PCP: Mercer Clotilda SAUNDERS, MD  REFERRING PROVIDER: Jeralyn Crutch, MD  REFERRING DIAG: R10.2,G89.29 (ICD-10-CM) - Chronic pelvic pain in female N94.10 (ICD-10-CM) - Dyspareunia in female  THERAPY DIAG:  No diagnosis found.  Rationale for Evaluation and Treatment: Rehabilitation  ONSET DATE: ***  SUBJECTIVE:                                                                                                                                                                                           SUBJECTIVE STATEMENT: ***   PAIN:  Are you having pain? {yes/no:20286} NPRS scale: ***/10 Pain location: {pelvic pain location:27098}  Pain type: {type:313116} Pain description: {PAIN DESCRIPTION:21022940}   Aggravating factors: *** Relieving factors: ***  PRECAUTIONS: None  RED FLAGS: None   WEIGHT BEARING RESTRICTIONS: No  FALLS:  Has patient fallen in last 6 months? No  OCCUPATION: ***  ACTIVITY LEVEL : ***  PLOF: Independent  PATIENT GOALS: ***  PERTINENT HISTORY:  Graves disease, hyperthyroidism Sexual abuse: {Yes/No:304960894}  BOWEL MOVEMENT: Pain with bowel movement: {yes/no:20286} Type of bowel movement:{PT BM type:27100} Fully empty rectum: {No/Yes:304960894} Leakage: {Yes/No:304960894} Pads: {Yes/No:304960894} Fiber supplement/laxative {YES/NO AS:20300}  URINATION: Pain with urination: {yes/no:20286} Fully empty bladder: {Yes/No:304960894} Stream: {PT urination:27102} Urgency: {YES/NO AS:20300} Frequency: *** Fluid Intake:   Leakage: {PT leakage:27103} Pads: {Yes/No:304960894}  INTERCOURSE:  Ability to have vaginal penetration {YES/NO:21197} Pain with intercourse: {pain with intercourse PA:27099} Dryness{YES/NO AS:20300} Climax: *** Marinoff Scale: ***/3 Lubricant:  PREGNANCY: Vaginal deliveries *** Tearing {Yes***/No:304960894} Episiotomy {YES/NO AS:20300} C-section deliveries *** Currently pregnant {Yes***/No:304960894}  PROLAPSE: {PT prolapse:27101}   OBJECTIVE:  Note: Objective measures were completed at Evaluation unless otherwise noted.  11/09/23: PATIENT SURVEYS:   PFIQ-7: ***  COGNITION: Overall cognitive status: Within functional limits for tasks assessed     SENSATION: Light touch: Appears intact   FUNCTIONAL TESTS:  Squat: Single leg stance:  Rt:  Lt: Curl-up test:   GAIT: Assistive device utilized: None Comments: WNL  POSTURE: {posture:25561}   LUMBARAROM/PROM:  A/PROM A/PROM  Eval (% available)  Flexion   Extension   Right lateral flexion   Left lateral flexion   Right rotation   Left rotation    (Blank rows = not  tested)  PALPATION:   General: ***  Pelvic Alignment: ***  Abdominal: ***                External Perineal Exam: ***                             Internal Pelvic Floor: ***  Patient confirms identification and approves PT to assess internal pelvic floor and treatment {yes/no:20286}  PELVIC MMT:   MMT eval  Vaginal   Internal Anal Sphincter   External Anal Sphincter   Puborectalis   Diastasis Recti   (Blank rows = not tested)        TONE: ***  PROLAPSE: ***  TODAY'S TREATMENT:                                                                                                                              DATE:  11/09/23 EVAL  Manual:  Neuromuscular re-education:  Exercises:  Therapeutic activities:     PATIENT EDUCATION:  Education details: See above Person educated: Patient Education method: Explanation,  Demonstration, Tactile cues, Verbal cues, and Handouts Education comprehension: verbalized understanding  HOME EXERCISE PROGRAM: ***  ASSESSMENT:  CLINICAL IMPRESSION: Patient is a *** y.o. *** who was seen today for physical therapy evaluation and treatment for ***.   OBJECTIVE IMPAIRMENTS: decreased activity tolerance, decreased coordination, decreased endurance, decreased mobility, decreased ROM, decreased strength, increased fascial restrictions, increased muscle spasms, impaired flexibility, impaired tone, improper body mechanics, postural dysfunction, and pain.   ACTIVITY LIMITATIONS: {activitylimitations:27494}  PARTICIPATION LIMITATIONS: {participationrestrictions:25113}  PERSONAL FACTORS: {Personal factors:25162} are also affecting patient's functional outcome.   REHAB POTENTIAL: {rehabpotential:25112}  CLINICAL DECISION MAKING: {clinical decision making:25114}  EVALUATION COMPLEXITY: {Evaluation complexity:25115}   GOALS: Goals reviewed with patient? {yes/no:20286}  SHORT TERM GOALS: Target date: ***  Pt will be independent with HEP.   Baseline: Goal status: INITIAL  2.  *** Baseline:  Goal status: INITIAL  3.  *** Baseline:  Goal status: INITIAL  4.  *** Baseline:  Goal status: INITIAL  5.  *** Baseline:  Goal status: INITIAL  6.  *** Baseline:  Goal status: INITIAL  LONG TERM GOALS: Target date: ***  Pt will be independent with advanced HEP.   Baseline:  Goal status: INITIAL  2.  *** Baseline:  Goal status: INITIAL  3.  *** Baseline:  Goal status: INITIAL  4.  *** Baseline:  Goal status: INITIAL  5.  *** Baseline:  Goal status: INITIAL  6.  *** Baseline:  Goal status: INITIAL  PLAN:  PT FREQUENCY: 1-2x/week  PT DURATION: ***   PLANNED INTERVENTIONS: 97110-Therapeutic exercises, 97530- Therapeutic activity, 97112- Neuromuscular re-education, 97535- Self Care, 02859- Manual therapy, Dry Needling, and  Biofeedback  PLAN FOR NEXT SESSION: ***  Josette Mares, PT, DPT07/15/251:47 PM

## 2023-11-09 NOTE — Telephone Encounter (Signed)
 Called and left message for patient after no-show for pelvic floor physical therapy evaluation.

## 2023-12-23 ENCOUNTER — Ambulatory Visit: Admitting: Family Medicine

## 2023-12-23 ENCOUNTER — Encounter: Payer: Self-pay | Admitting: Family Medicine

## 2023-12-23 VITALS — BP 122/84 | HR 89 | Temp 99.1°F | Wt 200.2 lb

## 2023-12-23 DIAGNOSIS — R1310 Dysphagia, unspecified: Secondary | ICD-10-CM | POA: Diagnosis not present

## 2023-12-23 DIAGNOSIS — R590 Localized enlarged lymph nodes: Secondary | ICD-10-CM

## 2023-12-23 LAB — POCT RAPID STREP A (OFFICE): Rapid Strep A Screen: NEGATIVE

## 2023-12-23 NOTE — Progress Notes (Signed)
 Established Patient Office Visit   Subjective  Patient ID: Sheri Foster, female    DOB: July 30, 1995  Age: 28 y.o. MRN: [id number]  Chief Complaint  Patient presents with   Jaw Pain   Dysphagia    Pt is a 28 yo female seen for acute concern.  Pt noticed a bump under R ear that was tender.  Pt had dysphagia and mild soreness of throat.  Sensation was similar to right before you start to cry.  Symptoms have since resolved.  Pt denies fever, chills, rhinorrhea, ear pain/pressure.    Patient Active Problem List   Diagnosis Date Noted   Graves disease 05/31/2018   Hyperthyroidism 05/31/2018   Eczema 01/27/2018   Past Medical History:  Diagnosis Date   Depression    Migraines    History reviewed. No pertinent surgical history. Social History   Tobacco Use   Smoking status: Never   Smokeless tobacco: Never  Substance Use Topics   Alcohol use: Yes   Drug use: Never   Family History  Problem Relation Age of Onset   Asthma Mother    Hyperlipidemia Mother    Hypertension Mother    Hyperthyroidism Mother    Hypothyroidism Other    No Known Allergies  ROS Negative unless stated above    Objective:     BP 122/84   Pulse 89   Temp 99.1 F (37.3 C) (Oral)   Wt 200 lb 3.2 oz (90.8 kg)   SpO2 97%   BMI 34.36 kg/m  BP Readings from Last 3 Encounters:  12/23/23 122/84  10/07/23 (!) 133/95  07/28/23 120/78   Wt Readings from Last 3 Encounters:  12/23/23 200 lb 3.2 oz (90.8 kg)  10/07/23 193 lb 9.6 oz (87.8 kg)  07/28/23 196 lb 3.2 oz (89 kg)      Physical Exam Constitutional:      General: She is not in acute distress.    Appearance: Normal appearance.  HENT:     Head: Normocephalic and atraumatic.     Nose: Nose normal.     Mouth/Throat:     Mouth: Mucous membranes are moist.     Pharynx: No oropharyngeal exudate or posterior oropharyngeal erythema.  Neck:     Thyroid : No thyroid  mass or thyroid  tenderness.     Comments: R posterior auricular  lymphadenopathy., 1 mm mobile. Cardiovascular:     Rate and Rhythm: Normal rate and regular rhythm.     Heart sounds: Normal heart sounds. No murmur heard.    No gallop.  Pulmonary:     Effort: Pulmonary effort is normal. No respiratory distress.     Breath sounds: Normal breath sounds. No wheezing, rhonchi or rales.  Skin:    General: Skin is warm and dry.  Neurological:     Mental Status: She is alert and oriented to person, place, and time.        10/07/2023    3:07 PM 07/28/2023    3:51 PM 06/23/2023    5:14 PM  Depression screen PHQ 2/9  Decreased Interest 0 0 0  Down, Depressed, Hopeless 0 0 0  PHQ - 2 Score 0 0 0  Altered sleeping 0 0 0  Tired, decreased energy 0 0 0  Change in appetite 0 0 0  Feeling bad or failure about yourself  0 0 0  Trouble concentrating 0 0 0  Moving slowly or fidgety/restless 0 0 0  Suicidal thoughts 0 0 0  PHQ-9 Score 0 0  0  Difficult doing work/chores  Not difficult at all       10/07/2023    3:07 PM 07/28/2023    3:52 PM 06/23/2023    5:14 PM 09/25/2020   10:35 AM  GAD 7 : Generalized Anxiety Score  Nervous, Anxious, on Edge 0 0 0 1  Control/stop worrying 0 0 0 1  Worry too much - different things 0 0 0 0  Trouble relaxing 0 0 0 0  Restless 0 0 0 0  Easily annoyed or irritable 0 0 0 0  Afraid - awful might happen 0 0 0 0  Total GAD 7 Score 0 0 0 2  Anxiety Difficulty  Not difficult at all  Not difficult at all     Results for orders placed or performed in visit on 12/23/23  POC Rapid Strep A  Result Value Ref Range   Rapid Strep A Screen Negative Negative      Assessment & Plan:   Posterior auricular lymphadenopathy -     POCT rapid strep A  Dysphagia, unspecified type -     POCT rapid strep A  Acute swelling, TTP underneath R ear, and dysphagia likely due to a reactive posterior auricular lymph node.  Symptoms have since resolved.  POC strep test negative.  Continue to monitor.  For return of symptoms/sx that fail to  resolve obtain labs and imaging to further evaluate.  Given precautions.  Return if symptoms worsen or fail to improve.   Clotilda JONELLE Single, MD

## 2023-12-23 NOTE — Patient Instructions (Addendum)
 Your strep test was negative.  If you notice the lymph node returns/ does not fully resolve (remains sore, becomes larger, etc) let us  know so we can get an u/s and labs to further evaluate it.

## 2024-03-02 ENCOUNTER — Ambulatory Visit: Admitting: Family Medicine

## 2024-03-02 VITALS — BP 124/82 | HR 96 | Temp 97.9°F | Ht 64.0 in | Wt 200.0 lb

## 2024-03-02 DIAGNOSIS — G43909 Migraine, unspecified, not intractable, without status migrainosus: Secondary | ICD-10-CM

## 2024-03-02 MED ORDER — SUMATRIPTAN SUCCINATE 25 MG PO TABS: 25.0000 mg | ORAL_TABLET | ORAL | 2 refills | Status: DC | PRN

## 2024-03-02 NOTE — Progress Notes (Signed)
 Established Patient Office Visit   Subjective  Patient ID: Sheri Foster, female    DOB: December 04, 1995  Age: 28 y.o. MRN: [id number]  Chief Complaint  Patient presents with   Acute Visit    Patient came in today for a headache that started Oct 30th and lasted 5 days, patient states she was unable to work for home, look at the computer screen seem to make it worst     Pt is a 28 yo female seen for f/u on migraines.  Pt with a h/o frequent migraines during childhood that subsided until recently. Over the past 1-2 months, having 2-3 HAs/wk, with a severe episode lasting the entire weekend.  Pain was in the left side of head behind her eye and in parietal area.  Recent HA with R-sided pressure.  Bending over exacerbates the pain, causing it to shoot into her head. Pain 8.5- 9/10, impacting daily activities.  Sees 'sparklies' during the migraines, making it hard to focus. Denies nausea or vomiting, changes in diet, stress levels, sleep patterns, frequent NSAIDs or Tylenol  use.  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  1000 mg of Tylenol and a soda provided minimal relief.  She has not had her vision checked in a while but has an appointment scheduled for later today. Recalls being told in the past that her pupils might be contributing to her headaches. She reports difficulty seeing and acknowledges the need for glasses.   Patient Active Problem List   Diagnosis Date Noted   Graves disease 05/31/2018   Hyperthyroidism 05/31/2018   Eczema 01/27/2018   Past Medical History:  Diagnosis Date   Depression    Migraines    Thyroid  disease 2019   History reviewed. No pertinent surgical history. Social History   Tobacco Use   Smoking status: Never   Smokeless tobacco: Never  Substance Use Topics   Alcohol use: Yes   Drug use: Never   Family History  Problem Relation Age of Onset   Asthma Mother    Hyperlipidemia Mother    Hypertension Mother     Hyperthyroidism Mother    Hypothyroidism Other    No Known Allergies  ROS Negative unless stated above    Objective:     BP 124/82 (BP Location: Right Leg, Patient Position: Sitting, Cuff Size: Large)   Pulse 96   Temp 97.9 F (36.6 C) (Oral)   Ht 5' 4 (1.626 m)   Wt 200 lb (90.7 kg)   SpO2 100%   BMI 34.33 kg/m  BP Readings from Last 3 Encounters:  03/02/24 124/82  12/23/23 122/84  10/07/23 (!) 133/95   Wt Readings from Last 3 Encounters:  03/02/24 200 lb (90.7 kg)  12/23/23 200 lb 3.2 oz (90.8 kg)  10/07/23 193 lb 9.6 oz (87.8 kg)      Physical Exam Constitutional:      General: She is not in acute distress.    Appearance: Normal appearance.  HENT:     Head: Normocephalic and atraumatic.     Right Ear: Tympanic membrane normal.     Left Ear: Tympanic membrane normal.     Nose: Nose normal.     Mouth/Throat:     Mouth: Mucous membranes are moist.  Eyes:     General: Lids are normal. Vision grossly intact.     Extraocular Movements:     Right eye: Normal extraocular motion and no nystagmus.     Left eye: Normal  extraocular motion and no nystagmus.     Conjunctiva/sclera: Conjunctivae normal.  Cardiovascular:     Rate and Rhythm: Normal rate and regular rhythm.     Heart sounds: Normal heart sounds. No murmur heard.    No gallop.  Pulmonary:     Effort: Pulmonary effort is normal. No respiratory distress.     Breath sounds: Normal breath sounds. No wheezing, rhonchi or rales.  Skin:    General: Skin is warm and dry.  Neurological:     Mental Status: She is alert and oriented to person, place, and time.        03/02/2024    6:00 PM 10/07/2023    3:07 PM 07/28/2023    3:51 PM  Depression screen PHQ 2/9  Decreased Interest 0 0 0  Down, Depressed, Hopeless 0 0 0  PHQ - 2 Score 0 0 0  Altered sleeping 0 0 0  Tired, decreased energy 0 0 0  Change in appetite 0 0 0  Feeling bad or failure about yourself  0 0 0  Trouble concentrating 0 0 0  Moving  slowly or fidgety/restless 0 0 0  Suicidal thoughts 0 0 0  PHQ-9 Score 0 0  0   Difficult doing work/chores Not difficult at all  Not difficult at all     Data saved with a previous flowsheet row definition      03/02/2024    6:00 PM 10/07/2023    3:07 PM 07/28/2023    3:52 PM 06/23/2023    5:14 PM  GAD 7 : Generalized Anxiety Score  Nervous, Anxious, on Edge 0 0 0 0  Control/stop worrying 0 0 0 0  Worry too much - different things 0 0 0 0  Trouble relaxing 0 0 0 0  Restless 0 0 0 0  Easily annoyed or irritable 0 0 0 0  Afraid - awful might happen 0 0 0 0  Total GAD 7 Score 0 0 0 0  Anxiety Difficulty Not difficult at all  Not difficult at all      No results found for any visits on 03/02/24.    Assessment & Plan:   Migraine without status migrainosus, not intractable, unspecified migraine type -     SUMAtriptan  Succinate; Take 1 tablet (25 mg total) by mouth every 2 (two) hours as needed for migraine. May repeat in 2 hours if headache persists or recurs.  Dispense: 10 tablet; Refill: 2  Increased migraines.  Start triptan for migraine abortive therapy.  HA diary.  Continue other supportive care including sleep hygiene, healthy diet, increasing water intake, minimizing NSAID use, etc.  For continued or worsened sx migraine ppx and or referral to Neuro.  Return if symptoms worsen or fail to improve.   Clotilda JONELLE Single, MD

## 2024-03-15 ENCOUNTER — Emergency Department (HOSPITAL_COMMUNITY)

## 2024-03-15 ENCOUNTER — Other Ambulatory Visit: Payer: Self-pay

## 2024-03-15 ENCOUNTER — Encounter (HOSPITAL_COMMUNITY): Payer: Self-pay | Admitting: Emergency Medicine

## 2024-03-15 ENCOUNTER — Emergency Department (HOSPITAL_COMMUNITY)
Admission: EM | Admit: 2024-03-15 | Discharge: 2024-03-16 | Disposition: A | Discharge status: Home / Self Care | Attending: Emergency Medicine | Admitting: Emergency Medicine

## 2024-03-15 DIAGNOSIS — D72819 Decreased white blood cell count, unspecified: Secondary | ICD-10-CM | POA: Diagnosis not present

## 2024-03-15 DIAGNOSIS — R0602 Shortness of breath: Secondary | ICD-10-CM | POA: Insufficient documentation

## 2024-03-15 DIAGNOSIS — R079 Chest pain, unspecified: Secondary | ICD-10-CM | POA: Diagnosis present

## 2024-03-15 DIAGNOSIS — R519 Headache, unspecified: Secondary | ICD-10-CM | POA: Diagnosis not present

## 2024-03-15 LAB — BASIC METABOLIC PANEL WITH GFR
Anion gap: 15 (ref 5–15)
BUN: 8 mg/dL (ref 6–20)
CO2: 21 mmol/L — ABNORMAL LOW (ref 22–32)
Calcium: 8.7 mg/dL — ABNORMAL LOW (ref 8.9–10.3)
Chloride: 103 mmol/L (ref 98–111)
Creatinine, Ser: 0.79 mg/dL (ref 0.44–1.00)
GFR, Estimated: >60 mL/min (ref >60)
Glucose, Bld: 107 mg/dL — ABNORMAL HIGH (ref 70–99)
Potassium: 3.6 mmol/L (ref 3.5–5.1)
Sodium: 139 mmol/L (ref 135–145)

## 2024-03-15 LAB — CBC
HCT: 34.1 % — ABNORMAL LOW (ref 36.0–46.0)
Hemoglobin: 11 g/dL — ABNORMAL LOW (ref 12.0–15.0)
MCH: 27.6 pg (ref 26.0–34.0)
MCHC: 32.3 g/dL (ref 30.0–36.0)
MCV: 85.5 fL (ref 80.0–100.0)
Platelets: 218 K/uL (ref 150–400)
RBC: 3.99 MIL/uL (ref 3.87–5.11)
RDW: 13.9 % (ref 11.5–15.5)
WBC: 3.7 K/uL — ABNORMAL LOW (ref 4.0–10.5)
nRBC: 0 % (ref 0.0–0.2)

## 2024-03-15 LAB — TROPONIN I (HIGH SENSITIVITY)
Troponin I (High Sensitivity): 3 ng/L (ref <18)
Troponin I (High Sensitivity): 3 ng/L (ref <18)

## 2024-03-15 NOTE — ED Provider Triage Note (Signed)
 Emergency Medicine Provider Triage Evaluation Note  Sheri Foster , a 28 y.o. female  was evaluated in triage.  Pt complains of chest pain since 1730.  Denies hormonal contraceptive use.  Review of Systems  Positive: Chest pain, worse with inspiration, some SHOB Negative: Fever, chills, nausea, vomiting, abdominal pain, cough  Physical Exam  BP (!) 128/96 (BP Location: Right Arm)   Pulse 97   Temp 98.3 F (36.8 C)   Resp 20   SpO2 100%  Gen:   Awake, no distress   Resp:  Normal effort  MSK:   Moves extremities without difficulty  Other:    Medical Decision Making  Medically screening exam initiated at 8:28 PM.  Appropriate orders placed.  Sheri Foster was informed that the remainder of the evaluation will be completed by another provider, this initial triage assessment does not replace that evaluation, and the importance of remaining in the ED until their evaluation is complete.  Labs and imaging ordered   Sheri Foster, Sheri Foster 03/15/24 2030

## 2024-03-15 NOTE — ED Triage Notes (Signed)
 Patient c/o squeezing chest pain since 1730, getting worse to a 7/10, worse with inspiration. Patient has hx of graves disease.

## 2024-03-15 NOTE — ED Triage Notes (Signed)
 Patient from home w/ chest pain centrally since 1730 tonight while she was finishing her work. This pain also feels like its going towards her back as well to the left side.  She wasn't doing anything strenuous. Endorses shortness of breath with the chest pain. Denies abd pain, fevers, chills, cough. Denies n/v/d.

## 2024-03-16 MED ORDER — ALUM & MAG HYDROXIDE-SIMETH 200-200-20 MG/5ML PO SUSP
30.0000 mL | Freq: Once | ORAL | Status: AC
  Administered 2024-03-16: 30 mL via ORAL
  Filled 2024-03-16: qty 30

## 2024-03-16 MED ORDER — IBUPROFEN 400 MG PO TABS
600.0000 mg | ORAL_TABLET | Freq: Once | ORAL | Status: AC
  Administered 2024-03-16: 600 mg via ORAL
  Filled 2024-03-16: qty 1

## 2024-03-16 NOTE — ED Provider Notes (Signed)
  EMERGENCY DEPARTMENT AT [facility] Provider Note   CSN: [id number] Arrival date & time: 03/15/24  2019     Patient presents with: Chest Pain and Shortness of Breath   Sheri Foster is a 28 y.o. female.   28 year old female presenting with chest pain.  Patient reports chest pain that began around 5:30 PM, describes location of pain as substernal without radiation, pain is worse with taking a deep breath, pain is associated with shortness of breath and frequent belching.  Denies cough, congestion, fever, lower extremity edema, abdominal pain.  She has a history of migraines and has recently been started on sumatriptan, she endorses a headache behind my eyes currently that is not associated with any vision changes or dizziness, she took Tylenol yesterday evening without much relief of her symptoms.   Chest Pain Associated symptoms: shortness of breath   Shortness of Breath Associated symptoms: chest pain        Prior to Admission medications   Medication Sig Start Date End Date Taking? Authorizing Provider  cyclobenzaprine  (FLEXERIL ) 10 MG tablet Take 1 tablet (10 mg total) by mouth 2 (two) times daily as needed for muscle spasms. 06/23/23   Ajewole, Christana, MD  SUMAtriptan (IMITREX) 25 MG tablet Take 1 tablet (25 mg total) by mouth every 2 (two) hours as needed for migraine. May repeat in 2 hours if headache persists or recurs. 03/02/24   Mercer Clotilda SAUNDERS, MD    Allergies: Patient has no known allergies.    Review of Systems  Respiratory:  Positive for shortness of breath.   Cardiovascular:  Positive for chest pain.    Updated Vital Signs  Vitals:   03/15/24 2024 03/15/24 2029 03/16/24 0046  BP: (!) 128/96  127/78  Pulse: 97  78  Resp: 20  16  Temp: 98.3 F (36.8 C)  98.4 F (36.9 C)  SpO2: 100%  100%  Weight:  90 kg   Height:  5' 4 (1.626 m)      Physical Exam Vitals and nursing note reviewed.  HENT:     Head: Normocephalic.  Eyes:      Extraocular Movements: Extraocular movements intact.  Cardiovascular:     Rate and Rhythm: Normal rate and regular rhythm.     Heart sounds: Normal heart sounds.  Pulmonary:     Effort: Pulmonary effort is normal.     Breath sounds: Normal breath sounds.  Musculoskeletal:     Cervical back: Normal range of motion.     Right lower leg: No edema.     Left lower leg: No edema.     Comments: Moves all extremities spontaneously without difficulty  Skin:    General: Skin is warm and dry.  Neurological:     Mental Status: She is alert and oriented to person, place, and time.     (all labs ordered are listed, but only abnormal results are displayed) Labs Reviewed  BASIC METABOLIC PANEL WITH GFR - Abnormal; Notable for the following components:      Result Value   CO2 21 (*)    Glucose, Bld 107 (*)    Calcium 8.7 (*)    All other components within normal limits  CBC - Abnormal; Notable for the following components:   WBC 3.7 (*)    Hemoglobin 11.0 (*)    HCT 34.1 (*)    All other components within normal limits  TROPONIN I (HIGH SENSITIVITY)  TROPONIN I (HIGH SENSITIVITY)    EKG: EKG  Interpretation Date/Time:  Wednesday March 15 2024 20:29:31 EST Ventricular Rate:  99 PR Interval:  164 QRS Duration:  80 QT Interval:  370 QTC Calculation: 474 R Axis:   77  Text Interpretation: Normal sinus rhythm Normal ECG When compared with ECG of 11-Jan-2019 11:02, No significant change was found Confirmed by Raford Lenis ([id number]) on 03/15/2024 11:58:01 PM  Radiology: [facility] Chest 1 View Result Date: 03/15/2024 EXAM: 1 VIEW(S) XRAY OF THE CHEST 03/15/2024 08:45:00 PM COMPARISON: CXR 01/11/2019. CLINICAL HISTORY: Chest pain. FINDINGS: LUNGS AND PLEURA: Question vague left costophrenic angle airspace opacity. No pneumothorax. HEART AND MEDIASTINUM: No acute abnormality of the cardiac and mediastinal silhouettes. BONES AND SOFT TISSUES: No acute osseous abnormality. IMPRESSION: 1. Question  vague left costophrenic angle airspace opacity. Electronically signed by: Morgane Naveau MD 03/15/2024 08:54 PM EST RP Workstation: [id number]     Procedures   Medications Ordered in the ED  ibuprofen (ADVIL) tablet 600 mg (600 mg Oral Given 03/16/24 0255)  alum & mag hydroxide-simeth (MAALOX/MYLANTA) 200-200-20 MG/5ML suspension 30 mL (30 mLs Oral Given 03/16/24 0256)                                    Medical Decision Making This patient presents to the ED for concern of chest pain and shortness of breath, this involves an extensive number of treatment options, and is a complaint that carries with it a high risk of complications and morbidity.  The differential diagnosis includes ACS, pulmonary embolism, pneumonia, costochondritis, GERD, anxiety   Co morbidities that complicate the patient evaluation  History of Graves' disease and migraines   Additional history obtained:  Additional history obtained from record review External records from outside source obtained and reviewed including prior PCP note   Lab Tests:  I Ordered, and personally interpreted labs.  The pertinent results include: CBC notable for leukopenia with white blood cell count of 3.7, this is largely stable from her baseline; hemoglobin of 11 has downtrended from most recent baseline of 12.1 from 7 months ago.  BMP unremarkable. Initial troponin of 3, repeat remains unchanged.    Imaging Studies ordered:  I ordered imaging studies including CXR  I independently visualized and interpreted imaging which showed 1. Question vague left costophrenic angle airspace opacity.  I agree with the radiologist interpretation   Cardiac Monitoring: / EKG:  The patient was maintained on a cardiac monitor.  I personally viewed and interpreted the cardiac monitored which showed an underlying rhythm of: NSR   Problem List / ED Course / Critical interventions / Medication management  I ordered medication including  ibuprofen for headache and Maalox for chest pain/frequent belching that may be reflective of GERD Reevaluation of the patient after these medicines showed that the patient improved I have reviewed the patients home medicines and have made adjustments as needed   Social Determinants of Health:  Insufficient physical activity, social isolation   Test / Admission - Considered:  Physical exam is largely unremarkable as above, low suspicion for ACS given reassuring workup as above, patient was found to have a questionable airspace opacity in the left costophrenic angle, she has not had any signs/symptoms that would raise suspicion for pneumonia, including cough or fever, at this point I do not feel that treating her with antibiotics is necessary given my low suspicion for pneumonia, I discussed this with the patient and she is in agreement.   Vitals are  within normal limits, patient is afebrile and without tachycardia, she is maintaining her oxygen saturation on room air without difficulty. She is PERC negative.   Patient endorses frequent belching with her chest discomfort, this raises my suspicion that there may be a GERD component to her complaint today, will provide Maalox and reassess for symptomatic improvement.  Patient does not notice much improvement after use of Maalox at time my reexamination, however she reports that when she lies still she does not feel any chest discomfort or shortness of breath. Patient also endorses a headache, she has a history of migraines for which she takes sumatriptan, she tried Tylenol yesterday evening without much relief of her symptoms, I discussed administration of sumatriptan here for management of her headache however she notes that this does cause her to become drowsy and she would prefer to try different medication, will administer ibuprofen and reassess. Patient endorses improvement in her headache symptoms after receiving ibuprofen. Low suspicion for ACS  given reassuring workup as above, unsure if there is a musculoskeletal aspect contributing to her pain today or GERD given the frequent belching as well, at this time I do not feel that additional workup is necessary.  I discussed this in depth with the patient who voiced understanding, strict return precautions discussed, she is appropriate for discharge at this time.   Risk OTC drugs.        Final diagnoses:  Chest pain, unspecified type    ED Discharge Orders     None          Glendia Rocky SAILOR, [place] 03/16/24 [id number]    Raford Lenis, MD 03/16/24 [phone]

## 2024-03-16 NOTE — Discharge Instructions (Addendum)
 Your workup in the emergency department today is reassuring, your chest x-ray does show a possible airspace opacity, this can be found with conditions like pneumonia. Given the fact that you do not have any symptoms like a cough or fever I have a low suspicion that this is contributing to your symptoms today, I do not feel that you need to be treated with antibiotics at this time.  Return to the emergency department if your symptoms worsen.  Follow-up with your PCP as needed.

## 2024-03-26 ENCOUNTER — Encounter: Payer: Self-pay | Admitting: Family Medicine

## 2024-03-29 ENCOUNTER — Inpatient Hospital Stay: Admitting: Family Medicine

## 2024-04-03 ENCOUNTER — Encounter: Payer: Self-pay | Admitting: Family Medicine

## 2024-04-03 ENCOUNTER — Ambulatory Visit: Admitting: Family Medicine

## 2024-04-03 VITALS — BP 124/80 | HR 100 | Temp 98.4°F | Ht 64.0 in

## 2024-04-03 DIAGNOSIS — R040 Epistaxis: Secondary | ICD-10-CM | POA: Diagnosis not present

## 2024-04-03 DIAGNOSIS — D649 Anemia, unspecified: Secondary | ICD-10-CM

## 2024-04-03 DIAGNOSIS — R0789 Other chest pain: Secondary | ICD-10-CM

## 2024-04-03 DIAGNOSIS — K120 Recurrent oral aphthae: Secondary | ICD-10-CM | POA: Diagnosis not present

## 2024-04-03 DIAGNOSIS — G43909 Migraine, unspecified, not intractable, without status migrainosus: Secondary | ICD-10-CM | POA: Diagnosis not present

## 2024-04-03 LAB — CBC WITH DIFFERENTIAL/PLATELET
Basophils Absolute: 0 K/uL (ref 0.0–0.1)
Basophils Relative: 0.3 % (ref 0.0–3.0)
Eosinophils Absolute: 0.1 K/uL (ref 0.0–0.7)
Eosinophils Relative: 2.1 % (ref 0.0–5.0)
HCT: 36.5 % (ref 36.0–46.0)
Hemoglobin: 12.2 g/dL (ref 12.0–15.0)
Lymphocytes Relative: 30.7 % (ref 12.0–46.0)
Lymphs Abs: 0.9 K/uL (ref 0.7–4.0)
MCHC: 33.4 g/dL (ref 30.0–36.0)
MCV: 82.5 fl (ref 78.0–100.0)
Monocytes Absolute: 0.1 K/uL (ref 0.1–1.0)
Monocytes Relative: 4.6 % (ref 3.0–12.0)
Neutro Abs: 1.9 K/uL (ref 1.4–7.7)
Neutrophils Relative %: 62.3 % (ref 43.0–77.0)
Platelets: 237 K/uL (ref 150.0–400.0)
RBC: 4.43 Mil/uL (ref 3.87–5.11)
RDW: 14 % (ref 11.5–15.5)
WBC: 3 K/uL — ABNORMAL LOW (ref 4.0–10.5)

## 2024-04-03 LAB — TSH: TSH: 2.06 u[IU]/mL (ref 0.35–5.50)

## 2024-04-03 LAB — T4, FREE: Free T4: 0.71 ng/dL (ref 0.60–1.60)

## 2024-04-03 MED ORDER — UBRELVY 50 MG PO TABS: ORAL_TABLET | ORAL | 3 refills | Status: AC

## 2024-04-03 NOTE — Progress Notes (Signed)
 Established Patient Office Visit   Subjective  Patient ID: Tanganyika T. Polkowski, female    DOB: 11-11-1995  Age: 28 y.o. MRN: [id number]  Chief Complaint  Patient presents with   Medical Management of Chronic Issues    Patient came in today for a ED follow-up, for chest pain, no longer has chest pain.  ED gave patient IB profen.     Patient is a 28 year old female seen for ED follow-up and ongoing concerns.  Patient seen in ED on 11/19 for chest discomfort noted as a pressure/tightness worse after laying down.  Felt like heart had a hard time breathing when laying on side.  In ED labs and imaging negative.  Symptoms thought 2/2 heartburn.  Patient states she has a history of heartburn and this does not feel the same.  Patient concerned something is wrong with her.  Denies increased anxiety.  Patient endorses continued migraines increasing in frequency.  Imitrex  started 11/6 which is not helping.  Yesterday had a headache that lasted all day.  Endorses having 4-5 headaches since last visit.  Pain was in the left side of head and parietal.  Then moved to right side.  During migraine sees sparkles in vision.  Got new glasses after last OFV but has not noticed a change in headaches.  Patient denies any noticeable changes in sleep, hydration, foods, stress that may be contributing to symptoms  Patient also mentions developing painful blisters in mouth after eating certain foods such as pineapple, cheeseburger, popsicle, kale salad.  Patient states the painful blisters burst then appears red spots in mouth.  Also mentions having nosebleeds.    Patient Active Problem List   Diagnosis Date Noted   Graves disease 05/31/2018   Hyperthyroidism 05/31/2018   Eczema 01/27/2018   Past Medical History:  Diagnosis Date   Depression    Migraines    Thyroid  disease 2019   History reviewed. No pertinent surgical history. Social History   Tobacco Use   Smoking status: Never   Smokeless tobacco: Never   Substance Use Topics   Alcohol use: Yes   Drug use: Never   Family History  Problem Relation Age of Onset   Asthma Mother    Hyperlipidemia Mother    Hypertension Mother    Hyperthyroidism Mother    Hypothyroidism Other    No Known Allergies  ROS Negative unless stated above    Objective:     BP 124/80 (BP Location: Left Arm, Patient Position: Sitting, Cuff Size: Large)   Pulse 100   Temp 98.4 F (36.9 C) (Oral)   Ht 5' 4 (1.626 m)   LMP 03/01/2024 (Approximate)   SpO2 99%   BMI 34.06 kg/m  BP Readings from Last 3 Encounters:  04/03/24 124/80  03/16/24 127/78  03/02/24 124/82   Wt Readings from Last 3 Encounters:  03/15/24 198 lb 6.6 oz (90 kg)  03/02/24 200 lb (90.7 kg)  12/23/23 200 lb 3.2 oz (90.8 kg)      Physical Exam Constitutional:      General: She is not in acute distress.    Appearance: Normal appearance.  HENT:     Head: Normocephalic and atraumatic.     Right Ear: Tympanic membrane normal.     Left Ear: Tympanic membrane normal.     Nose: Nose normal.     Mouth/Throat:     Mouth: Mucous membranes are moist.     Comments: Pinpoint areas of erythema on soft palate.  No ulcers noted.  Eyes:     Extraocular Movements: Extraocular movements intact.     Conjunctiva/sclera: Conjunctivae normal.     Pupils: Pupils are equal, round, and reactive to light.  Cardiovascular:     Rate and Rhythm: Normal rate and regular rhythm.     Heart sounds: Normal heart sounds. No murmur heard.    No gallop.  Pulmonary:     Effort: Pulmonary effort is normal. No respiratory distress.     Breath sounds: Normal breath sounds. No wheezing, rhonchi or rales.  Musculoskeletal:     Cervical back: No tenderness.  Lymphadenopathy:     Cervical: No cervical adenopathy.  Skin:    General: Skin is warm and dry.  Neurological:     Mental Status: She is alert and oriented to person, place, and time.        04/03/2024   11:25 AM 03/02/2024    6:00 PM 10/07/2023     3:07 PM  Depression screen PHQ 2/9  Decreased Interest 0 0 0  Down, Depressed, Hopeless 0 0 0  PHQ - 2 Score 0 0 0  Altered sleeping 0 0 0  Tired, decreased energy 0 0 0  Change in appetite 0 0 0  Feeling bad or failure about yourself  0 0 0  Trouble concentrating 0 0 0  Moving slowly or fidgety/restless 0 0 0  Suicidal thoughts 0 0 0  PHQ-9 Score 0 0 0   Difficult doing work/chores  Not difficult at all      Data saved with a previous flowsheet row definition      04/03/2024   11:25 AM 03/02/2024    6:00 PM 10/07/2023    3:07 PM 07/28/2023    3:52 PM  GAD 7 : Generalized Anxiety Score  Nervous, Anxious, on Edge 0 0 0 0  Control/stop worrying 0 0 0 0  Worry too much - different things 0 0 0 0  Trouble relaxing 0 0 0 0  Restless 0 0 0 0  Easily annoyed or irritable 0 0 0 0  Afraid - awful might happen 0 0 0 0  Total GAD 7 Score 0 0 0 0  Anxiety Difficulty  Not difficult at all  Not difficult at all     No results found for any visits on 04/03/24.    Assessment & Plan:   Migraine without status migrainosus, not intractable, unspecified migraine type -     Ubrelvy ; Take 1 tablet (50 mg) at start of migraine.  May repeat dose in 2 hours if headache persists or recurs.  Dispense: 30 tablet; Refill: 3 -     Ambulatory referral to Neurology -     CT HEAD WO CONTRAST ( ); Future -     TSH; Future -     T4, free; Future  Epistaxis  Chest pressure -     TSH; Future -     T4, free; Future  Aphthous ulcer of mouth  Hypocalcemia -     PTH, intact and calcium; Future  Anemia, unspecified type -     Iron, TIBC and Ferritin Panel; Future -     CBC with Differential/Platelet; Future  Continued migraine symptoms despite use of Imitrex .  Stop Imitrex .  Start Ubrelvy .  CT head without contrast ordered.  Referral to neurology placed.  Patient to keep headache diary.  Epistaxis possibly 2/2 dry air from heat.  Can use OTC ayr nasal saline gel.  Normotensive.  Continue to  monitor.  Chest pressure  resolved. ED labs, notes, imaging reviewed.  CXR 1 v with question of vague left costophrenic angle airspace opacity.  For return of symptoms obtain CXR 2 view.  Ulcers of mouth resolved.  Advised certain foods can cause symptoms.  Continue to monitor.  Hypocalcemia, 8.7 and mild anemia H/H 11/34.1  noted on labs from ED.  Recheck.  Return if symptoms worsen or fail to improve.   Clotilda JONELLE Single, MD

## 2024-04-03 NOTE — Patient Instructions (Signed)
 Referral to neurology was placed.  They will contact you in regards to scheduling the appointment.  An order for CT head was also placed they will contact you as well.  A new prescription for headache medicine called Ubrelvy  was sent to your pharmacy.  You can try taking 1 at the start of a headache and repeat the dose in 2 hours if needed.  Continue increasing water and fluid intake.

## 2024-04-04 ENCOUNTER — Inpatient Hospital Stay: Admission: RE | Admit: 2024-04-04 | Discharge: 2024-04-04 | Attending: Family Medicine

## 2024-04-04 DIAGNOSIS — G43909 Migraine, unspecified, not intractable, without status migrainosus: Secondary | ICD-10-CM

## 2024-04-04 LAB — IRON,TIBC AND FERRITIN PANEL
%SAT: 16 % (ref 16–45)
Ferritin: 55 ng/mL (ref 16–154)
Iron: 49 ug/dL (ref 40–190)
TIBC: 311 ug/dL (ref 250–450)

## 2024-04-04 LAB — EXTRA SPECIMEN

## 2024-04-04 LAB — PTH, INTACT AND CALCIUM
Calcium: 9.1 mg/dL (ref 8.6–10.2)
PTH: 29 pg/mL (ref 16–77)

## 2024-04-06 ENCOUNTER — Ambulatory Visit: Payer: Self-pay | Admitting: Family Medicine

## 2024-04-11 ENCOUNTER — Telehealth: Payer: Self-pay

## 2024-04-11 ENCOUNTER — Other Ambulatory Visit (HOSPITAL_COMMUNITY): Payer: Self-pay

## 2024-04-11 NOTE — Telephone Encounter (Signed)
 Pharmacy Patient Advocate Encounter   Received notification from Onbase that prior authorization for Ubrelvy  50 tabs is required/requested.   Insurance verification completed.   The patient is insured through [employer].   Per test claim: PA required; PA submitted to above mentioned insurance via Latent Key/confirmation #/[id number] [employer] Status is pending

## 2024-04-25 ENCOUNTER — Emergency Department (HOSPITAL_COMMUNITY): Admission: EM | Admit: 2024-04-25 | Discharge: 2024-04-25 | Discharge status: Against Medical Advice

## 2024-04-25 ENCOUNTER — Other Ambulatory Visit: Payer: Self-pay

## 2024-04-25 ENCOUNTER — Emergency Department (HOSPITAL_BASED_OUTPATIENT_CLINIC_OR_DEPARTMENT_OTHER)
Admission: EM | Admit: 2024-04-25 | Discharge: 2024-04-26 | Disposition: A | Discharge status: Home / Self Care | Attending: Emergency Medicine | Admitting: Emergency Medicine

## 2024-04-25 DIAGNOSIS — R21 Rash and other nonspecific skin eruption: Secondary | ICD-10-CM | POA: Diagnosis present

## 2024-04-25 MED ORDER — KETOROLAC TROMETHAMINE 60 MG/2ML IM SOLN: 30.0000 mg | Freq: Once | INTRAMUSCULAR | Status: DC

## 2024-04-25 MED ORDER — DOXYCYCLINE HYCLATE 100 MG PO TABS
100.0000 mg | ORAL_TABLET | Freq: Once | ORAL | Status: AC
  Administered 2024-04-26: 100 mg via ORAL
  Filled 2024-04-25: qty 1

## 2024-04-25 MED ORDER — OXYCODONE-ACETAMINOPHEN 5-325 MG PO TABS
1.0000 | ORAL_TABLET | ORAL | Status: DC | PRN
  Administered 2024-04-25: 1 via ORAL
  Filled 2024-04-25: qty 1

## 2024-04-25 MED ORDER — KETOROLAC TROMETHAMINE 15 MG/ML IJ SOLN
15.0000 mg | Freq: Once | INTRAMUSCULAR | Status: AC
  Administered 2024-04-26: 15 mg via INTRAVENOUS
  Filled 2024-04-25: qty 1

## 2024-04-25 NOTE — Discharge Instructions (Signed)
 Your presentation is concerning for [facility] spotted fever which can be due to a tick bite.  Treatment is doxycycline  for 7 days.  Return to the emergency department for any severe worsening, uncontrolled fever, change in mental status, involvement of a rash that involves your genital region or oral mucosa

## 2024-04-25 NOTE — ED Triage Notes (Addendum)
 Patient reports bilateral feet swelling and rash with blisters. Rash and blisters to hands as well.

## 2024-04-25 NOTE — ED Provider Notes (Signed)
 " [place] EMERGENCY DEPARTMENT AT [facility] Provider Note   CSN: [id number] Arrival date & time: 04/25/24  1807     Patient presents with: Leg Swelling and Rash   Sheri Foster is a 28 y.o. female.  {Add pertinent medical, surgical, social history, OB history to HPI:32947}  Rash    28 year old female presenting to the emergency department with a spreading rash that is painful.  She states that she has blistering rash with lesions along her palms and soles which is spread up her arms and legs.  They are tender.  She denies any known tick exposure however she does have a dog that goes out in place in the yard.  She denies any fevers, endorses pain and swelling to the lower feet.  She is not currently sexually active.  Denies any involvement of the rash in the mucosal membranes of the vagina or mouth.  She denies any new medications prior to onset of rash.  She just started [facility] but the rash came on before this.  The rash has been present for the past few days.  Prior to Admission medications  Medication Sig Start Date End Date Taking? Authorizing Provider  cyclobenzaprine  (FLEXERIL ) 10 MG tablet Take 1 tablet (10 mg total) by mouth 2 (two) times daily as needed for muscle spasms. 06/23/23   Ajewole, Christana, MD  Ubrogepant  (UBRELVY ) 50 MG TABS Take 1 tablet (50 mg) at start of migraine.  May repeat dose in 2 hours if headache persists or recurs. 04/03/24   Mercer Clotilda SAUNDERS, MD    Allergies: Patient has no known allergies.    Review of Systems  Skin:  Positive for rash.  All other systems reviewed and are negative.   Updated Vital Signs BP (!) 130/98   Pulse 85   Temp 97.9 F (36.6 C) (Oral)   Resp 16   LMP 03/31/2024 (Exact Date)   SpO2 100%   Physical Exam Vitals and nursing note reviewed.  Constitutional:      General: She is not in acute distress.    Appearance: She is well-developed.  HENT:     Head: Normocephalic and atraumatic.     Mouth/Throat:      Comments: No intraoral mucosal lesions. Eyes:     Conjunctiva/sclera: Conjunctivae normal.  Cardiovascular:     Rate and Rhythm: Normal rate and regular rhythm.  Pulmonary:     Effort: Pulmonary effort is normal. No respiratory distress.     Breath sounds: Normal breath sounds.  Abdominal:     Palpations: Abdomen is soft.     Tenderness: There is no abdominal tenderness.  Musculoskeletal:        General: No swelling.     Cervical back: Neck supple.     Comments: Maculopapular rash that blanches and involves the palms and soles spreading up the legs.  Skin:    General: Skin is warm and dry.     Capillary Refill: Capillary refill takes less than 2 seconds.  Neurological:     Mental Status: She is alert.  Psychiatric:        Mood and Affect: Mood normal.     (all labs ordered are listed, but only abnormal results are displayed) Labs Reviewed - No data to display  EKG: None  Radiology: No results found.  {Document cardiac monitor, telemetry assessment procedure when appropriate:32947} Procedures   Medications Ordered in the ED  oxyCODONE -acetaminophen  (PERCOCET/ROXICET) 5-325 MG per tablet 1 tablet (1 tablet Oral Given 04/25/24  2016)      {Click here for ABCD2, HEART and other calculators REFRESH Note before signing:1}                              Medical Decision Making Amount and/or Complexity of Data Reviewed Labs: ordered.  Risk Prescription drug management.   ***  {Document critical care time when appropriate  Document review of labs and clinical decision tools ie CHADS2VASC2, etc  Document your independent review of radiology images and any outside records  Document your discussion with family members, caretakers and with consultants  Document social determinants of health affecting pt's care  Document your decision making why or why not admission, treatments were needed:32947:::1}   Final diagnoses:  None    ED Discharge Orders     None         "

## 2024-04-26 LAB — COMPREHENSIVE METABOLIC PANEL WITH GFR
ALT: 19 U/L (ref 0–44)
AST: 33 U/L (ref 15–41)
Albumin: 3.3 g/dL — ABNORMAL LOW (ref 3.5–5.0)
Alkaline Phosphatase: 39 U/L (ref 38–126)
Anion gap: 10 (ref 5–15)
BUN: 15 mg/dL (ref 6–20)
CO2: 23 mmol/L (ref 22–32)
Calcium: 8.9 mg/dL (ref 8.9–10.3)
Chloride: 101 mmol/L (ref 98–111)
Creatinine, Ser: 0.74 mg/dL (ref 0.44–1.00)
GFR, Estimated: >60 mL/min
Glucose, Bld: 85 mg/dL (ref 70–99)
Potassium: 4 mmol/L (ref 3.5–5.1)
Sodium: 134 mmol/L — ABNORMAL LOW (ref 135–145)
Total Bilirubin: 0.7 mg/dL (ref 0.0–1.2)
Total Protein: 8.9 g/dL — ABNORMAL HIGH (ref 6.5–8.1)

## 2024-04-26 LAB — CBC WITH DIFFERENTIAL/PLATELET
Abs Immature Granulocytes: 0.01 K/uL (ref 0.00–0.07)
Basophils Absolute: 0 K/uL (ref 0.0–0.1)
Basophils Relative: 0 %
Eosinophils Absolute: 0.1 K/uL (ref 0.0–0.5)
Eosinophils Relative: 3 %
HCT: 32.3 % — ABNORMAL LOW (ref 36.0–46.0)
Hemoglobin: 10.4 g/dL — ABNORMAL LOW (ref 12.0–15.0)
Immature Granulocytes: 0 %
Lymphocytes Relative: 34 %
Lymphs Abs: 1 K/uL (ref 0.7–4.0)
MCH: 26.5 pg (ref 26.0–34.0)
MCHC: 32.2 g/dL (ref 30.0–36.0)
MCV: 82.4 fL (ref 80.0–100.0)
Monocytes Absolute: 0.2 K/uL (ref 0.1–1.0)
Monocytes Relative: 5 %
Neutro Abs: 1.7 K/uL (ref 1.7–7.7)
Neutrophils Relative %: 58 %
Platelets: 320 K/uL (ref 150–400)
RBC: 3.92 MIL/uL (ref 3.87–5.11)
RDW: 13.3 % (ref 11.5–15.5)
Smear Review: NORMAL
WBC: 3.1 K/uL — ABNORMAL LOW (ref 4.0–10.5)
nRBC: 0 % (ref 0.0–0.2)

## 2024-04-26 LAB — RAPID HIV SCREEN (HIV 1/2 AB+AG)
HIV 1/2 Antibodies: NONREACTIVE
HIV-1 P24 Antigen - HIV24: NONREACTIVE

## 2024-04-26 LAB — SYPHILIS: RPR W/REFLEX TO RPR TITER AND TREPONEMAL ANTIBODIES, TRADITIONAL SCREENING AND DIAGNOSIS ALGORITHM: RPR Ser Ql: NONREACTIVE

## 2024-04-26 LAB — HCG, SERUM, QUALITATIVE: Preg, Serum: NEGATIVE

## 2024-04-26 MED ORDER — DOXYCYCLINE HYCLATE 100 MG PO CAPS: 100.0000 mg | ORAL_CAPSULE | Freq: Two times a day (BID) | ORAL | 0 refills | Status: AC

## 2024-05-03 ENCOUNTER — Encounter: Payer: Self-pay | Admitting: Family Medicine

## 2024-05-03 ENCOUNTER — Ambulatory Visit: Admitting: Family Medicine

## 2024-05-03 VITALS — BP 142/86 | HR 115 | Temp 98.7°F | Ht 64.0 in | Wt 193.4 lb

## 2024-05-03 DIAGNOSIS — R000 Tachycardia, unspecified: Secondary | ICD-10-CM

## 2024-05-03 DIAGNOSIS — H5712 Ocular pain, left eye: Secondary | ICD-10-CM

## 2024-05-03 DIAGNOSIS — R051 Acute cough: Secondary | ICD-10-CM | POA: Diagnosis not present

## 2024-05-03 DIAGNOSIS — M2550 Pain in unspecified joint: Secondary | ICD-10-CM

## 2024-05-03 DIAGNOSIS — R21 Rash and other nonspecific skin eruption: Secondary | ICD-10-CM | POA: Diagnosis not present

## 2024-05-03 DIAGNOSIS — R030 Elevated blood-pressure reading, without diagnosis of hypertension: Secondary | ICD-10-CM | POA: Diagnosis not present

## 2024-05-03 DIAGNOSIS — R4189 Other symptoms and signs involving cognitive functions and awareness: Secondary | ICD-10-CM | POA: Diagnosis not present

## 2024-05-03 DIAGNOSIS — R609 Edema, unspecified: Secondary | ICD-10-CM

## 2024-05-03 LAB — COMPREHENSIVE METABOLIC PANEL WITH GFR
ALT: 11 U/L (ref 3–35)
AST: 22 U/L (ref 5–37)
Albumin: 3.1 g/dL — ABNORMAL LOW (ref 3.5–5.2)
Alkaline Phosphatase: 32 U/L — ABNORMAL LOW (ref 39–117)
BUN: 12 mg/dL (ref 6–23)
CO2: 24 meq/L (ref 19–32)
Calcium: 8.8 mg/dL (ref 8.4–10.5)
Chloride: 104 meq/L (ref 96–112)
Creatinine, Ser: 0.88 mg/dL (ref 0.40–1.20)
GFR: 88 mL/min
Glucose, Bld: 83 mg/dL (ref 70–99)
Potassium: 3.6 meq/L (ref 3.5–5.1)
Sodium: 133 meq/L — ABNORMAL LOW (ref 135–145)
Total Bilirubin: 0.5 mg/dL (ref 0.2–1.2)
Total Protein: 8.7 g/dL — ABNORMAL HIGH (ref 6.0–8.3)

## 2024-05-03 LAB — CBC WITH DIFFERENTIAL/PLATELET
Basophils Absolute: 0 K/uL (ref 0.0–0.1)
Basophils Relative: 0.8 % (ref 0.0–3.0)
Eosinophils Absolute: 0.1 K/uL (ref 0.0–0.7)
Eosinophils Relative: 1.5 % (ref 0.0–5.0)
HCT: 32 % — ABNORMAL LOW (ref 36.0–46.0)
Hemoglobin: 10.4 g/dL — ABNORMAL LOW (ref 12.0–15.0)
Lymphocytes Relative: 23.6 % (ref 12.0–46.0)
Lymphs Abs: 1 K/uL (ref 0.7–4.0)
MCHC: 32.6 g/dL (ref 30.0–36.0)
MCV: 80 fl (ref 78.0–100.0)
Monocytes Absolute: 0.2 K/uL (ref 0.1–1.0)
Monocytes Relative: 4.5 % (ref 3.0–12.0)
Neutro Abs: 3 K/uL (ref 1.4–7.7)
Neutrophils Relative %: 69.6 % (ref 43.0–77.0)
Platelets: 319 K/uL (ref 150.0–400.0)
RBC: 4 Mil/uL (ref 3.87–5.11)
RDW: 13.9 % (ref 11.5–15.5)
WBC: 4.3 K/uL (ref 4.0–10.5)

## 2024-05-03 NOTE — Progress Notes (Signed)
 "  Established Patient Office Visit   Subjective  Patient ID: Sheri Foster, female    DOB: 01-02-1996  Age: 29 y.o. MRN: [id number]  Chief Complaint  Patient presents with   Acute Visit    Patient came in today for [facility] spotted fever, seen in the ED 12/30, patient was given Doxy, patient is having joint pain and selling, patient rates the pain 8 out of 10     Pt is a 29 yo female who presents with ongoing joint pain, rash, and systemic symptoms since mid Nov.  Seen in ED 04/25/24 for worsened rash, jt pain, pedal edema, and burning sensation in feet.  Mild hyponatremia and leukopenia noted.  EKG negative. Given Doxycycline , started 04/28/24.  Joint pain and stiffness, particularly in her elbows, ankles, knees, and hands. Started with difficulty moving R elbow, progressed to other joints. Pain accompanied by swelling, especially in her knuckles and knees, and her joints feel very warm.   Rash began mid-November, as painful, pruritic blisters in her mouth then appearing on her palms, soles, and inner thighs. Rash then became reddish-purple splotches that have since scarred. The rash has improved, with smaller, less painful spots remaining.  Pt also with headaches, eye pain L>R with slight redness.  Unsure if having blurred vision due to new glasses rx.  Noticing word finding and significant fatigue. Recalls episodes of night sweats and feeling unusually warm, particularly in early December, but denies any recent fever.  No h/o tick or insect bites.  Pt does have a dog that goes out.  No recent travel.  Typing for work is difficult due to symptoms.  Now with cough x a few days. No travel history, abdominal pain, or pain with urination. She reports nausea associated with taking doxycycline , which she has been on for 7-10 days, but notes no improvement in her symptoms since starting the medication.     Patient Active Problem List   Diagnosis Date Noted   Graves disease 05/31/2018    Hyperthyroidism 05/31/2018   Eczema 01/27/2018   Past Medical History:  Diagnosis Date   Depression    Migraines    Thyroid  disease 2019   History reviewed. No pertinent surgical history. Social History[1] Family History  Problem Relation Age of Onset   Asthma Mother    Hyperlipidemia Mother    Hypertension Mother    Hyperthyroidism Mother    Hypothyroidism Other    Allergies[2]  ROS Negative unless stated above    Objective:     BP (!) 142/84 (BP Location: Left Arm, Patient Position: Sitting, Cuff Size: Large)   Pulse (!) 115   Temp 98.7 F (37.1 C) (Oral)   Ht 5' 4 (1.626 m)   Wt 193 lb 6.4 oz (87.7 kg)   LMP 03/31/2024 (Exact Date)   SpO2 98%   BMI 33.20 kg/m  BP Readings from Last 3 Encounters:  05/03/24 (!) 142/84  04/25/24 (!) 130/98  04/03/24 124/80   Wt Readings from Last 3 Encounters:  05/03/24 193 lb 6.4 oz (87.7 kg)  03/15/24 198 lb 6.6 oz (90 kg)  03/02/24 200 lb (90.7 kg)      Physical Exam Constitutional:      Appearance: She is ill-appearing. She is not toxic-appearing.  HENT:     Head: Normocephalic and atraumatic.     Right Ear: Tympanic membrane normal.     Left Ear: Tympanic membrane normal.  Eyes:     General: Lids are normal.  Extraocular Movements:     Right eye: No nystagmus.     Left eye: No nystagmus.     Conjunctiva/sclera:     Left eye: Hemorrhage present.  Cardiovascular:     Rate and Rhythm: Regular rhythm. Tachycardia present.     Heart sounds: No murmur heard. Pulmonary:     Effort: Pulmonary effort is normal.     Breath sounds: No wheezing, rhonchi or rales.  Musculoskeletal:        General: Swelling and tenderness present.  Lymphadenopathy:     Cervical: Cervical adenopathy present.  Skin:    Findings: Rash present.     Comments: Blanching hyperpigmented, flat lesions of b/l feet  Neurological:     Mental Status: She is alert and oriented to person, place, and time.             05/03/2024     8:37 AM 04/03/2024   11:25 AM 03/02/2024    6:00 PM  Depression screen PHQ 2/9  Decreased Interest 0 0 0  Down, Depressed, Hopeless 0 0 0  PHQ - 2 Score 0 0 0  Altered sleeping 0 0 0  Tired, decreased energy 0 0 0  Change in appetite 0 0 0  Feeling bad or failure about yourself  0 0 0  Trouble concentrating 0 0 0  Moving slowly or fidgety/restless 0 0 0  Suicidal thoughts 0 0 0  PHQ-9 Score 0 0 0  Difficult doing work/chores Not difficult at all  Not difficult at all      05/03/2024    8:37 AM 04/03/2024   11:25 AM 03/02/2024    6:00 PM 10/07/2023    3:07 PM  GAD 7 : Generalized Anxiety Score  Nervous, Anxious, on Edge 0 0 0 0  Control/stop worrying 0 0 0 0  Worry too much - different things 0 0 0 0  Trouble relaxing 0 0 0 0  Restless 0 0 0 0  Easily annoyed or irritable 0 0 0 0  Afraid - awful might happen 0 0 0 0  Total GAD 7 Score 0 0 0 0  Anxiety Difficulty Not difficult at all  Not difficult at all      No results found for any visits on 05/03/24.    Assessment & Plan:   Elevated blood pressure reading without diagnosis of hypertension -     Rocky mtn spotted fvr abs pnl(IgG+IgM) -     Lyme Disease Serology w/Reflex -     Comprehensive metabolic panel with GFR -     Ambulatory referral to Infectious Disease  Tachycardia -     Rocky mtn spotted fvr abs pnl(IgG+IgM) -     Lyme Disease Serology w/Reflex -     Comprehensive metabolic panel with GFR -     CBC with Differential/Platelet; Future -     Ambulatory referral to Infectious Disease -     DG Chest 2 View; Future  Multiple joint pain -     Rocky mtn spotted fvr abs pnl(IgG+IgM) -     Lyme Disease Serology w/Reflex -     CBC with Differential/Platelet; Future -     Ambulatory referral to Infectious Disease -     DG Chest 2 View; Future  Edema, unspecified type -     Rocky mtn spotted fvr abs pnl(IgG+IgM) -     Lyme Disease Serology w/Reflex -     Comprehensive metabolic panel with GFR -     Ambulatory  referral to Infectious Disease  Rash -     Rocky mtn spotted fvr abs pnl(IgG+IgM) -     Lyme Disease Serology w/Reflex -     CBC with Differential/Platelet; Future -     Ambulatory referral to Infectious Disease  Acute cough -     Rocky mtn spotted fvr abs pnl(IgG+IgM) -     CBC with Differential/Platelet; Future -     DG Chest 2 View; Future  Left eye pain -     Rocky mtn spotted fvr abs pnl(IgG+IgM) -     CBC with Differential/Platelet; Future -     Ambulatory referral to Infectious Disease  Brain fog -     Rocky mtn spotted fvr abs pnl(IgG+IgM) -     Lyme Disease Serology w/Reflex -     Ambulatory referral to Infectious Disease  Chronic systemic symptoms including jt pain, edema, rash on palms and soles of feet, fatigue, brain fog, eye pain, bp elevation, tachycardia, HAs despite starting doxycycline .  Mild hyponatremia 134, leukopenia WBC 3.1, anemia with H/H 10.4/32.3, plts nml 320 in ED 04/25/24.  HIV, RPR testing negative.  Given rash consider coxsackie virus though resolution would have been expected, rickettsia- RMSF ordered in ED but appears was cancelled.  RPR negative.  Also consider mono, or blood dyscrasia as per further chart review CBC from ED with ovalocytes and smudge cells.  Will await repeat labs.  Referral to Heme/Onc if needed.  H/o Graves' dz, not currently on meds.  TSH (2.06) and Free T4 (0.71) normal on 04/03/24.  Referral to ID placed.  Given strict precautions.  Return in about 1 week (around 05/10/2024), or if symptoms worsen or fail to improve.   I personally spent a total of 51 minutes in the care of the patient today including preparing to see the patient, getting/reviewing separately obtained history, performing a medically appropriate exam/evaluation, counseling and educating, placing orders, referring and communicating with other health care professionals, documenting clinical information in the EHR, independently interpreting results, communicating  results, and coordinating care.  Clotilda JONELLE Single, MD      [1]  Social History Tobacco Use   Smoking status: Never   Smokeless tobacco: Never  Substance Use Topics   Alcohol use: Yes   Drug use: Never  [2] No Known Allergies  "

## 2024-05-03 NOTE — Progress Notes (Incomplete)
 "  Established Patient Office Visit   Subjective  Patient ID: Sheri Foster, female    DOB: 1995-07-24  Age: 29 y.o. MRN: [id number]  Chief Complaint  Patient presents with   Acute Visit    Patient came in today for [facility] spotted fever, seen in the ED 12/30, patient was given Doxy, patient is having joint pain and selling, patient rates the pain 8 out of 10     Pt is a 29 yo female who presents with ongoing joint pain, rash, and systemic symptoms since mid Nov.  Seen in ED 04/25/24 for worsened rash, jt pain, pedal edema, and burning sensation in feet.  Mild hyponatremia and leukopenia noted.  EKG negative. Given Doxycycline , started 04/28/24.  Joint pain and stiffness, particularly in her elbows, ankles, knees, and hands. Started with difficulty moving R elbow, progressed to other joints. Pain accompanied by swelling, especially in her knuckles and knees, and her joints feel very warm.   Rash began mid-November, as painful, pruritic blisters in her mouth then appearing on her palms, soles, and inner thighs. Rash then became reddish-purple splotches that have since scarred. The rash has improved, with smaller, less painful spots remaining.  Pt also with headaches, eye pain L>R with slight redness.  Unsure if having blurred vision due to new glasses rx.  Noticing word finding and significant fatigue. Recalls episodes of night sweats and feeling unusually warm, particularly in early December, but denies any recent fever.  No h/o tick or insect bites.  Pt does have a dog that goes out.  No recent travel.  Typing for work is difficult due to symptoms.  Now with cough x a few days. No travel history, abdominal pain, or pain with urination. She reports nausea associated with taking doxycycline , which she has been on for 7-10 days, but notes no improvement in her symptoms since starting the medication.     Patient Active Problem List   Diagnosis Date Noted   Graves disease 05/31/2018    Hyperthyroidism 05/31/2018   Eczema 01/27/2018   Past Medical History:  Diagnosis Date   Depression    Migraines    Thyroid  disease 2019   History reviewed. No pertinent surgical history. Social History[1] Family History  Problem Relation Age of Onset   Asthma Mother    Hyperlipidemia Mother    Hypertension Mother    Hyperthyroidism Mother    Hypothyroidism Other    Allergies[2]  ROS Negative unless stated above    Objective:     BP (!) 142/84 (BP Location: Left Arm, Patient Position: Sitting, Cuff Size: Large)   Pulse (!) 115   Temp 98.7 F (37.1 C) (Oral)   Ht 5' 4 (1.626 m)   Wt 193 lb 6.4 oz (87.7 kg)   LMP 03/31/2024 (Exact Date)   SpO2 98%   BMI 33.20 kg/m  BP Readings from Last 3 Encounters:  05/03/24 (!) 142/84  04/25/24 (!) 130/98  04/03/24 124/80   Wt Readings from Last 3 Encounters:  05/03/24 193 lb 6.4 oz (87.7 kg)  03/15/24 198 lb 6.6 oz (90 kg)  03/02/24 200 lb (90.7 kg)      Physical Exam Constitutional:      Appearance: She is ill-appearing. She is not toxic-appearing.  HENT:     Head: Normocephalic and atraumatic.     Right Ear: Tympanic membrane normal.     Left Ear: Tympanic membrane normal.  Eyes:     General: Lids are normal.  Extraocular Movements:     Right eye: No nystagmus.     Left eye: No nystagmus.     Conjunctiva/sclera:     Left eye: Hemorrhage present.  Cardiovascular:     Rate and Rhythm: Regular rhythm. Tachycardia present.     Heart sounds: No murmur heard. Pulmonary:     Effort: Pulmonary effort is normal.     Breath sounds: No wheezing, rhonchi or rales.  Musculoskeletal:        General: Swelling and tenderness present.  Lymphadenopathy:     Cervical: Cervical adenopathy present.  Skin:    Findings: Rash present.     Comments: Blanching hyperpigmented, flat lesions of b/l feet  Neurological:     Mental Status: She is alert and oriented to person, place, and time.              05/03/2024    8:37 AM 04/03/2024   11:25 AM 03/02/2024    6:00 PM  Depression screen PHQ 2/9  Decreased Interest 0 0 0  Down, Depressed, Hopeless 0 0 0  PHQ - 2 Score 0 0 0  Altered sleeping 0 0 0  Tired, decreased energy 0 0 0  Change in appetite 0 0 0  Feeling bad or failure about yourself  0 0 0  Trouble concentrating 0 0 0  Moving slowly or fidgety/restless 0 0 0  Suicidal thoughts 0 0 0  PHQ-9 Score 0 0 0  Difficult doing work/chores Not difficult at all  Not difficult at all      05/03/2024    8:37 AM 04/03/2024   11:25 AM 03/02/2024    6:00 PM 10/07/2023    3:07 PM  GAD 7 : Generalized Anxiety Score  Nervous, Anxious, on Edge 0 0 0 0  Control/stop worrying 0 0 0 0  Worry too much - different things 0 0 0 0  Trouble relaxing 0 0 0 0  Restless 0 0 0 0  Easily annoyed or irritable 0 0 0 0  Afraid - awful might happen 0 0 0 0  Total GAD 7 Score 0 0 0 0  Anxiety Difficulty Not difficult at all  Not difficult at all      No results found for any visits on 05/03/24.    Assessment & Plan:   Elevated blood pressure reading without diagnosis of hypertension -     Rocky mtn spotted fvr abs pnl(IgG+IgM) -     Lyme Disease Serology w/Reflex -     Comprehensive metabolic panel with GFR -     Ambulatory referral to Infectious Disease  Tachycardia -     Rocky mtn spotted fvr abs pnl(IgG+IgM) -     Lyme Disease Serology w/Reflex -     Comprehensive metabolic panel with GFR -     CBC with Differential/Platelet; Future -     Ambulatory referral to Infectious Disease -     DG Chest 2 View; Future  Multiple joint pain -     Rocky mtn spotted fvr abs pnl(IgG+IgM) -     Lyme Disease Serology w/Reflex -     CBC with Differential/Platelet; Future -     Ambulatory referral to Infectious Disease -     DG Chest 2 View; Future  Edema, unspecified type -     Rocky mtn spotted fvr abs pnl(IgG+IgM) -     Lyme Disease Serology w/Reflex -     Comprehensive metabolic panel with GFR -  Ambulatory referral to Infectious Disease  Rash -     Rocky mtn spotted fvr abs pnl(IgG+IgM) -     Lyme Disease Serology w/Reflex -     CBC with Differential/Platelet; Future -     Ambulatory referral to Infectious Disease  Acute cough -     Rocky mtn spotted fvr abs pnl(IgG+IgM) -     CBC with Differential/Platelet; Future -     DG Chest 2 View; Future  Left eye pain -     Rocky mtn spotted fvr abs pnl(IgG+IgM) -     CBC with Differential/Platelet; Future -     Ambulatory referral to Infectious Disease  Brain fog -     Rocky mtn spotted fvr abs pnl(IgG+IgM) -     Lyme Disease Serology w/Reflex -     Ambulatory referral to Infectious Disease  Chronic systemic symptoms despite doxycycline .  Mild hyponatremia 134, Seen in ED 04/25/24.  On Doxy with no improvement.    No follow-ups on file.   Clotilda JONELLE Single, MD        [1] Social History Tobacco Use   Smoking status: Never   Smokeless tobacco: Never  Substance Use Topics   Alcohol use: Yes   Drug use: Never  [2] No Known Allergies "

## 2024-05-04 ENCOUNTER — Telehealth: Payer: Self-pay | Admitting: Family Medicine

## 2024-05-04 DIAGNOSIS — Z0279 Encounter for issue of other medical certificate: Secondary | ICD-10-CM

## 2024-05-04 LAB — LYME DISEASE SEROLOGY W/REFLEX: Lyme Total Antibody EIA: NEGATIVE

## 2024-05-04 NOTE — Telephone Encounter (Signed)
 Called patient, patient has numb to call [facility] to get sch

## 2024-05-04 NOTE — Telephone Encounter (Signed)
 Copied from CRM [id number]. Topic: Referral - Status >> May 04, 2024  2:27 PM Jayma L wrote: Reason for CRM: calling for a urgent referral check , called [facility] and was advised to send a message

## 2024-05-05 LAB — ROCKY MTN SPOTTED FVR ABS PNL(IGG+IGM)
RMSF IgG: NOT DETECTED
RMSF IgM: NOT DETECTED

## 2024-05-05 NOTE — Telephone Encounter (Signed)
 FMLA paperwork has been filled out and faxed, patient is aware

## 2024-05-07 ENCOUNTER — Ambulatory Visit: Payer: Self-pay | Admitting: Family Medicine

## 2024-05-07 DIAGNOSIS — R778 Other specified abnormalities of plasma proteins: Secondary | ICD-10-CM

## 2024-05-07 DIAGNOSIS — D649 Anemia, unspecified: Secondary | ICD-10-CM

## 2024-05-08 ENCOUNTER — Other Ambulatory Visit: Payer: Self-pay | Admitting: Family Medicine

## 2024-05-08 DIAGNOSIS — R21 Rash and other nonspecific skin eruption: Secondary | ICD-10-CM

## 2024-05-08 DIAGNOSIS — R5383 Other fatigue: Secondary | ICD-10-CM

## 2024-05-08 DIAGNOSIS — R509 Fever, unspecified: Secondary | ICD-10-CM

## 2024-05-08 DIAGNOSIS — M2550 Pain in unspecified joint: Secondary | ICD-10-CM

## 2024-05-09 ENCOUNTER — Ambulatory Visit: Payer: Self-pay

## 2024-05-09 ENCOUNTER — Telehealth: Payer: Self-pay | Admitting: Family Medicine

## 2024-05-09 ENCOUNTER — Encounter: Payer: Self-pay | Admitting: *Deleted

## 2024-05-09 ENCOUNTER — Other Ambulatory Visit

## 2024-05-09 NOTE — Telephone Encounter (Signed)
 Pt refused same day visit. ED advised with any worsening of symptoms, T over 103 or below 96, or if new symptoms develop. Pt verbalized understanding. Next day OV scheduled.  FYI Only or Action Required?: Action required by provider: update on patient condition.  Patient was last seen in primary care on 05/03/2024 by Mercer Clotilda SAUNDERS, MD.  Called Nurse Triage reporting Fatigue.  Symptoms began a week ago.  Interventions attempted: OTC medications: tylenol .  Symptoms are: varied.  Triage Disposition: See HCP Within 4 Hours (Or PCP Triage)  Patient/caregiver understands and will follow disposition?: No, refuses disposition Reason for Disposition  [1] MODERATE weakness or fatigue AND [2] from poor fluid intake AND [3] no improvement after 2 hours of rest and fluids  Answer Assessment - Initial Assessment Questions Patients sister called, this RN requested to speak with pt d/t sister not being on DPR. Pt reports weakness x 1 week. Pt reports being tx with antibiotics for suspected Lyme and Rocky Mtn Spotted Fever on 04/25/24, PCP office tested her for same and was negative per patient.   Pt has lab appt today but states unable to come in d/t feeling tired and weak. States felt that her temp has been low and temp was 97.53F, then woke up hot and her temp was 1053F 3 nights ago. States last night, she did not experience this. Temp is currently 99.57F. Has not taken any OTC med for fever. Advised tylenol  as directed.  1. DESCRIPTION: Describe how you are feeling.     Weakness, hard to focus, tachycardia when standing for 20 seconds 2. SEVERITY: How bad is it?  Can you stand and walk?     Severe, unable to get out of bed 3. ONSET: When did these symptoms begin? (e.g., hours, days, weeks, months)     1 week ago 4. CAUSE: What do you think is causing the weakness or fatigue? (e.g., not drinking enough fluids, medical problem, trouble sleeping)     Unknown 6. OTHER SYMPTOMS: Do you have  any other symptoms? (e.g., chest pain, fever, cough, SOB, vomiting, diarrhea, bleeding, other areas of pain)     Denies, one episode of diarrhea  Protocols used: Weakness (Generalized) and Fatigue-A-AH Copied from CRM #[id number]. Topic: Clinical - Red Word Triage >> May 09, 2024  9:26 AM Suzen RAMAN wrote: Red Word that prompted transfer to Nurse Triage: patient sister on the line stating patient is currently experience weakness(believes its b/c she has been bedridden for the last couple weeks) and unable to regulate her temp and gets very hot and cant cool down w/o cooling pad Past Medical History:  Diagnosis Date   Depression    Migraines    Thyroid  disease 2019

## 2024-05-09 NOTE — Telephone Encounter (Signed)
 Patient sister calling back for assist to get patient to ED. Patient denies chest pain no difficulty breathing per patient sister. Patient up stairs and unable to get in to car due to extreme fatigue and high fever. Patient is awake per sister. This NT called 911 to request EMS transport patient to ED for evaluation.

## 2024-05-09 NOTE — Telephone Encounter (Signed)
 Spoke with patient Per Dr. Mercer patient needs to get Chest X-ray, patient states she is unable to stand and is feeling worse, Advised going to the ED, patient is willing to go, will keep appt for tom and will cancel if not needed

## 2024-05-09 NOTE — Telephone Encounter (Signed)
 Copied from CRM #[id number]. Topic: General - Call Back - No Documentation >> May 09, 2024 10:17 AM Robinson H wrote: Reason for CRM: Patient returning call to [facility] at office, no notes or messages at time patient called. Advised by CAL to inform patient she needs to have an Xray done as well at visit. Patient states she wasn't sure if she was going to make it in for appointment tomorrow states she's not sure if her body will allow her

## 2024-05-09 NOTE — Telephone Encounter (Signed)
Appt on 11/4

## 2024-05-09 NOTE — Telephone Encounter (Signed)
 Patient sister calling back to assist with call EMS for transportation to ED. Sister reports patient denies chest pain no difficulty breathing. Extreme fatigue and unable to get out of bed to get downstairs in car to go to ED.  Fever remains elevated per patient sister. This NT called 911 for EMS to transport patient to ED.   See previous encounter today    Copied from CRM [id number]. Topic: Clinical - Red Word Triage >> May 09, 2024 10:57 AM Sheri Foster wrote: [facility] that prompted transfer to Nurse Triage: Pt sister, Sheri Foster, calling back on behalf of pt, due weakness, and unable to regulate fevers.  Reports previously spoke to NT, and was advised for assistance for ER transport via EMS nonemergency due to symptoms.   Calling back to arrange pick up of pt. This encounter was created in error - please disregard.

## 2024-05-10 ENCOUNTER — Emergency Department (HOSPITAL_COMMUNITY)

## 2024-05-10 ENCOUNTER — Encounter: Payer: Self-pay | Admitting: Family Medicine

## 2024-05-10 ENCOUNTER — Inpatient Hospital Stay (HOSPITAL_COMMUNITY)
Admission: EM | Admit: 2024-05-10 | Discharge: 2024-05-15 | DRG: 853 | Disposition: A | Discharge status: Home / Self Care | Attending: Internal Medicine | Admitting: Internal Medicine

## 2024-05-10 ENCOUNTER — Ambulatory Visit (INDEPENDENT_AMBULATORY_CARE_PROVIDER_SITE_OTHER): Admitting: Family Medicine

## 2024-05-10 ENCOUNTER — Other Ambulatory Visit: Payer: Self-pay

## 2024-05-10 ENCOUNTER — Encounter (HOSPITAL_COMMUNITY): Payer: Self-pay | Admitting: Pharmacy Technician

## 2024-05-10 ENCOUNTER — Other Ambulatory Visit

## 2024-05-10 ENCOUNTER — Ambulatory Visit (INDEPENDENT_AMBULATORY_CARE_PROVIDER_SITE_OTHER)

## 2024-05-10 VITALS — BP 118/78 | HR 138 | Temp 102.7°F | Resp 16 | Ht 64.0 in | Wt 190.0 lb

## 2024-05-10 DIAGNOSIS — A419 Sepsis, unspecified organism: Principal | ICD-10-CM | POA: Diagnosis present

## 2024-05-10 DIAGNOSIS — E66811 Obesity, class 1: Secondary | ICD-10-CM | POA: Diagnosis present

## 2024-05-10 DIAGNOSIS — M2550 Pain in unspecified joint: Secondary | ICD-10-CM

## 2024-05-10 DIAGNOSIS — R55 Syncope and collapse: Secondary | ICD-10-CM | POA: Diagnosis not present

## 2024-05-10 DIAGNOSIS — R5383 Other fatigue: Secondary | ICD-10-CM | POA: Diagnosis not present

## 2024-05-10 DIAGNOSIS — R000 Tachycardia, unspecified: Secondary | ICD-10-CM

## 2024-05-10 DIAGNOSIS — D649 Anemia, unspecified: Secondary | ICD-10-CM

## 2024-05-10 DIAGNOSIS — R051 Acute cough: Secondary | ICD-10-CM

## 2024-05-10 DIAGNOSIS — E871 Hypo-osmolality and hyponatremia: Secondary | ICD-10-CM | POA: Diagnosis not present

## 2024-05-10 DIAGNOSIS — R531 Weakness: Secondary | ICD-10-CM

## 2024-05-10 DIAGNOSIS — R778 Other specified abnormalities of plasma proteins: Secondary | ICD-10-CM | POA: Diagnosis not present

## 2024-05-10 DIAGNOSIS — R6520 Severe sepsis without septic shock: Secondary | ICD-10-CM | POA: Diagnosis present

## 2024-05-10 DIAGNOSIS — Z8249 Family history of ischemic heart disease and other diseases of the circulatory system: Secondary | ICD-10-CM

## 2024-05-10 DIAGNOSIS — Z1152 Encounter for screening for COVID-19: Secondary | ICD-10-CM

## 2024-05-10 DIAGNOSIS — R509 Fever, unspecified: Secondary | ICD-10-CM

## 2024-05-10 DIAGNOSIS — R221 Localized swelling, mass and lump, neck: Secondary | ICD-10-CM | POA: Diagnosis not present

## 2024-05-10 DIAGNOSIS — E0590 Thyrotoxicosis, unspecified without thyrotoxic crisis or storm: Secondary | ICD-10-CM | POA: Diagnosis present

## 2024-05-10 DIAGNOSIS — Z825 Family history of asthma and other chronic lower respiratory diseases: Secondary | ICD-10-CM

## 2024-05-10 DIAGNOSIS — N179 Acute kidney failure, unspecified: Secondary | ICD-10-CM | POA: Diagnosis not present

## 2024-05-10 DIAGNOSIS — R21 Rash and other nonspecific skin eruption: Secondary | ICD-10-CM

## 2024-05-10 DIAGNOSIS — R569 Unspecified convulsions: Secondary | ICD-10-CM | POA: Diagnosis not present

## 2024-05-10 DIAGNOSIS — I3139 Other pericardial effusion (noninflammatory): Secondary | ICD-10-CM | POA: Diagnosis present

## 2024-05-10 DIAGNOSIS — E860 Dehydration: Secondary | ICD-10-CM | POA: Diagnosis present

## 2024-05-10 DIAGNOSIS — Z6832 Body mass index (BMI) 32.0-32.9, adult: Secondary | ICD-10-CM

## 2024-05-10 DIAGNOSIS — M329 Systemic lupus erythematosus, unspecified: Secondary | ICD-10-CM | POA: Diagnosis present

## 2024-05-10 DIAGNOSIS — R591 Generalized enlarged lymph nodes: Secondary | ICD-10-CM | POA: Diagnosis present

## 2024-05-10 DIAGNOSIS — Z83438 Family history of other disorder of lipoprotein metabolism and other lipidemia: Secondary | ICD-10-CM

## 2024-05-10 DIAGNOSIS — Z8349 Family history of other endocrine, nutritional and metabolic diseases: Secondary | ICD-10-CM

## 2024-05-10 DIAGNOSIS — E279 Disorder of adrenal gland, unspecified: Secondary | ICD-10-CM | POA: Diagnosis present

## 2024-05-10 DIAGNOSIS — J189 Pneumonia, unspecified organism: Secondary | ICD-10-CM | POA: Diagnosis not present

## 2024-05-10 DIAGNOSIS — D509 Iron deficiency anemia, unspecified: Secondary | ICD-10-CM | POA: Diagnosis present

## 2024-05-10 DIAGNOSIS — G43909 Migraine, unspecified, not intractable, without status migrainosus: Secondary | ICD-10-CM | POA: Diagnosis present

## 2024-05-10 DIAGNOSIS — Z79899 Other long term (current) drug therapy: Secondary | ICD-10-CM

## 2024-05-10 LAB — COMPREHENSIVE METABOLIC PANEL WITH GFR
ALT: 13 U/L (ref 0–44)
AST: 37 U/L (ref 15–41)
Albumin: 2.7 g/dL — ABNORMAL LOW (ref 3.5–5.0)
Alkaline Phosphatase: 21 U/L — ABNORMAL LOW (ref 38–126)
Anion gap: 8 (ref 5–15)
BUN: 21 mg/dL — ABNORMAL HIGH (ref 6–20)
CO2: 21 mmol/L — ABNORMAL LOW (ref 22–32)
Calcium: 7.9 mg/dL — ABNORMAL LOW (ref 8.9–10.3)
Chloride: 100 mmol/L (ref 98–111)
Creatinine, Ser: 1.48 mg/dL — ABNORMAL HIGH (ref 0.44–1.00)
GFR, Estimated: 48 mL/min — ABNORMAL LOW
Glucose, Bld: 125 mg/dL — ABNORMAL HIGH (ref 70–99)
Potassium: 4.4 mmol/L (ref 3.5–5.1)
Sodium: 130 mmol/L — ABNORMAL LOW (ref 135–145)
Total Bilirubin: 0.6 mg/dL (ref 0.0–1.2)
Total Protein: 7.9 g/dL (ref 6.5–8.1)

## 2024-05-10 LAB — CBC WITH DIFFERENTIAL/PLATELET
Abs Immature Granulocytes: 0.04 K/uL (ref 0.00–0.07)
Basophils Absolute: 0 K/uL (ref 0.0–0.1)
Basophils Relative: 0 %
Eosinophils Absolute: 0 K/uL (ref 0.0–0.5)
Eosinophils Relative: 0 %
HCT: 29.3 % — ABNORMAL LOW (ref 36.0–46.0)
Hemoglobin: 9.3 g/dL — ABNORMAL LOW (ref 12.0–15.0)
Immature Granulocytes: 1 %
Lymphocytes Relative: 26 %
Lymphs Abs: 1.6 K/uL (ref 0.7–4.0)
MCH: 26.1 pg (ref 26.0–34.0)
MCHC: 31.7 g/dL (ref 30.0–36.0)
MCV: 82.1 fL (ref 80.0–100.0)
Monocytes Absolute: 0.3 K/uL (ref 0.1–1.0)
Monocytes Relative: 5 %
Neutro Abs: 4.4 K/uL (ref 1.7–7.7)
Neutrophils Relative %: 68 %
Platelets: 270 K/uL (ref 150–400)
RBC: 3.57 MIL/uL — ABNORMAL LOW (ref 3.87–5.11)
RDW: 14.4 % (ref 11.5–15.5)
WBC: 6.3 K/uL (ref 4.0–10.5)
nRBC: 0 % (ref 0.0–0.2)

## 2024-05-10 LAB — MAGNESIUM: Magnesium: 2.2 mg/dL (ref 1.7–2.4)

## 2024-05-10 LAB — RESP PANEL BY RT-PCR (RSV, FLU A&B, COVID)  RVPGX2
Influenza A by PCR: NEGATIVE
Influenza B by PCR: NEGATIVE
Resp Syncytial Virus by PCR: NEGATIVE
SARS Coronavirus 2 by RT PCR: NEGATIVE

## 2024-05-10 LAB — TSH: TSH: 2.43 u[IU]/mL (ref 0.350–4.500)

## 2024-05-10 LAB — GLUCOSE, POCT (MANUAL RESULT ENTRY): POC Glucose: 113 mg/dL — AB (ref 70–99)

## 2024-05-10 LAB — HCG, SERUM, QUALITATIVE: Preg, Serum: NEGATIVE

## 2024-05-10 MED ORDER — SODIUM CHLORIDE 0.9 % IV SOLN
2.0000 g | Freq: Two times a day (BID) | INTRAVENOUS | Status: DC
  Administered 2024-05-11: 2 g via INTRAVENOUS
  Filled 2024-05-10: qty 12.5

## 2024-05-10 MED ORDER — METRONIDAZOLE 500 MG/100ML IV SOLN
500.0000 mg | Freq: Two times a day (BID) | INTRAVENOUS | Status: DC
  Administered 2024-05-10 – 2024-05-13 (×6): 500 mg via INTRAVENOUS
  Filled 2024-05-10 (×6): qty 100

## 2024-05-10 MED ORDER — IOHEXOL 350 MG/ML SOLN
80.0000 mL | Freq: Once | INTRAVENOUS | Status: AC | PRN
  Administered 2024-05-10: 80 mL via INTRAVENOUS

## 2024-05-10 MED ORDER — VANCOMYCIN HCL IN DEXTROSE 1-5 GM/200ML-% IV SOLN: 1000.0000 mg | Freq: Once | INTRAVENOUS | Status: DC

## 2024-05-10 MED ORDER — SODIUM CHLORIDE 0.9 % IV BOLUS
1000.0000 mL | Freq: Once | INTRAVENOUS | Status: AC
  Administered 2024-05-10: 1000 mL via INTRAVENOUS

## 2024-05-10 MED ORDER — VANCOMYCIN HCL 1750 MG/350ML IV SOLN
1750.0000 mg | Freq: Once | INTRAVENOUS | Status: AC
  Administered 2024-05-10: 1750 mg via INTRAVENOUS
  Filled 2024-05-10: qty 350

## 2024-05-10 MED ORDER — KETOROLAC TROMETHAMINE 15 MG/ML IJ SOLN
15.0000 mg | Freq: Once | INTRAMUSCULAR | Status: AC
  Administered 2024-05-10: 15 mg via INTRAVENOUS
  Filled 2024-05-10: qty 1

## 2024-05-10 MED ORDER — VANCOMYCIN HCL 1500 MG/300ML IV SOLN: 1500.0000 mg | INTRAVENOUS | Status: DC

## 2024-05-10 MED ORDER — SODIUM CHLORIDE 0.9 % IV SOLN
2.0000 g | Freq: Once | INTRAVENOUS | Status: AC
  Administered 2024-05-10: 2 g via INTRAVENOUS
  Filled 2024-05-10: qty 12.5

## 2024-05-10 MED ORDER — SODIUM CHLORIDE 0.9 % IV SOLN
150.0000 mL/h | INTRAVENOUS | Status: AC
  Administered 2024-05-10: 150 mL/h via INTRAVENOUS

## 2024-05-10 MED ORDER — SODIUM CHLORIDE 0.9 % IV BOLUS
500.0000 mL | Freq: Once | INTRAVENOUS | Status: AC
  Administered 2024-05-10: 500 mL via INTRAVENOUS

## 2024-05-10 NOTE — Assessment & Plan Note (Signed)
 Check TSH

## 2024-05-10 NOTE — Subjective & Objective (Signed)
 Patient has been sick for the past 4 to 5 days for fevers up to 101 lymph nodes in her neck which is swollen sore throat, pain worse on the right side of her cheek. She also have had some bodyaches blisters on her hands and feet that has been ongoing for the past few months has been evaluated supposedly for [facility] spotted fever and Lyme disease and workup according to patient has been negative Endorses weight loss No personal history of autoimmune disorders no family history of lupus or RA.  She does have history of eczema But her primary care notes CBC done on 7 January showed smudge cells  Patient went to her primary care provider today and had 2 episodes of seizure-like activity in the office lasting between 15 to 30 seconds with LOC no incontinence no postictal state there was some lightheadedness associated with them.  This occurred when the provider was trying to palpate her inflamed lymph node.  Patient states that she felt very tired and wanted to sleep.  When she woke up there was people around her and she felt like she wanted to go back to sleep no associated chest pain  Noted to be hypotensive blood pressure 90/68 heart rate 130 glucose 113  Felt to be likely syncopal episodes  Patient have had decreased p.o. intake and differences in her diet lately but she has been trying to rehydrate herself by increasing her fluid intake

## 2024-05-10 NOTE — ED Provider Notes (Signed)
 "  EMERGENCY DEPARTMENT AT [facility] Provider Note   CSN: [id number] Arrival date & time: 05/10/24  1801     Patient presents with: Loss of Consciousness   Sheri Foster is a 29 y.o. female.   29 yo F with a chief complaints of multiple syncopal events.  This happened when she went to see her doctor in clinic today.  Said that he was palpating a inflamed lymph node along the right side of her neck and she lost consciousness.  She tells me that she just felt very tired and felt she wanted to go to sleep.  Said she woke up and people were around her asking her questions she did not know the answer to and so she was started went back to sleep.  Something this occurred a couple times before she was brought here.  She denied any chest pain denied cough fever denied headaches and neck pain.  She tells me that she has not been doing well since November.  Has been having multiple different things happen to her.  Was told that she maybe has [facility] spotted fever and was started on doxycycline .  She does not feel like she has got better.  She is only able to get out of a chair and walk 20 feet at max.  She says she had to do a lot of standing at the doctor's office today and she thinks maybe that is why she passed out as that she was overexerted.  Has not really felt like eating for weeks.  Has not been eating anything has been cooked.  She has been trying to drink as much as she can.   Loss of Consciousness      Prior to Admission medications  Medication Sig Start Date End Date Taking? Authorizing Provider  cyclobenzaprine  (FLEXERIL ) 10 MG tablet Take 1 tablet (10 mg total) by mouth 2 (two) times daily as needed for muscle spasms. 06/23/23   Ajewole, Christana, MD  Ubrogepant  (UBRELVY ) 50 MG TABS Take 1 tablet (50 mg) at start of migraine.  May repeat dose in 2 hours if headache persists or recurs. 04/03/24   Mercer Clotilda SAUNDERS, MD    Allergies: Patient has no known  allergies.    Review of Systems  Cardiovascular:  Positive for syncope.    Updated Vital Signs BP 95/68   Pulse 100   Temp 98.2 F (36.8 C) (Oral)   Resp (!) 22   LMP 05/10/2024 (Approximate)   SpO2 98%   Physical Exam Vitals and nursing note reviewed.  Constitutional:      General: She is not in acute distress.    Appearance: She is well-developed. She is not diaphoretic.  HENT:     Head: Normocephalic and atraumatic.     Mouth/Throat:     Comments: Palpable painful area along the right angle of the jaw.  No obvious erythema or fluctuance.  TMs are normal bilaterally.  No obvious sublingual swelling.  Able to rotate her head without obvious discomfort.  Uvula is midline. Eyes:     Pupils: Pupils are equal, round, and reactive to light.  Cardiovascular:     Rate and Rhythm: Normal rate and regular rhythm.     Heart sounds: No murmur heard.    No friction rub. No gallop.  Pulmonary:     Effort: Pulmonary effort is normal.     Breath sounds: No wheezing or rales.  Abdominal:     General: There is  no distension.     Palpations: Abdomen is soft.     Tenderness: There is no abdominal tenderness.  Musculoskeletal:        General: No tenderness.     Cervical back: Normal range of motion and neck supple.  Skin:    General: Skin is warm and dry.  Neurological:     Mental Status: She is alert and oriented to person, place, and time.  Psychiatric:        Mood and Affect: Affect is flat.        Behavior: Behavior normal.     (all labs ordered are listed, but only abnormal results are displayed) Labs Reviewed  CBC WITH DIFFERENTIAL/PLATELET - Abnormal; Notable for the following components:      Result Value   RBC 3.57 (*)    Hemoglobin 9.3 (*)    HCT 29.3 (*)    All other components within normal limits  COMPREHENSIVE METABOLIC PANEL WITH GFR - Abnormal; Notable for the following components:   Sodium 130 (*)    CO2 21 (*)    Glucose, Bld 125 (*)    BUN 21 (*)     Creatinine, Ser 1.48 (*)    Calcium 7.9 (*)    Albumin 2.7 (*)    Alkaline Phosphatase 21 (*)    GFR, Estimated 48 (*)    All other components within normal limits  RESP PANEL BY RT-PCR (RSV, FLU A&B, COVID)  RVPGX2  CULTURE, BLOOD (ROUTINE X 2)  CULTURE, BLOOD (ROUTINE X 2)  HCG, SERUM, QUALITATIVE  MAGNESIUM  TSH  URINALYSIS, ROUTINE W REFLEX MICROSCOPIC  T4, FREE  VITAMIN B1  PHOSPHORUS  AMMONIA  CK  CREATININE, URINE, RANDOM  OSMOLALITY, URINE  OSMOLALITY  PREALBUMIN  PROTIME-INR  PROCALCITONIN  LACTIC ACID, PLASMA  LACTIC ACID, PLASMA  VITAMIN B12  FOLATE  IRON AND TIBC  FERRITIN  RETICULOCYTES  BASIC METABOLIC PANEL WITH GFR  BASIC METABOLIC PANEL WITH GFR  BLOOD GAS, VENOUS  URINALYSIS, COMPLETE (UACMP) WITH MICROSCOPIC  SEDIMENTATION RATE  C-REACTIVE PROTEIN  SYPHILIS: RPR W/REFLEX TO RPR TITER AND TREPONEMAL ANTIBODIES, TRADITIONAL SCREENING AND DIAGNOSIS ALGORITHM  HIV ANTIBODY (ROUTINE TESTING W REFLEX)  ANA W/REFLEX IF POSITIVE  TROPONIN T, HIGH SENSITIVITY  TROPONIN T, HIGH SENSITIVITY    EKG: EKG Interpretation Date/Time:  Wednesday May 10 2024 18:42:51 EST Ventricular Rate:  111 PR Interval:  149 QRS Duration:  72 QT Interval:  347 QTC Calculation: 472 R Axis:   100  Text Interpretation: Sinus tachycardia Borderline right axis deviation Borderline low voltage, extremity leads Abnormal Q suggests anterior infarct qt lengthening Otherwise no significant change Confirmed by Emil Share [phone]) on 05/10/2024 7:26:20 PM  Radiology: CT ABDOMEN PELVIS W CONTRAST Result Date: 05/10/2024 CLINICAL DATA:  Flu like symptoms lymphadenopathy EXAM: CT ABDOMEN AND PELVIS WITH CONTRAST TECHNIQUE: Multidetector CT imaging of the abdomen and pelvis was performed using the standard protocol following bolus administration of intravenous contrast. RADIATION DOSE REDUCTION: This exam was performed according to the departmental dose-optimization program which  includes automated exposure control, adjustment of the mA and/or kV according to patient size and/or use of iterative reconstruction technique. CONTRAST:  80mL OMNIPAQUE  IOHEXOL  350 MG/ML SOLN COMPARISON:  Ultrasound 02/04/2023 FINDINGS: Lower chest: See separately dictated chest CT. Hepatobiliary: No focal liver abnormality is seen. No gallstones, gallbladder wall thickening, or biliary dilatation. Pancreas: Unremarkable. No pancreatic ductal dilatation or surrounding inflammatory changes. Spleen: Upper normal in size at 13 cm Adrenals/Urinary Tract: Adrenal glands are  unremarkable. Kidneys are normal, without renal calculi, focal lesion, or hydronephrosis. Bladder is unremarkable. Stomach/Bowel: Stomach within normal limits. No dilated small bowel. Some fluid in the colon. No acute bowel wall thickening Vascular/Lymphatic: Nonaneurysmal aorta. Multiple subcentimeter retroperitoneal lymph nodes. Mildly enlarged left external iliac and pelvic lymph nodes. On the right, lymph nodes measure up to 14 mm and on the left measure up to 15 mm. Reproductive: Hypodense left uterine mass measuring 6.1 x 5.6 cm, probably a fibroid. No adnexal mass Other: No ascites.  No free air Musculoskeletal: No acute or suspicious osseous abnormality IMPRESSION: 1. No CT evidence for acute intra-abdominal or pelvic abnormality. 2. Multiple small retroperitoneal lymph nodes with mild pelvic and iliac adenopathy, these could be inflammatory or neoplastic in etiology. 3. 6.1 cm hypodense left uterine mass, probably a fibroid. Electronically Signed   By: Luke Bun M.D.   On: 05/10/2024 21:47   CT Soft Tissue Neck W Contrast Result Date: 05/10/2024 CLINICAL DATA:  Initial metastatic disease evaluation. EXAM: CT NECK WITH CONTRAST TECHNIQUE: Multidetector CT imaging of the neck was performed using the standard protocol following the bolus administration of intravenous contrast. RADIATION DOSE REDUCTION: This exam was performed according  to the departmental dose-optimization program which includes automated exposure control, adjustment of the mA and/or kV according to patient size and/or use of iterative reconstruction technique. CONTRAST:  80mL OMNIPAQUE  IOHEXOL  350 MG/ML SOLN COMPARISON:  None Available. FINDINGS: Pharynx and larynx: Oral cavity within normal limits. Palatine tonsils symmetric and within normal limits. Oropharynx and nasopharynx within normal limits. No retropharyngeal collection. Negative epiglottis. Hypopharynx, supraglottic larynx, and glottis within normal limits. Subglottic airway clear. Salivary glands: There is an area of enhancement measuring approximately 2.1 cm at the posterior aspect of the right parotid gland (series 3, image 28). Superimposed 9 mm hypodensity at this location. Contralateral left parotid gland within normal limits. Submandibular glands within normal limits. Thyroid : Normal. Lymph nodes: Increased number of subcentimeter lymph nodes seen extending along the cervical chains bilaterally. No overtly pathologically enlarged lymph nodes. One of these nodes along the left posterior cervical chain measuring 1 cm in short access demonstrates internal hypodensity, consistent with suppuration and/or necrosis (series 3, image 28). Prominent bilateral axillary lymph nodes noted as well, largest of which measures 1.1 cm on the right (series 3, image 97). Vascular: Normal intravascular enhancement seen within the neck. Limited intracranial: Unremarkable. Visualized orbits: Unremarkable. Mastoids and visualized paranasal sinuses: Visualized paranasal sinuses are largely clear. Mastoid air cells and middle ear cavities are largely clear as well. Skeleton: No worrisome osseous lesions. Upper chest: Better evaluated on dedicated CT of the chest performed at the same time. Small layering left pleural effusion partially visualized. Pericardial effusion partially visualized as well. Other: None. IMPRESSION: 1. Increased  number of subcentimeter lymph nodes extending along the cervical chains bilaterally with prominent axillary adenopathy. 1 cm left posterior cervical chain node demonstrates internal hypodensity, consistent with suppuration and/or necrosis. Findings are nonspecific, and could be reactive in nature. Possible nodal metastatic disease or lymphoproliferative disorder would be the primary differential considerations. 2. 2.1 cm area of enhancement with central hypodensity at the posterior aspect of the right parotid gland. Finding is indeterminate, and could reflect an intraparotid lymph node with surrounding enhancement or possibly primary salivary neoplasm. No other overt changes of acute parotitis. 3. Small layering left pleural effusion and pericardial effusion, better evaluated on dedicated CT of the chest performed at the same time. Electronically Signed   By: Morene Nicholes HERO.D.  On: 05/10/2024 21:46   CT Angio Chest PE W and/or Wo Contrast Result Date: 05/10/2024 CLINICAL DATA:  Syncope flu like symptoms EXAM: CT ANGIOGRAPHY CHEST WITH CONTRAST TECHNIQUE: Multidetector CT imaging of the chest was performed using the standard protocol during bolus administration of intravenous contrast. Multiplanar CT image reconstructions and MIPs were obtained to evaluate the vascular anatomy. RADIATION DOSE REDUCTION: This exam was performed according to the departmental dose-optimization program which includes automated exposure control, adjustment of the mA and/or kV according to patient size and/or use of iterative reconstruction technique. CONTRAST:  80mL OMNIPAQUE  IOHEXOL  350 MG/ML SOLN COMPARISON:  Chest x-ray 05/10/2024 FINDINGS: Cardiovascular: Satisfactory opacification of the pulmonary arteries to the segmental level. No evidence of pulmonary embolism. Nonaneurysmal aorta. No dissection. Cardiomegaly. Small moderate pericardial effusion, measures 13 mm maximum thickness on the left side. Mediastinum/Nodes:  Patent trachea. No thyroid  mass. Mild bilateral axillary adenopathy. Lymph nodes on the right measuring up to 15 mm. Lymph nodes on the left measuring up to 13 mm. Multiple mildly enlarged subpectoral nodes. Multiple small bilateral subcentimeter supraclavicular nodes. Incompletely visualized small nodules posterior to the right shoulder measuring up to 10 mm on series 5, image 28. Right paratracheal node measuring up to 10 mm. No significantly enlarged hilar nodes. Lungs/Pleura: Small left-sided pleural effusion. Partial subpleural left lower lobe consolidation. Atelectasis in the lingula. Upper Abdomen: See separately dictated CT Musculoskeletal: No acute osseous abnormality. Review of the MIP images confirms the above findings. IMPRESSION: 1. Negative for acute pulmonary embolus or aortic dissection. 2. Cardiomegaly with small to moderate pericardial effusion. 3. Small left-sided pleural effusion with partial subpleural left lower lobe consolidation, atelectasis versus pneumonia. 4. Mild bilateral axillary and subpectoral adenopathy, indeterminate for reactive versus inflammatory versus neoplastic process. Incompletely visualized small soft tissue nodules posterior to the right shoulder measuring up to 10 mm. Electronically Signed   By: Luke Bun M.D.   On: 05/10/2024 21:37   CT Head Wo Contrast Addendum Date: 05/10/2024 ADDENDUM REPORT: 05/10/2024 21:34 ADDENDUM: Small left occipital soft tissue nodule measuring 5 mm, likely a lymph node. Multiple incompletely visualized Peri auricular and parotid nodules, measuring up to 7 mm on the left and 6 mm on the right, potentially lymph nodes. Electronically Signed   By: Luke Bun M.D.   On: 05/10/2024 21:34   Result Date: 05/10/2024 CLINICAL DATA:  Syncopal episodes EXAM: CT HEAD WITHOUT CONTRAST TECHNIQUE: Contiguous axial images were obtained from the base of the skull through the vertex without intravenous contrast. RADIATION DOSE REDUCTION: This exam  was performed according to the departmental dose-optimization program which includes automated exposure control, adjustment of the mA and/or kV according to patient size and/or use of iterative reconstruction technique. COMPARISON:  CT brain 04/04/2024 FINDINGS: Brain: No evidence of acute infarction, hemorrhage, hydrocephalus, extra-axial collection or mass lesion/mass effect. Vascular: No hyperdense vessel or unexpected calcification. Skull: Normal. Negative for fracture or focal lesion. Sinuses/Orbits: No acute finding. Other: None IMPRESSION: Negative non contrasted CT appearance of the brain. Electronically Signed: By: Luke Bun M.D. On: 05/10/2024 21:23   DG Chest 2 View Result Date: 05/10/2024 EXAM: 2 VIEW(S) XRAY OF THE CHEST 05/10/2024 03:46:31 PM COMPARISON: 03/15/2024 CLINICAL HISTORY: cough, rash, jt pain, fatigue FINDINGS: LUNGS AND PLEURA: Low lung volumes with increased pulmonary vascularity. Subsegmental atelectasis in left mid lung. Small bilateral pleural effusions with overlying subsegmental atelectasis. No pneumothorax. HEART AND MEDIASTINUM: No acute abnormality of the cardiac and mediastinal silhouettes. BONES AND SOFT TISSUES: No acute osseous abnormality. IMPRESSION:  1. Low lung volumes with increased pulmonary vascularity. 2. Small bilateral pleural effusions with overlying atelectasis. Electronically signed by: Waddell Calk MD 05/10/2024 03:55 PM EST RP Workstation: [id number]     .Critical Care  Performed by: Emil Share, DO Authorized by: Emil Share, DO   Critical care provider statement:    Critical care time (minutes):  35   Critical care time was exclusive of:  Separately billable procedures and treating other patients   Critical care was time spent personally by me on the following activities:  Development of treatment plan with patient or surrogate, discussions with consultants, evaluation of patient's response to treatment, examination of patient, ordering and  review of laboratory studies, ordering and review of radiographic studies, ordering and performing treatments and interventions, pulse oximetry, re-evaluation of patient's condition and review of old charts   Care discussed with: admitting provider      Medications Ordered in the ED  0.9 %  sodium chloride  infusion (150 mL/hr Intravenous New Bag/Given 05/10/24 2135)  metroNIDAZOLE  (FLAGYL ) IVPB 500 mg (500 mg Intravenous New Bag/Given 05/10/24 2130)  vancomycin  (VANCOREADY) IVPB 1750 mg/350 mL (1,750 mg Intravenous New Bag/Given 05/10/24 2127)  ceFEPIme  (MAXIPIME ) 2 g in sodium chloride  0.9 % 100 mL IVPB (has no administration in time range)  vancomycin  (VANCOREADY) IVPB 1500 mg/300 mL (has no administration in time range)  sodium chloride  0.9 % bolus 1,000 mL (1,000 mLs Intravenous New Bag/Given 05/10/24 2004)  ketorolac  (TORADOL ) 15 MG/ML injection 15 mg (15 mg Intravenous Given 05/10/24 2005)  sodium chloride  0.9 % bolus 500 mL (500 mLs Intravenous New Bag/Given 05/10/24 2123)  iohexol  (OMNIPAQUE ) 350 MG/ML injection 80 mL (80 mLs Intravenous Contrast Given 05/10/24 2053)  ceFEPIme  (MAXIPIME ) 2 g in sodium chloride  0.9 % 100 mL IVPB (2 g Intravenous New Bag/Given 05/10/24 2145)                                    Medical Decision Making Amount and/or Complexity of Data Reviewed Labs: ordered. Radiology: ordered.  Risk Prescription drug management. Decision regarding hospitalization.   29 yo  F with a chief complaints of multiple episodes where she passed out.  This occurred in her doctor's office.  She was sent here for evaluation.  Feels like the patient has not been doing well for months.  She was at her doctor's to be seen for a thought to be swollen lymph node on the right side of her neck.  By history it sounds like she could be quite dehydrated.  Renal function with AKI.  Will give a bolus of IV fluids.  As she struggled to eat or drink will discuss with medicine for  observation.  The patients results and plan were reviewed and discussed.   Any x-rays performed were independently reviewed by myself.   Differential diagnosis were considered with the presenting HPI.  Medications  0.9 %  sodium chloride  infusion (150 mL/hr Intravenous New Bag/Given 05/10/24 2135)  metroNIDAZOLE  (FLAGYL ) IVPB 500 mg (500 mg Intravenous New Bag/Given 05/10/24 2130)  vancomycin  (VANCOREADY) IVPB 1750 mg/350 mL (1,750 mg Intravenous New Bag/Given 05/10/24 2127)  ceFEPIme  (MAXIPIME ) 2 g in sodium chloride  0.9 % 100 mL IVPB (has no administration in time range)  vancomycin  (VANCOREADY) IVPB 1500 mg/300 mL (has no administration in time range)  sodium chloride  0.9 % bolus 1,000 mL (1,000 mLs Intravenous New Bag/Given 05/10/24 2004)  ketorolac  (TORADOL ) 15 MG/ML injection 15  mg (15 mg Intravenous Given 05/10/24 2005)  sodium chloride  0.9 % bolus 500 mL (500 mLs Intravenous New Bag/Given 05/10/24 2123)  iohexol  (OMNIPAQUE ) 350 MG/ML injection 80 mL (80 mLs Intravenous Contrast Given 05/10/24 2053)  ceFEPIme  (MAXIPIME ) 2 g in sodium chloride  0.9 % 100 mL IVPB (2 g Intravenous New Bag/Given 05/10/24 2145)    Vitals:   05/10/24 1815 05/10/24 1954 05/10/24 2000 05/10/24 2204  BP: 130/70 90/70 95/68    Pulse: (!) 135 (!) 101 100   Resp: 16 (!) 23 (!) 22   Temp: 99.5 F (37.5 C)   98.2 F (36.8 C)  TempSrc: Oral   Oral  SpO2: 100% 100% 98%     Final diagnoses:  Syncope and collapse  Lymphadenopathy    Admission/ observation were discussed with the admitting physician, patient and/or family and they are comfortable with the plan.       Final diagnoses:  Syncope and collapse  Lymphadenopathy    ED Discharge Orders     None          Emil Share, DO 05/10/24 2228  "

## 2024-05-10 NOTE — H&P (Signed)
 "    Sheri Foster FMW:[id number] DOB: 05/23/1995 DOA: 05/10/2024     PCP: Foster Clotilda SAUNDERS, MD   Outpatient Specialists:   Patient arrived to ER on 05/10/24 at 1801 Referred by Attending Emil Share, DO   Patient coming from:    home Lives  With friends     Chief Complaint:   Chief Complaint  Patient presents with   Loss of Consciousness    HPI: Sheri Foster is a 29 y.o. female with medical history significant of  hyperthyroidism     Presented with   fever, neck lymphadenopathy, fatigue, decreased ability to ambulate Patient has been sick for the past 4 to 5 days for fevers up to 101 lymph nodes in her neck which is swollen sore throat, pain worse on the right side of her cheek. She also have had some bodyaches blisters on her hands and feet that has been ongoing for the past few months has been evaluated supposedly for [facility] spotted fever and Lyme disease and workup according to patient has been negative Endorses weight loss No personal history of autoimmune disorders no family history of lupus or RA.  She does have history of eczema But her primary care notes CBC done on 7 January showed smudge cells  Patient went to her primary care provider today and had 2 episodes of seizure-like activity in the office lasting between 15 to 30 seconds with LOC no incontinence no postictal state there was some lightheadedness associated with them.  This occurred when the provider was trying to palpate her inflamed lymph node.  Patient states that she felt very tired and wanted to sleep.  When she woke up there was people around her and she felt like she wanted to go back to sleep no associated chest pain  Noted to be hypotensive blood pressure 90/68 heart rate 130 glucose 113  Felt to be likely syncopal episodes  Patient have had decreased p.o. intake and differences in her diet lately but she has been trying to rehydrate herself by increasing her fluid intake   Reports severe  fatigue she could hardly walk a few feet prior to getting very tired  Denies significant ETOH intake   Does not smoke     Regarding pertinent Chronic problems:        Chronic anemia - baseline hg Hemoglobin & Hematocrit  Recent Labs    04/26/24 0012 05/03/24 0935 05/10/24 1841  HGB 10.4* 10.4* 9.3*   Iron/TIBC/Ferritin/ %Sat    Component Value Date/Time   IRON 49 04/03/2024 1127   TIBC 311 04/03/2024 1127   FERRITIN 55 04/03/2024 1127   IRONPCTSAT 16 04/03/2024 1127    While in ER:    Ct showed diffuse lymphadenopathy Pericardial effusion and possible pneumonia     Lab Orders         Resp panel by RT-PCR (RSV, Flu A&B, Covid) Anterior Nasal Swab         CBC with Differential         Comprehensive metabolic panel         hCG, serum, qualitative         Magnesium         Urinalysis, Routine w reflex microscopic -Urine, Clean Catch         TSH         T4, free      CT HEAD   NON acute     CXR -  1. Low lung volumes  with increased pulmonary vascularity. 2. Small bilateral pleural effusions with overlying atelectasis.  CTabd/pelvis -  Multiple small retroperitoneal lymph nodes with mild pelvic and iliac adenopathy, these could be inflammatory or neoplastic in etiology. 3. 6.1 cm hypodense left uterine mass, probably a fibroid.  CTA chest -   no PE,  1. Negative for acute pulmonary embolus or aortic dissection. 2. Cardiomegaly with small to moderate pericardial effusion. 3. Small left-sided pleural effusion with partial subpleural left lower lobe consolidation, atelectasis versus pneumonia. 4. Mild bilateral axillary and subpectoral adenopathy, indeterminate for reactive versus inflammatory versus neoplastic process. Incompletely visualized small soft tissue nodules posterior to the right shoulder measuring up to 10 mm.  Following Medications were ordered in ER: Medications  sodium chloride  0.9 % bolus 1,000 mL (1,000 mLs Intravenous New Bag/Given 05/10/24  2004)  ketorolac  (TORADOL ) 15 MG/ML injection 15 mg (15 mg Intravenous Given 05/10/24 2005)       ED Triage Vitals  Encounter Vitals Group     BP 05/10/24 1815 130/70     Girls Systolic BP Percentile --      Girls Diastolic BP Percentile --      Boys Systolic BP Percentile --      Boys Diastolic BP Percentile --      Pulse Rate 05/10/24 1815 (!) 135     Resp 05/10/24 1815 16     Temp 05/10/24 1815 99.5 F (37.5 C)     Temp Source 05/10/24 1815 Oral     SpO2 05/10/24 1815 100 %     Weight --      Height --      Head Circumference --      Peak Flow --      Pain Score 05/10/24 1818 0     Pain Loc --      Pain Education --      Exclude from Growth Chart --   UFJK(75)@     _________________________________________ Significant initial  Findings: Abnormal Labs Reviewed  CBC WITH DIFFERENTIAL/PLATELET - Abnormal; Notable for the following components:      Result Value   RBC 3.57 (*)    Hemoglobin 9.3 (*)    HCT 29.3 (*)    All other components within normal limits  COMPREHENSIVE METABOLIC PANEL WITH GFR - Abnormal; Notable for the following components:   Sodium 130 (*)    CO2 21 (*)    Glucose, Bld 125 (*)    BUN 21 (*)    Creatinine, Ser 1.48 (*)    Calcium 7.9 (*)    Albumin 2.7 (*)    Alkaline Phosphatase 21 (*)    GFR, Estimated 48 (*)    All other components within normal limits    ECG: Ordered Personally reviewed and interpreted by me showing: HR : 111 Rhythm:Sinus tachycardia Borderline right axis deviation Borderline low voltage, extremity leads   QTC 472   ____________________ This patient meets SIRS Criteria and may be septic.   The recent clinical data is shown below. Vitals:   05/10/24 1815 05/10/24 1954  BP: 130/70 90/70  Pulse: (!) 135 (!) 101  Resp: 16 (!) 23  Temp: 99.5 F (37.5 C)   TempSrc: Oral   SpO2: 100% 100%      WBC     Component Value Date/Time   WBC 6.3 05/10/2024 1841   LYMPHSABS 1.6 05/10/2024 1841   MONOABS 0.3 05/10/2024  1841   EOSABS 0.0 05/10/2024 1841   BASOSABS 0.0 05/10/2024 1841  Lactic Acid, Venous No results found for: LATICACIDVEN     Procalcitonin   Ordered      UA  ordered     ABX started cefepime  vanc/flagyl     __________________________________________________________ Recent Labs  Lab 05/10/24 1841  NA 130*  K 4.4  CO2 21*  GLUCOSE 125*  BUN 21*  CREATININE 1.48*  CALCIUM 7.9*  MG 2.2    Cr     Up from baseline see below Lab Results  Component Value Date   CREATININE 1.48 (H) 05/10/2024   CREATININE 0.88 05/03/2024   CREATININE 0.74 04/26/2024    Recent Labs  Lab 05/10/24 1841  AST 37  ALT 13  ALKPHOS 21*  BILITOT 0.6  PROT 7.9  ALBUMIN 2.7*   Lab Results  Component Value Date   CALCIUM 7.9 (L) 05/10/2024    Plt: Lab Results  Component Value Date   PLT 270 05/10/2024      Recent Labs  Lab 05/10/24 1841  WBC 6.3  NEUTROABS 4.4  HGB 9.3*  HCT 29.3*  MCV 82.1  PLT 270    HG/HCT  stable,     Component Value Date/Time   HGB 9.3 (L) 05/10/2024 1841   HCT 29.3 (L) 05/10/2024 1841   MCV 82.1 05/10/2024 1841       _______________________________________________ Hospitalist was called for admission for Sepsis   The following Work up has been ordered so far:  Orders Placed This Encounter  Procedures   Resp panel by RT-PCR (RSV, Flu A&B, Covid) Anterior Nasal Swab   DG Chest 2 View   CBC with Differential   Comprehensive metabolic panel   hCG, serum, qualitative   Magnesium   Urinalysis, Routine w reflex microscopic -Urine, Clean Catch   TSH   T4, free   Consult to hospitalist   ED EKG   EKG 12-Lead   EKG 12-Lead     OTHER Significant initial  Findings:  labs showing:     DM  labs:  HbA1C: Recent Labs    07/28/23 1553  HGBA1C 5.7       CBG (last 3)  No results for input(s): GLUCAP in the last 72 hours.        Cultures: No results found for: SDES, SPECREQUEST, CULT, REPTSTATUS   Radiological Exams  on Admission: DG Chest 2 View Result Date: 05/10/2024 EXAM: 2 VIEW(S) XRAY OF THE CHEST 05/10/2024 03:46:31 PM COMPARISON: 03/15/2024 CLINICAL HISTORY: cough, rash, jt pain, fatigue FINDINGS: LUNGS AND PLEURA: Low lung volumes with increased pulmonary vascularity. Subsegmental atelectasis in left mid lung. Small bilateral pleural effusions with overlying subsegmental atelectasis. No pneumothorax. HEART AND MEDIASTINUM: No acute abnormality of the cardiac and mediastinal silhouettes. BONES AND SOFT TISSUES: No acute osseous abnormality. IMPRESSION: 1. Low lung volumes with increased pulmonary vascularity. 2. Small bilateral pleural effusions with overlying atelectasis. Electronically signed by: Waddell Calk MD 05/10/2024 03:55 PM EST RP Workstation: [id number]   _______________________________________________________________________________________________________ Latest  Blood pressure 90/70, pulse (!) 101, temperature 99.5 F (37.5 C), temperature source Oral, resp. rate (!) 23, last menstrual period 05/10/2024, SpO2 100%.   Vitals  labs and radiology finding personally reviewed  Review of Systems:    Pertinent positives include:  Fevers, chills, fatigue, weight loss   Constitutional:  No weight loss, night sweats,  HEENT:  No headaches, Difficulty swallowing,Tooth/dental problems,Sore throat,  No sneezing, itching, ear ache, nasal congestion, post nasal drip,  Cardio-vascular:  No chest pain, Orthopnea, PND, anasarca, dizziness, palpitations.no Bilateral lower extremity swelling  GI:  No heartburn, indigestion, abdominal pain, nausea, vomiting, diarrhea, change in bowel habits, loss of appetite, melena, blood in stool, hematemesis Resp:  no shortness of breath at rest. No dyspnea on exertion, No excess mucus, no productive cough, No non-productive cough, No coughing up of blood.No change in color of mucus.No wheezing. Skin:  no rash or lesions. No jaundice GU:  no dysuria, change in  color of urine, no urgency or frequency. No straining to urinate.  No flank pain.  Musculoskeletal:  No joint pain or no joint swelling. No decreased range of motion. No back pain.  Psych:  No change in mood or affect. No depression or anxiety. No memory loss.  Neuro: no localizing neurological complaints, no tingling, no weakness, no double vision, no gait abnormality, no slurred speech, no confusion  All systems reviewed and apart from HOPI all are negative _______________________________________________________________________________________________ Past Medical History:   Past Medical History:  Diagnosis Date   Depression    Migraines    Thyroid  disease 2019      History reviewed. No pertinent surgical history.  Social History:  Ambulatory   independently      reports that she has never smoked. She has never used smokeless tobacco. She reports current alcohol use. She reports that she does not use drugs.     Family History:   Family History  Problem Relation Age of Onset   Asthma Mother    Hyperlipidemia Mother    Hypertension Mother    Hyperthyroidism Mother    Hypothyroidism Other    ______________________________________________________________________________________________ Allergies: Allergies[1]   Prior to Admission medications  Medication Sig Start Date End Date Taking? Authorizing Provider  cyclobenzaprine  (FLEXERIL ) 10 MG tablet Take 1 tablet (10 mg total) by mouth 2 (two) times daily as needed for muscle spasms. 06/23/23   Ajewole, Christana, MD  Ubrogepant  (UBRELVY ) 50 MG TABS Take 1 tablet (50 mg) at start of migraine.  May repeat dose in 2 hours if headache persists or recurs. 04/03/24   Foster Clotilda SAUNDERS, MD    ___________________________________________________________________________________________________ Physical Exam:    05/10/2024    7:54 PM 05/10/2024    6:15 PM 05/10/2024    4:09 PM  Vitals with BMI  Height   5' 4  Weight   190 lbs   BMI   32.6  Systolic 90 130 118  Diastolic 70 70 78  Pulse 101 135 138     1. General:  in No  Acute distress   Chronically ill   -appearing 2. Psychological: Alert and   Oriented 3. Head/ENT:   Dry Mucous Membranes                          Head Non traumatic, neck supple                          Normal   Dentition 4. SKIN:  decreased Skin turgor,  Skin clean Dry and intact scaring from prior rash on the palms    5. Heart: Regular rate and rhythm no  Murmur, no Rub or gallop 6. Lungs:  no wheezes or crackles   7. Abdomen: Soft,  non-tender, Non distended   obese  bowel sounds present 8. Lower extremities: no clubbing, cyanosis, no  edema 9. Neurologically Grossly intact, moving all 4 extremities equally  10. MSK: Normal range of motion  Right lymphadenopathy under right ear firm and tender  Chart has been reviewed  ______________________________________________________________________________________________  Assessment/Plan 29 y.o. female with medical history significant of  hyperthyroidism    Admitted for  diffuse lymphadenopathy, CAP, dehydration, syncope  Present on Admission:  Sepsis (HCC)  AKI (acute kidney injury)  Hyperthyroidism  Syncope, vasovagal  Lymphadenopathy  Syncope  CAP (community acquired pneumonia)  Hyponatremia     Sepsis (HCC)  -SIRS criteria met with     Component Value Date/Time   WBC 6.3 05/10/2024 1841   LYMPHSABS 1.6 05/10/2024 1841     tachycardia   ,   fever   Today's Vitals   05/10/24 1815 05/10/24 1818 05/10/24 1954 05/10/24 2005  BP: 130/70  90/70   Pulse: (!) 135  (!) 101   Resp: 16  (!) 23   Temp: 99.5 F (37.5 C)     TempSrc: Oral     SpO2: 100%  100%   PainSc:  0-No pain  6       Source of sepsis is unknown but given clinical picture will continue to treat   Patient meeting criteria for Severe sepsis with    evidence of end organ damage/organ dysfunction such as   Acute Kidney Injury with Cr > 2,  Lab Results   Component Value Date   CREATININE 1.48 (H) 05/10/2024   CREATININE 0.88 05/03/2024     - Obtain serial lactic acid and procalcitonin level.  - Initiated IV antibiotics in ER:   Will continue  on : vanc cefepime  and metronidazole  for now   - await results of blood and urine culture  - Rehydrate aggressively  Intravenous fluids were administered,          30cc/kg fluid   8:39 PM   AKI (acute kidney injury) -  evidence of acute renal failure due to presence of following: Cr increased >0.3 from baseline   likely secondary to dehydration,         check FeNA       Rehydrate with IV fluids    Hyperthyroidism Check TSH  Syncope, vasovagal In the setting of pain and dehydration. Rehydrate check orthostatics prior to discharge Obtain echogram  Lymphadenopathy Obtain CT of soft tissues of the neck. Given weight loss fevers evaluate for underlying malignancy obtain CT abdomen pelvis chest Of note primary care provider noted that patient did have smudge cells on her smear on the seventh today no   abnormalities noted but patient probably would benefit from outpatient follow-up with oncology pending further results 11:13 PM CT showed significant Lymphadenopathy will need biopsy  CAP (community acquired pneumonia)  - -Patient presenting with  productive cough, fever  on chest CT  -This appears to be most likely community-acquired pneumonia.      will admit for treatment of CAP will start on appropriate antibiotic coverage. Cefepime / vanc   Obtain:  sputum cultures,                                  COVID PCR negative                    blood cultures and sputum cultures ordered                   strep pneumo UA antigen,                 check for Legionella antigen.  Provide oxygen as needed.    Hyponatremia In the setting of dehydration Obtain urine electrolytes and rehydrate    Other plan as per orders.  DVT prophylaxis:  SCD     Code Status:     Code Status: Not on file FULL CODE  as per patient   I had personally discussed CODE STATUS with patient   ACP   none   Family Communication:   Family  at  Bedside  plan of care was discussed   with  Sister,   Diet npo post midnight   Disposition Plan:      To home once workup is complete and patient is stable   Following barriers for discharge:                              Syncope  work up is complete                            Electrolytes corrected                                                           Will need consultants to evaluate patient prior to discharge                              Consult Orders  (From admission, onward)           Start     Ordered   05/10/24 1949  Consult to hospitalist  Once       Provider:  (Not yet assigned)  Question Answer Comment  Place call to: [facility]   Reason for Consult Admit      05/10/24 1948                              Nutrition    consulted                 Consults called: IR   Admission status:  ED Disposition     ED Disposition  Admit   Condition  --   Comment  Hospital Area: [facility] [[id number]]  Level of Care: Progressive [102]  Admit to Progressive based on following criteria: CARDIOVASCULAR & THORACIC of moderate stability with acute coronary syndrome symptoms/low risk myocardial infarction/hypertensive urgency/arrhythmias/heart failure potentially compromising stability and stable post cardiovascular intervention patients.  May place patient in observation at [facility] or [facility] if equivalent level of care is available:: No  Diagnosis: Syncope [206001]  Admitting Physician: Perry Brucato [[id number]]  Attending Physician: Lani Havlik [[id number]]           Obs     Level of care         progressive        Blease Quiver 05/10/2024, 11:34 PM    [facility]     after 2 AM please page floor coverage   If 7AM-7PM, please contact the day team  taking care of the patient using [email or web address]        [1] No Known Allergies  "

## 2024-05-10 NOTE — ED Triage Notes (Signed)
 Pt here from PCP after PCP noted pt had 2 syncopal episodes in the wheel chair. No fall. Flu like symptoms for the last few days.

## 2024-05-10 NOTE — ED Notes (Signed)
 Pt informed about there UA. Not able to give sample at this moment, pt will let us  know when she can.

## 2024-05-10 NOTE — Progress Notes (Addendum)
 "  ACUTE VISIT Chief Complaint  Patient presents with   Extremity Weakness   Discussed the use of AI scribe software for clinical note transcription with the patient, who gave verbal consent to proceed.  History of Present Illness Sheri Foster is a 29 year old female with past medical history significant for eczema and hypothyroidism who presents with fatigue, fever, and lymphadenopathy.  She has experienced fever for the past four to five days, with temperatures exceeding 101F. The fever episodes occur mainly at night, disrupting her sleep, and subside with cooling measures such as sitting under a fan or drinking cold fluids.  She reports a tender, swollen lymph node on her neck, which she describes as 'a brick.' Noted 4-5 days ago but more tender and noticeable since yesterday. No associated sore throat. Pain radiated to right side of her face, she finds some relief with massaging right cheek.  She has a chronic cough and some difficulty breathing, persisting for several months, unrelated to the recent fever. No recent travel or exposure to sick contacts is reported.  She has a history of joint pain, body aches, and a rash, described as blisters on her hands and feet, with associated swelling. These symptoms have been present for months, and she has been evaluated for conditions like [facility] spotted fever and Lyme disease, both of which were negative. She completed a course of doxycycline  prescribed after an ER visit, with no resolution of her chronic symptoms.  She denies night sweats but reports significant fatigue and a ten-pound weight loss over several months due to decreased appetite.  She denies a family history of autoimmune conditions like lupus or rheumatoid arthritis.  Lab Results  Component Value Date   WBC 4.3 05/03/2024   HGB 10.4 (L) 05/03/2024   HCT 32.0 (L) 05/03/2024   MCV 80.0 05/03/2024   PLT 319.0 05/03/2024   Lab Results  Component Value Date   NA  133 (L) 05/03/2024   CL 104 05/03/2024   K 3.6 05/03/2024   CO2 24 05/03/2024   BUN 12 05/03/2024   CREATININE 0.88 05/03/2024   GFR 88.00 05/03/2024   CALCIUM 8.8 05/03/2024   ALBUMIN 3.1 (L) 05/03/2024   GLUCOSE 83 05/03/2024   Lab Results  Component Value Date   ALT 11 05/03/2024   AST 22 05/03/2024   ALKPHOS 32 (L) 05/03/2024   BILITOT 0.5 05/03/2024   Review of Systems  Constitutional:  Positive for activity change, appetite change, fatigue and fever.  HENT:  Negative for mouth sores, sore throat and trouble swallowing.   Eyes:  Negative for discharge and redness.  Respiratory:  Negative for wheezing and stridor.   Cardiovascular:  Negative for chest pain, palpitations and leg swelling.  Gastrointestinal:  Negative for abdominal pain, blood in stool, nausea and vomiting.  Endocrine: Negative for cold intolerance and heat intolerance.  Genitourinary:  Negative for decreased urine volume, dysuria and hematuria.  Musculoskeletal:  Negative for arthralgias, gait problem and myalgias.  Skin:  Negative for wound.  Neurological:  Negative for facial asymmetry.  Hematological:  Positive for adenopathy.  See other pertinent positives and negatives in HPI.  Medications Ordered Prior to Encounter[1]  Past Medical History:  Diagnosis Date   Depression    Migraines    Thyroid  disease 2019   Allergies[2]  Social History   Socioeconomic History   Marital status: Single    Spouse name: Not on file   Number of children: Not on file  Years of education: Not on file   Highest education level: Associate degree: occupational, scientist, product/process development, or vocational program  Occupational History   Not on file  Tobacco Use   Smoking status: Never   Smokeless tobacco: Never  Substance and Sexual Activity   Alcohol use: Yes   Drug use: Never   Sexual activity: Not Currently  Other Topics Concern   Not on file  Social History Narrative   Not on file   Social Drivers of Health    Tobacco Use: Low Risk (05/10/2024)   Patient History    Smoking Tobacco Use: Never    Smokeless Tobacco Use: Never    Passive Exposure: Not on file  Financial Resource Strain: Low Risk (03/02/2024)   Overall Financial Resource Strain (CARDIA)    Difficulty of Paying Living Expenses: Not hard at all  Food Insecurity: No Food Insecurity (03/02/2024)   Epic    Worried About Programme Researcher, Broadcasting/film/video in the Last Year: Never true    Ran Out of Food in the Last Year: Never true  Transportation Needs: No Transportation Needs (03/02/2024)   Epic    Lack of Transportation (Medical): No    Lack of Transportation (Non-Medical): No  Physical Activity: Insufficiently Active (03/02/2024)   Exercise Vital Sign    Days of Exercise per Week: 2 days    Minutes of Exercise per Session: 30 min  Stress: No Stress Concern Present (03/02/2024)   [employer] of Occupational Health - Occupational Stress Questionnaire    Feeling of Stress: Only a little  Social Connections: Moderately Isolated (03/02/2024)   Social Connection and Isolation Panel    Frequency of Communication with Friends and Family: More than three times a week    Frequency of Social Gatherings with Friends and Family: More than three times a week    Attends Religious Services: More than 4 times per year    Active Member of Clubs or Organizations: No    Attends Banker Meetings: Not on file    Marital Status: Never married  Depression (PHQ2-9): Low Risk (05/03/2024)   Depression (PHQ2-9)    PHQ-2 Score: 0  Alcohol Screen: Low Risk (03/02/2024)   Alcohol Screen    Last Alcohol Screening Score (AUDIT): 1  Housing: Low Risk (03/02/2024)   Epic    Unable to Pay for Housing in the Last Year: No    Number of Times Moved in the Last Year: 0    Homeless in the Last Year: No  Utilities: Not on file  Health Literacy: Not on file   Vitals:   05/10/24 1609  BP: 118/78  Pulse: (!) 138  Resp: 16  Temp: (!) 102.7 F (39.3 C)  SpO2:  99%   Body mass index is 32.61 kg/m.  Physical Exam Vitals and nursing note reviewed.  Constitutional:      General: She is not in acute distress.    Appearance: She is well-developed. She is ill-appearing and diaphoretic.  HENT:     Head: Normocephalic and atraumatic.     Mouth/Throat:     Mouth: Mucous membranes are moist.     Pharynx: Oropharynx is clear.  Eyes:     Conjunctiva/sclera: Conjunctivae normal.  Neck:   Cardiovascular:     Rate and Rhythm: Regular rhythm. Tachycardia present.     Heart sounds: No murmur heard. Pulmonary:     Effort: Pulmonary effort is normal. No respiratory distress.     Breath sounds: Normal breath sounds.  Abdominal:  Palpations: Abdomen is soft.     Tenderness: There is no abdominal tenderness.  Musculoskeletal:     Cervical back: No edema or erythema.     Right lower leg: No edema.     Left lower leg: No edema.  Lymphadenopathy:     Head:     Right side of head: No submandibular adenopathy.     Left side of head: No submandibular adenopathy.     Cervical: Cervical adenopathy present.     Right cervical: Posterior cervical adenopathy present.  Skin:    General: Skin is warm.     Findings: No erythema or rash.     Comments: Post inflammatory pigmentation changes on hands, no active rash appreciated.  Neurological:     General: No focal deficit present.     Mental Status: She is alert and oriented to person, place, and time.     Motor: Seizure activity (seizure like generalize movement x 2, 15 sec and 30 sec) present.     Comments: In a wheel chair.  Psychiatric:        Mood and Affect: Mood and affect normal.   ASSESSMENT AND PLAN:  Ms. Arla Boutwell was seen today for extremity weakness.  Diagnoses and all orders for this visit:  Orders Placed This Encounter  Procedures   CBC with Differential/Platelet   CK   POC Glucose (CBG)   Fever, unspecified fever cause Problem started 4 to 5 days ago.No sick contact or recent  travel. She has had cough for a few weeks. CXR in 02/2024 showed question vague left costophrenic angle airspace opacity, today CXR was done and showed small bilateral pleural effusion with atelectasis. We discussed possible etiologies, could be caused by viral illness or related to other myriad of symptoms she has had for the past few months.  -     Lupus (SLE) Analysis -     C-reactive protein  Neck mass Lymphadenopathy. Noted 4-5 days ago with associated fever, tender. We discussed possible causes. CBC done on 05/03/24 showed smuggle cells, so lymphoproliferative process needs to be considered.  She is going to the ER today, to will hold on oncology referral.  Multiple joint pain With associated generalized myalgias, rash on palms, fatigue. Mobility has been affected due to pain. Problem has been going on for a couple months. Treated for possible [facility] spotted fever, completed doxycycline  treatment. She has undergone extensive workup, some labs still pending. Pending appointment with ID. Rheumatologic disorders to be consider, lupus and dermatomyositis among some.  -     Lupus (SLE) Analysis -     C-reactive protein -     CK; Future  Observed seizure-like activity (HCC) During examination she had 2 episodes of seizure-like activity, 15 and 30 seconds approximately with LOC, no urine or bowel incontinence, no postictal like symptoms, oriented x 3.  She does not recall episodes. Event was proceeded by dizziness and diaphoresis, stated that she needed to lie down. EMS was called, she was transported to the ER. Right after episode BP was 90/65,HR 130,glucose 113, and pulse O2 96 at RA.  -     POCT glucose (manual entry)  Return Sent to the ER via EMS.  Audrie Kuri G. Montoya Brandel, MD  [facility]. [place] office.      [1]  Current Outpatient Medications on File Prior to Visit  Medication Sig Dispense Refill   cyclobenzaprine  (FLEXERIL ) 10 MG tablet Take 1 tablet (10  mg total) by mouth 2 (two)  times daily as needed for muscle spasms. 30 tablet 2   Ubrogepant  (UBRELVY ) 50 MG TABS Take 1 tablet (50 mg) at start of migraine.  May repeat dose in 2 hours if headache persists or recurs. 30 tablet 3   No current facility-administered medications on file prior to visit.  [2] No Known Allergies  "

## 2024-05-10 NOTE — Assessment & Plan Note (Signed)
 In the setting of dehydration Obtain urine electrolytes and rehydrate

## 2024-05-10 NOTE — ED Notes (Signed)
 Red, Blue, Gold, Dark green save tubes sent to lab if needed.

## 2024-05-10 NOTE — Assessment & Plan Note (Signed)
 In the setting of pain and dehydration. Rehydrate check orthostatics prior to discharge Obtain echogram

## 2024-05-10 NOTE — Assessment & Plan Note (Signed)
-    evidence of acute renal failure due to presence of following: Cr increased >0.3 from baseline  likely secondary to dehydration,        check FeNA       Rehydrate with IV fluids

## 2024-05-10 NOTE — Assessment & Plan Note (Addendum)
 Obtain CT of soft tissues of the neck. Given weight loss fevers evaluate for underlying malignancy obtain CT abdomen pelvis chest Of note primary care provider noted that patient did have smudge cells on her smear on the seventh today no   abnormalities noted but patient probably would benefit from outpatient follow-up with oncology pending further results 11:13 PM CT showed significant Lymphadenopathy will need biopsy

## 2024-05-10 NOTE — Progress Notes (Signed)
 Pharmacy Antibiotic Note  Sheri Foster is a 29 y.o. female admitted on 05/10/2024 with suspected sepsis.  Came from MD office due to weakness. Flu-like symptoms in recent days and multiple syncopal episodes.  Pharmacy has been consulted for Vancomycin  and Cefepime  dosing.  Plan: Vancomycin  1750mg  IV x 1 followed by Vancomycin  1500 mg IV Q 24 hrs. Goal AUC 400-550. Expected AUC: 546.3  SCr used: 1.48 Cefepime  2g IV q12h Metronidazole  500mg  IV q12h Follow renal function F/u culture results & sensitivities     Temp (24hrs), Avg:101.1 F (38.4 C), Min:99.5 F (37.5 C), Max:102.7 F (39.3 C)  Recent Labs  Lab 05/10/24 1841  WBC 6.3  CREATININE 1.48*    Estimated Creatinine Clearance: 59.1 mL/min (A) (by C-G formula based on SCr of 1.48 mg/dL (H)).    Allergies[1]  Antimicrobials this admission: 1/14 Metronidazole  >>   1/14 Cefepime  >>   1/14 Vancomycin  >>  Dose adjustments this admission:    Microbiology results: 1/14 BCx:   1/14 UCx:       Thank you for allowing pharmacy to be a part of this patients care.  Arvin Gauss, PharmD 05/10/2024 8:51 PM     [1] No Known Allergies

## 2024-05-10 NOTE — Patient Instructions (Addendum)
 [name]

## 2024-05-10 NOTE — Assessment & Plan Note (Signed)
" -  SIRS criteria met with     Component Value Date/Time   WBC 6.3 05/10/2024 1841   LYMPHSABS 1.6 05/10/2024 1841     tachycardia   ,   fever   Today's Vitals   05/10/24 1815 05/10/24 1818 05/10/24 1954 05/10/24 2005  BP: 130/70  90/70   Pulse: (!) 135  (!) 101   Resp: 16  (!) 23   Temp: 99.5 F (37.5 C)     TempSrc: Oral     SpO2: 100%  100%   PainSc:  0-No pain  6       Source of sepsis is unknown but given clinical picture will continue to treat   Patient meeting criteria for Severe sepsis with    evidence of end organ damage/organ dysfunction such as   Acute Kidney Injury with Cr > 2,  Lab Results  Component Value Date   CREATININE 1.48 (H) 05/10/2024   CREATININE 0.88 05/03/2024     - Obtain serial lactic acid and procalcitonin level.  - Initiated IV antibiotics in ER:   Will continue  on : vanc cefepime  and metronidazole  for now   - await results of blood and urine culture  - Rehydrate aggressively  Intravenous fluids were administered,          30cc/kg fluid   8:39 PM  "

## 2024-05-10 NOTE — ED Provider Triage Note (Signed)
 Emergency Medicine Provider Triage Evaluation Note  Sheri Foster , a 29 y.o. female  was evaluated in triage.  Pt complains of syncope.  Patient reportedly had syncopal episode while at her PCPs office today.  Had 2 episodes.  She reports flulike symptoms last of days including feelings of head pain warmness, mild cough, and poor appetite.  Denies any vomiting or diarrhea.  Review of Systems  Positive: As above Negative: As above  Physical Exam  BP 130/70 (BP Location: Left Arm)   Pulse (!) 135   Temp 99.5 F (37.5 C) (Oral)   Resp 16   LMP 05/10/2024 (Approximate)   SpO2 100%  Gen:   Awake, no distress   Resp:  Normal effort  MSK:   Moves extremities without difficulty  Other:    Medical Decision Making  Medically screening exam initiated at 6:32 PM.  Appropriate orders placed.  Laurissa T. Harries was informed that the remainder of the evaluation will be completed by another provider, this initial triage assessment does not replace that evaluation, and the importance of remaining in the ED until their evaluation is complete.     Jamorris Ndiaye A, PA-C 05/10/24 1832

## 2024-05-10 NOTE — Assessment & Plan Note (Signed)
" - -  Patient presenting with  productive cough, fever  on chest CT  -This appears to be most likely community-acquired pneumonia.      will admit for treatment of CAP will start on appropriate antibiotic coverage. Cefepime / vanc   Obtain:  sputum cultures,                                  COVID PCR negative                    blood cultures and sputum cultures ordered                   strep pneumo UA antigen,                 check for Legionella antigen.                Provide oxygen as needed.   "

## 2024-05-11 ENCOUNTER — Observation Stay (HOSPITAL_COMMUNITY)

## 2024-05-11 DIAGNOSIS — E871 Hypo-osmolality and hyponatremia: Secondary | ICD-10-CM | POA: Diagnosis present

## 2024-05-11 DIAGNOSIS — D509 Iron deficiency anemia, unspecified: Secondary | ICD-10-CM

## 2024-05-11 DIAGNOSIS — Z825 Family history of asthma and other chronic lower respiratory diseases: Secondary | ICD-10-CM | POA: Diagnosis not present

## 2024-05-11 DIAGNOSIS — Z8349 Family history of other endocrine, nutritional and metabolic diseases: Secondary | ICD-10-CM | POA: Diagnosis not present

## 2024-05-11 DIAGNOSIS — R531 Weakness: Secondary | ICD-10-CM

## 2024-05-11 DIAGNOSIS — R55 Syncope and collapse: Secondary | ICD-10-CM | POA: Diagnosis present

## 2024-05-11 DIAGNOSIS — R509 Fever, unspecified: Secondary | ICD-10-CM | POA: Diagnosis not present

## 2024-05-11 DIAGNOSIS — A419 Sepsis, unspecified organism: Secondary | ICD-10-CM | POA: Diagnosis present

## 2024-05-11 DIAGNOSIS — Z1152 Encounter for screening for COVID-19: Secondary | ICD-10-CM | POA: Diagnosis not present

## 2024-05-11 DIAGNOSIS — M329 Systemic lupus erythematosus, unspecified: Secondary | ICD-10-CM | POA: Diagnosis present

## 2024-05-11 DIAGNOSIS — R6520 Severe sepsis without septic shock: Secondary | ICD-10-CM | POA: Diagnosis present

## 2024-05-11 DIAGNOSIS — R591 Generalized enlarged lymph nodes: Secondary | ICD-10-CM | POA: Diagnosis not present

## 2024-05-11 DIAGNOSIS — E279 Disorder of adrenal gland, unspecified: Secondary | ICD-10-CM | POA: Diagnosis present

## 2024-05-11 DIAGNOSIS — E860 Dehydration: Secondary | ICD-10-CM | POA: Diagnosis present

## 2024-05-11 DIAGNOSIS — Z6832 Body mass index (BMI) 32.0-32.9, adult: Secondary | ICD-10-CM | POA: Diagnosis not present

## 2024-05-11 DIAGNOSIS — G43909 Migraine, unspecified, not intractable, without status migrainosus: Secondary | ICD-10-CM | POA: Diagnosis present

## 2024-05-11 DIAGNOSIS — Z8249 Family history of ischemic heart disease and other diseases of the circulatory system: Secondary | ICD-10-CM | POA: Diagnosis not present

## 2024-05-11 DIAGNOSIS — I3139 Other pericardial effusion (noninflammatory): Secondary | ICD-10-CM | POA: Diagnosis present

## 2024-05-11 DIAGNOSIS — E0590 Thyrotoxicosis, unspecified without thyrotoxic crisis or storm: Secondary | ICD-10-CM | POA: Diagnosis present

## 2024-05-11 DIAGNOSIS — E66811 Obesity, class 1: Secondary | ICD-10-CM | POA: Diagnosis present

## 2024-05-11 DIAGNOSIS — Z83438 Family history of other disorder of lipoprotein metabolism and other lipidemia: Secondary | ICD-10-CM | POA: Diagnosis not present

## 2024-05-11 DIAGNOSIS — J189 Pneumonia, unspecified organism: Secondary | ICD-10-CM | POA: Diagnosis present

## 2024-05-11 DIAGNOSIS — Z79899 Other long term (current) drug therapy: Secondary | ICD-10-CM | POA: Diagnosis not present

## 2024-05-11 DIAGNOSIS — M3212 Pericarditis in systemic lupus erythematosus: Secondary | ICD-10-CM | POA: Diagnosis not present

## 2024-05-11 DIAGNOSIS — N179 Acute kidney failure, unspecified: Secondary | ICD-10-CM | POA: Diagnosis present

## 2024-05-11 HISTORY — PX: IR US LIVER BIOPSY: IMG936

## 2024-05-11 LAB — URINALYSIS, COMPLETE (UACMP) WITH MICROSCOPIC
Bacteria, UA: NONE SEEN
Bilirubin Urine: NEGATIVE
Glucose, UA: NEGATIVE mg/dL
Ketones, ur: NEGATIVE mg/dL
Leukocytes,Ua: NEGATIVE
Nitrite: NEGATIVE
Protein, ur: 30 mg/dL — AB
RBC / HPF: >50 RBC/hpf (ref 0–5)
Specific Gravity, Urine: 1.027 (ref 1.005–1.030)
pH: 5 (ref 5.0–8.0)

## 2024-05-11 LAB — BASIC METABOLIC PANEL WITH GFR
Anion gap: 6 (ref 5–15)
Anion gap: 5 (ref 5–15)
Anion gap: 7 (ref 5–15)
BUN: 13 mg/dL (ref 6–20)
BUN: 19 mg/dL (ref 6–20)
BUN: 19 mg/dL (ref 6–20)
CO2: 20 mmol/L — ABNORMAL LOW (ref 22–32)
CO2: 20 mmol/L — ABNORMAL LOW (ref 22–32)
CO2: 22 mmol/L (ref 22–32)
Calcium: 7.1 mg/dL — ABNORMAL LOW (ref 8.9–10.3)
Calcium: 7.5 mg/dL — ABNORMAL LOW (ref 8.9–10.3)
Calcium: 7.8 mg/dL — ABNORMAL LOW (ref 8.9–10.3)
Chloride: 108 mmol/L (ref 98–111)
Chloride: 109 mmol/L (ref 98–111)
Chloride: 110 mmol/L (ref 98–111)
Creatinine, Ser: 1.19 mg/dL — ABNORMAL HIGH (ref 0.44–1.00)
Creatinine, Ser: 0.86 mg/dL (ref 0.44–1.00)
Creatinine, Ser: 0.88 mg/dL (ref 0.44–1.00)
GFR, Estimated: >60 mL/min
GFR, Estimated: >60 mL/min
GFR, Estimated: >60 mL/min
Glucose, Bld: 111 mg/dL — ABNORMAL HIGH (ref 70–99)
Glucose, Bld: 89 mg/dL (ref 70–99)
Glucose, Bld: 97 mg/dL (ref 70–99)
Potassium: 5.1 mmol/L (ref 3.5–5.1)
Potassium: 4 mmol/L (ref 3.5–5.1)
Potassium: 4.3 mmol/L (ref 3.5–5.1)
Sodium: 134 mmol/L — ABNORMAL LOW (ref 135–145)
Sodium: 135 mmol/L (ref 135–145)
Sodium: 137 mmol/L (ref 135–145)

## 2024-05-11 LAB — CBC WITH DIFFERENTIAL/PLATELET
Basophils Absolute: 0.1 K/uL (ref 0.0–0.1)
Basophils Relative: 1.2 % (ref 0.0–3.0)
Eosinophils Absolute: 0 K/uL (ref 0.0–0.7)
Eosinophils Relative: 0.3 % (ref 0.0–5.0)
HCT: 29.9 % — ABNORMAL LOW (ref 36.0–46.0)
Hemoglobin: 9.8 g/dL — ABNORMAL LOW (ref 12.0–15.0)
Lymphocytes Relative: 25.5 % (ref 12.0–46.0)
Lymphs Abs: 1.3 K/uL (ref 0.7–4.0)
MCHC: 32.9 g/dL (ref 30.0–36.0)
MCV: 79.2 fl (ref 78.0–100.0)
Monocytes Absolute: 0.3 K/uL (ref 0.1–1.0)
Monocytes Relative: 5.1 % (ref 3.0–12.0)
Neutro Abs: 3.4 K/uL (ref 1.4–7.7)
Neutrophils Relative %: 67.9 % (ref 43.0–77.0)
Platelets: 242 K/uL (ref 150.0–400.0)
RBC: 3.77 Mil/uL — ABNORMAL LOW (ref 3.87–5.11)
RDW: 13.8 % (ref 11.5–15.5)
WBC: 5 K/uL (ref 4.0–10.5)

## 2024-05-11 LAB — TROPONIN T, HIGH SENSITIVITY: Troponin T High Sensitivity: 46 ng/L — ABNORMAL HIGH (ref 0–19)

## 2024-05-11 LAB — SYPHILIS: RPR W/REFLEX TO RPR TITER AND TREPONEMAL ANTIBODIES, TRADITIONAL SCREENING AND DIAGNOSIS ALGORITHM: RPR Ser Ql: NONREACTIVE

## 2024-05-11 LAB — OSMOLALITY: Osmolality: 296 mosm/kg — ABNORMAL HIGH (ref 275–295)

## 2024-05-11 LAB — IRON AND TIBC
Iron: 27 ug/dL — ABNORMAL LOW (ref 28–170)
Saturation Ratios: 17 % (ref 10.4–31.8)
TIBC: 155 ug/dL — ABNORMAL LOW (ref 250–450)
UIBC: 129 ug/dL

## 2024-05-11 LAB — STREP PNEUMONIAE URINARY ANTIGEN: Strep Pneumo Urinary Antigen: NEGATIVE

## 2024-05-11 LAB — BLOOD GAS, VENOUS
Acid-base deficit: 2.3 mmol/L — ABNORMAL HIGH (ref 0.0–2.0)
Bicarbonate: 24.1 mmol/L (ref 20.0–28.0)
O2 Saturation: 31.7 %
Patient temperature: 37
pCO2, Ven: 49 mmHg (ref 44–60)
pH, Ven: 7.3 (ref 7.25–7.43)
pO2, Ven: <31 mmHg — CL (ref 32–45)

## 2024-05-11 LAB — SEDIMENTATION RATE: Sed Rate: 70 mm/h — ABNORMAL HIGH (ref 0–22)

## 2024-05-11 LAB — PROCALCITONIN: Procalcitonin: 0.32 ng/mL

## 2024-05-11 LAB — PREALBUMIN: Prealbumin: 5 mg/dL — ABNORMAL LOW (ref 18–38)

## 2024-05-11 LAB — ECHOCARDIOGRAM COMPLETE
Area-P 1/2: 4.58 cm2
Height: 64 in
S' Lateral: 2.5 cm
Weight: 3040 [oz_av]

## 2024-05-11 LAB — RETICULOCYTES
Immature Retic Fract: 11.3 % (ref 2.3–15.9)
RBC.: 2.92 MIL/uL — ABNORMAL LOW (ref 3.87–5.11)
Retic Count, Absolute: 23.7 K/uL (ref 19.0–186.0)
Retic Ct Pct: 0.8 % (ref 0.4–3.1)

## 2024-05-11 LAB — PROTIME-INR
INR: 1.3 — ABNORMAL HIGH (ref 0.8–1.2)
Prothrombin Time: 16.8 s — ABNORMAL HIGH (ref 11.4–15.2)

## 2024-05-11 LAB — OSMOLALITY, URINE: Osmolality, Ur: 340 mosm/kg (ref 300–900)

## 2024-05-11 LAB — AMMONIA: Ammonia: 14 umol/L (ref 9–35)

## 2024-05-11 LAB — CK
Total CK: 233 U/L — ABNORMAL HIGH (ref 17–177)
Total CK: 183 U/L (ref 38–234)

## 2024-05-11 LAB — C-REACTIVE PROTEIN
CRP: 7.9 mg/dL (ref 1.0–20.0)
CRP: 5.7 mg/dL — ABNORMAL HIGH

## 2024-05-11 LAB — VITAMIN B12: Vitamin B-12: 912 pg/mL (ref 180–914)

## 2024-05-11 LAB — FERRITIN: Ferritin: 595 ng/mL — ABNORMAL HIGH (ref 11–307)

## 2024-05-11 LAB — HIV ANTIBODY (ROUTINE TESTING W REFLEX): HIV Screen 4th Generation wRfx: NONREACTIVE

## 2024-05-11 LAB — CREATININE, URINE, RANDOM: Creatinine, Urine: 201 mg/dL

## 2024-05-11 LAB — PHOSPHORUS: Phosphorus: 4.2 mg/dL (ref 2.5–4.6)

## 2024-05-11 LAB — T4, FREE: Free T4: 0.83 ng/dL (ref 0.80–2.00)

## 2024-05-11 LAB — LACTIC ACID, PLASMA: Lactic Acid, Venous: 0.8 mmol/L (ref 0.5–1.9)

## 2024-05-11 LAB — FOLATE: Folate: 8 ng/mL

## 2024-05-11 MED ORDER — ONDANSETRON HCL 4 MG PO TABS: 4.0000 mg | ORAL_TABLET | Freq: Four times a day (QID) | ORAL | Status: DC | PRN

## 2024-05-11 MED ORDER — SODIUM CHLORIDE 0.9 % IV SOLN: INTRAVENOUS | Status: AC

## 2024-05-11 MED ORDER — FENTANYL CITRATE (PF) 50 MCG/ML IJ SOSY
12.5000 ug | PREFILLED_SYRINGE | INTRAMUSCULAR | Status: DC | PRN
  Administered 2024-05-13: 50 ug via INTRAVENOUS
  Filled 2024-05-11: qty 1

## 2024-05-11 MED ORDER — SODIUM CHLORIDE 0.9 % IV SOLN
1.0000 g | Freq: Every day | INTRAVENOUS | Status: DC
  Administered 2024-05-11 – 2024-05-14 (×4): 1 g via INTRAVENOUS
  Filled 2024-05-11 (×4): qty 10

## 2024-05-11 MED ORDER — HYDROCODONE-ACETAMINOPHEN 5-325 MG PO TABS
1.0000 | ORAL_TABLET | ORAL | Status: DC | PRN
  Administered 2024-05-13 – 2024-05-14 (×2): 2 via ORAL
  Filled 2024-05-11 (×2): qty 2

## 2024-05-11 MED ORDER — AZITHROMYCIN 250 MG PO TABS
250.0000 mg | ORAL_TABLET | Freq: Every day | ORAL | Status: AC
  Administered 2024-05-11 – 2024-05-15 (×5): 250 mg via ORAL
  Filled 2024-05-11 (×5): qty 1

## 2024-05-11 MED ORDER — ACETAMINOPHEN 325 MG PO TABS
650.0000 mg | ORAL_TABLET | Freq: Four times a day (QID) | ORAL | Status: DC | PRN
  Administered 2024-05-11: 650 mg via ORAL
  Filled 2024-05-11 (×2): qty 2

## 2024-05-11 MED ORDER — LIDOCAINE-EPINEPHRINE 1 %-1:100000 IJ SOLN
INTRAMUSCULAR | Status: AC
  Filled 2024-05-11: qty 20

## 2024-05-11 MED ORDER — LIDOCAINE HCL 1 % IJ SOLN
20.0000 mL | Freq: Once | INTRAMUSCULAR | Status: AC
  Administered 2024-05-11: 10 mL via INTRADERMAL

## 2024-05-11 MED ORDER — ONDANSETRON HCL 4 MG/2ML IJ SOLN: 4.0000 mg | Freq: Four times a day (QID) | INTRAMUSCULAR | Status: DC | PRN

## 2024-05-11 MED ORDER — ACETAMINOPHEN 650 MG RE SUPP: 650.0000 mg | Freq: Four times a day (QID) | RECTAL | Status: DC | PRN

## 2024-05-11 NOTE — Progress Notes (Signed)
 Echo attempted, pt not in room  [facility] 05/11/2024 10:33 AM

## 2024-05-11 NOTE — Plan of Care (Signed)
 Patient alert and oriented X4, all admission questions answered. PIV intact, VSS, Tele on and functioning. Patient comfortably resting in bed, IVF ordered. Patient reports fainting, denies any falls. Bed alarm in place, bed at lowest position, family at the bedside. Patient instructed to use call bell before attempting to get up for safety precautions. Patient complaint and agreeable.  Problem: Respiratory: Goal: Ability to maintain adequate ventilation will improve Outcome: Progressing   Problem: Education: Goal: Knowledge of General Education information will improve Description: Including pain rating scale, medication(s)/side effects and non-pharmacologic comfort measures Outcome: Progressing   Problem: Health Behavior/Discharge Planning: Goal: Ability to manage health-related needs will improve Outcome: Progressing   Problem: Nutrition: Goal: Adequate nutrition will be maintained Outcome: Progressing   Problem: Elimination: Goal: Will not experience complications related to bowel motility Outcome: Progressing Goal: Will not experience complications related to urinary retention Outcome: Progressing   Problem: Pain Managment: Goal: General experience of comfort will improve and/or be controlled Outcome: Progressing

## 2024-05-11 NOTE — Progress Notes (Signed)
 Echocardiogram 2D Echocardiogram has been performed.  Sheri Foster 05/11/2024, 12:20 PM

## 2024-05-11 NOTE — Progress Notes (Signed)
 " Progress Note   Patient: Sheri Foster FMW:[id number] DOB: Dec 31, 1995 DOA: 05/10/2024     0 DOS: the patient was seen and examined on 05/11/2024   Brief hospital course: Sheri Foster is a 29 y.o. female with medical history significant of  hyperthyroidism presented with fever, neck lymphadenopathy, fatigue, decreased ability to ambulate since last 4 days.  Patient states she has been feeling weak since end of December where she went to ER and was treated for recommended spotted fever.  She has been feeling weak, endorses weight loss, has been feeling dizzy upon getting out of bed.  While PCP was performing right peritoneal lymph node exam she did feel dizzy lightheaded had seizure-like episode.  CBC June 7 showed smudge cells referred to emergency department for further management evaluation.   In the emergency department CT abdomen pelvis showed retroperitoneal lymph nodes, mild pelvic and iliac lymphadenopathy, 6.1 cm hypodense left adrenal mass likely fibroid, CTA chest showed no PE, cardiomegaly with small to moderate pericardial effusion, left-sided pleural effusion and partial subpleural left lower lobe consolidation, bilateral axillary and subpectoral adenopathy. Patient is currently being admitted to [facility] service for evaluation of diffuse lymphadenopathy, recommended, pneumonia, AKI, dehydration and syncope  Assessment and Plan: Sepsis possibly due to pneumonia: Presented with fever, tachycardia, AKI. Fever could also be a constitutional symptom, suspicion of lupus vs lymphoma. Antibiotics changed to IV Rocephin , azithromycin  therapy. Continue gentle IV fluids. Encourage incentive spirometry, out of bed to chair.  Acute kidney injury- In the setting of dehydration, poor oral intake, low blood pressures. Continue gentle IV fluids. Avoid nephrotoxic drugs.  Hyponatremia: Due to poor oral intake.  Gentle IV fluids.  Trend sodium  Syncope- Differential include vasovagal,  dehydration and hypotension. Continue IV fluids. Check orthostatic vitals. Echocardiogram pending.  Lymphadenopathy- Autoimmune panel positive for ANA, dsDNA, RNP, SSA Ro, La, smooth muscle, thyrotropin receptor. CT soft tissue neck, head, chest, abdomen reviewed- Multiple lymph node enlargements in the cervical, axillary, mediastinal, inguinal areas noted.  IR performed inguinal lymph node biopsy today. She did report joint pains, body aches, fatigue, rash. Discussed with oncology team who advised hematology input. Sent secure chat to Dr. Jeannetta consult rheumatologist, awaiting response.  Anemia- Hemoglobin 9.3, anemia panel reviewed shows low iron. Will give iron supplements.      Out of bed to chair. Incentive spirometry. Nursing supportive care. Fall, aspiration precautions. Diet:  Diet Orders (From admission, onward)     Start     Ordered   05/11/24 1113  Diet regular Room service appropriate? Yes; Fluid consistency: Thin  Diet effective now       Question Answer Comment  Room service appropriate? Yes   Fluid consistency: Thin      05/11/24 1112           DVT prophylaxis:   Level of care: Progressive   Code Status: Full Code  Subjective: Patient is seen and examined today morning.  She feels weak, has joint pains, body aches.  Was treated for RMSF 2 weeks ago.  Has been having fever night sweats since last 4 days.  Unable to get out of bed.  Physical Exam: Vitals:   05/11/24 0430 05/11/24 0445 05/11/24 0540 05/11/24 0944  BP: 102/74 97/75  113/82  Pulse: 72 73  84  Resp: (!) 21 (!) 24  (!) 23  Temp:   97.8 F (36.6 C) 98.2 F (36.8 C)  TempSrc:   Oral Oral  SpO2: 99% 98%  100%  General - Young beast African-American weak female, no apparent distress HEENT - PERRLA, EOMI, atraumatic head, right parotid/lymph node enlarged, tender Lung - Clear, basal rales, no rhonchi, wheezes. Heart - S1, S2 heard, no murmurs, rubs, no pedal edema. Abdomen - Soft,  non tender, obese, bowel sounds good Neuro - Alert, awake and oriented x 3, non focal exam. Skin - Warm and dry.  Data Reviewed:      Latest Ref Rng & Units 05/10/2024    6:41 PM 05/10/2024    4:28 PM 05/03/2024    9:35 AM  CBC  WBC 4.0 - 10.5 K/uL 6.3  5.0  4.3   Hemoglobin 12.0 - 15.0 g/dL 9.3  9.8  89.5   Hematocrit 36.0 - 46.0 % 29.3  29.9  32.0   Platelets 150 - 400 K/uL 270  242.0  319.0       Latest Ref Rng & Units 05/11/2024    4:53 AM 05/10/2024    6:41 PM 05/03/2024    9:35 AM  BMP  Glucose 70 - 99 mg/dL 888  874  83   BUN 6 - 20 mg/dL 13  21  12    Creatinine 0.44 - 1.00 mg/dL 8.80  8.51  9.11   Sodium 135 - 145 mmol/L 134  130  133   Potassium 3.5 - 5.1 mmol/L 5.1  4.4  3.6   Chloride 98 - 111 mmol/L 108  100  104   CO2 22 - 32 mmol/L 20  21  24    Calcium 8.9 - 10.3 mg/dL 7.1  7.9  8.8    CT ABDOMEN PELVIS W CONTRAST Result Date: 05/10/2024 CLINICAL DATA:  Flu like symptoms lymphadenopathy EXAM: CT ABDOMEN AND PELVIS WITH CONTRAST TECHNIQUE: Multidetector CT imaging of the abdomen and pelvis was performed using the standard protocol following bolus administration of intravenous contrast. RADIATION DOSE REDUCTION: This exam was performed according to the departmental dose-optimization program which includes automated exposure control, adjustment of the mA and/or kV according to patient size and/or use of iterative reconstruction technique. CONTRAST:  80mL OMNIPAQUE  IOHEXOL  350 MG/ML SOLN COMPARISON:  Ultrasound 02/04/2023 FINDINGS: Lower chest: See separately dictated chest CT. Hepatobiliary: No focal liver abnormality is seen. No gallstones, gallbladder wall thickening, or biliary dilatation. Pancreas: Unremarkable. No pancreatic ductal dilatation or surrounding inflammatory changes. Spleen: Upper normal in size at 13 cm Adrenals/Urinary Tract: Adrenal glands are unremarkable. Kidneys are normal, without renal calculi, focal lesion, or hydronephrosis. Bladder is unremarkable.  Stomach/Bowel: Stomach within normal limits. No dilated small bowel. Some fluid in the colon. No acute bowel wall thickening Vascular/Lymphatic: Nonaneurysmal aorta. Multiple subcentimeter retroperitoneal lymph nodes. Mildly enlarged left external iliac and pelvic lymph nodes. On the right, lymph nodes measure up to 14 mm and on the left measure up to 15 mm. Reproductive: Hypodense left uterine mass measuring 6.1 x 5.6 cm, probably a fibroid. No adnexal mass Other: No ascites.  No free air Musculoskeletal: No acute or suspicious osseous abnormality IMPRESSION: 1. No CT evidence for acute intra-abdominal or pelvic abnormality. 2. Multiple small retroperitoneal lymph nodes with mild pelvic and iliac adenopathy, these could be inflammatory or neoplastic in etiology. 3. 6.1 cm hypodense left uterine mass, probably a fibroid. Electronically Signed   By: Luke Bun M.D.   On: 05/10/2024 21:47   CT Soft Tissue Neck W Contrast Result Date: 05/10/2024 CLINICAL DATA:  Initial metastatic disease evaluation. EXAM: CT NECK WITH CONTRAST TECHNIQUE: Multidetector CT imaging of the neck was performed using the standard  protocol following the bolus administration of intravenous contrast. RADIATION DOSE REDUCTION: This exam was performed according to the departmental dose-optimization program which includes automated exposure control, adjustment of the mA and/or kV according to patient size and/or use of iterative reconstruction technique. CONTRAST:  80mL OMNIPAQUE  IOHEXOL  350 MG/ML SOLN COMPARISON:  None Available. FINDINGS: Pharynx and larynx: Oral cavity within normal limits. Palatine tonsils symmetric and within normal limits. Oropharynx and nasopharynx within normal limits. No retropharyngeal collection. Negative epiglottis. Hypopharynx, supraglottic larynx, and glottis within normal limits. Subglottic airway clear. Salivary glands: There is an area of enhancement measuring approximately 2.1 cm at the posterior aspect of  the right parotid gland (series 3, image 28). Superimposed 9 mm hypodensity at this location. Contralateral left parotid gland within normal limits. Submandibular glands within normal limits. Thyroid : Normal. Lymph nodes: Increased number of subcentimeter lymph nodes seen extending along the cervical chains bilaterally. No overtly pathologically enlarged lymph nodes. One of these nodes along the left posterior cervical chain measuring 1 cm in short access demonstrates internal hypodensity, consistent with suppuration and/or necrosis (series 3, image 28). Prominent bilateral axillary lymph nodes noted as well, largest of which measures 1.1 cm on the right (series 3, image 97). Vascular: Normal intravascular enhancement seen within the neck. Limited intracranial: Unremarkable. Visualized orbits: Unremarkable. Mastoids and visualized paranasal sinuses: Visualized paranasal sinuses are largely clear. Mastoid air cells and middle ear cavities are largely clear as well. Skeleton: No worrisome osseous lesions. Upper chest: Better evaluated on dedicated CT of the chest performed at the same time. Small layering left pleural effusion partially visualized. Pericardial effusion partially visualized as well. Other: None. IMPRESSION: 1. Increased number of subcentimeter lymph nodes extending along the cervical chains bilaterally with prominent axillary adenopathy. 1 cm left posterior cervical chain node demonstrates internal hypodensity, consistent with suppuration and/or necrosis. Findings are nonspecific, and could be reactive in nature. Possible nodal metastatic disease or lymphoproliferative disorder would be the primary differential considerations. 2. 2.1 cm area of enhancement with central hypodensity at the posterior aspect of the right parotid gland. Finding is indeterminate, and could reflect an intraparotid lymph node with surrounding enhancement or possibly primary salivary neoplasm. No other overt changes of acute  parotitis. 3. Small layering left pleural effusion and pericardial effusion, better evaluated on dedicated CT of the chest performed at the same time. Electronically Signed   By: Morene Hoard M.D.   On: 05/10/2024 21:46   CT Angio Chest PE W and/or Wo Contrast Result Date: 05/10/2024 CLINICAL DATA:  Syncope flu like symptoms EXAM: CT ANGIOGRAPHY CHEST WITH CONTRAST TECHNIQUE: Multidetector CT imaging of the chest was performed using the standard protocol during bolus administration of intravenous contrast. Multiplanar CT image reconstructions and MIPs were obtained to evaluate the vascular anatomy. RADIATION DOSE REDUCTION: This exam was performed according to the departmental dose-optimization program which includes automated exposure control, adjustment of the mA and/or kV according to patient size and/or use of iterative reconstruction technique. CONTRAST:  80mL OMNIPAQUE  IOHEXOL  350 MG/ML SOLN COMPARISON:  Chest x-ray 05/10/2024 FINDINGS: Cardiovascular: Satisfactory opacification of the pulmonary arteries to the segmental level. No evidence of pulmonary embolism. Nonaneurysmal aorta. No dissection. Cardiomegaly. Small moderate pericardial effusion, measures 13 mm maximum thickness on the left side. Mediastinum/Nodes: Patent trachea. No thyroid  mass. Mild bilateral axillary adenopathy. Lymph nodes on the right measuring up to 15 mm. Lymph nodes on the left measuring up to 13 mm. Multiple mildly enlarged subpectoral nodes. Multiple small bilateral subcentimeter supraclavicular nodes. Incompletely visualized small  nodules posterior to the right shoulder measuring up to 10 mm on series 5, image 28. Right paratracheal node measuring up to 10 mm. No significantly enlarged hilar nodes. Lungs/Pleura: Small left-sided pleural effusion. Partial subpleural left lower lobe consolidation. Atelectasis in the lingula. Upper Abdomen: See separately dictated CT Musculoskeletal: No acute osseous abnormality. Review of  the MIP images confirms the above findings. IMPRESSION: 1. Negative for acute pulmonary embolus or aortic dissection. 2. Cardiomegaly with small to moderate pericardial effusion. 3. Small left-sided pleural effusion with partial subpleural left lower lobe consolidation, atelectasis versus pneumonia. 4. Mild bilateral axillary and subpectoral adenopathy, indeterminate for reactive versus inflammatory versus neoplastic process. Incompletely visualized small soft tissue nodules posterior to the right shoulder measuring up to 10 mm. Electronically Signed   By: Luke Bun M.D.   On: 05/10/2024 21:37   CT Head Wo Contrast Addendum Date: 05/10/2024 ADDENDUM REPORT: 05/10/2024 21:34 ADDENDUM: Small left occipital soft tissue nodule measuring 5 mm, likely a lymph node. Multiple incompletely visualized Peri auricular and parotid nodules, measuring up to 7 mm on the left and 6 mm on the right, potentially lymph nodes. Electronically Signed   By: Luke Bun M.D.   On: 05/10/2024 21:34   Result Date: 05/10/2024 CLINICAL DATA:  Syncopal episodes EXAM: CT HEAD WITHOUT CONTRAST TECHNIQUE: Contiguous axial images were obtained from the base of the skull through the vertex without intravenous contrast. RADIATION DOSE REDUCTION: This exam was performed according to the departmental dose-optimization program which includes automated exposure control, adjustment of the mA and/or kV according to patient size and/or use of iterative reconstruction technique. COMPARISON:  CT brain 04/04/2024 FINDINGS: Brain: No evidence of acute infarction, hemorrhage, hydrocephalus, extra-axial collection or mass lesion/mass effect. Vascular: No hyperdense vessel or unexpected calcification. Skull: Normal. Negative for fracture or focal lesion. Sinuses/Orbits: No acute finding. Other: None IMPRESSION: Negative non contrasted CT appearance of the brain. Electronically Signed: By: Luke Bun M.D. On: 05/10/2024 21:23   DG Chest 2  View Result Date: 05/10/2024 EXAM: 2 VIEW(S) XRAY OF THE CHEST 05/10/2024 03:46:31 PM COMPARISON: 03/15/2024 CLINICAL HISTORY: cough, rash, jt pain, fatigue FINDINGS: LUNGS AND PLEURA: Low lung volumes with increased pulmonary vascularity. Subsegmental atelectasis in left mid lung. Small bilateral pleural effusions with overlying subsegmental atelectasis. No pneumothorax. HEART AND MEDIASTINUM: No acute abnormality of the cardiac and mediastinal silhouettes. BONES AND SOFT TISSUES: No acute osseous abnormality. IMPRESSION: 1. Low lung volumes with increased pulmonary vascularity. 2. Small bilateral pleural effusions with overlying atelectasis. Electronically signed by: Waddell Calk MD 05/10/2024 03:55 PM EST RP Workstation: [id number]    Family Communication: Discussed with patient, mother, understand and agree. All questions answered.  Disposition: Status is: Observation The patient will require care spanning > 2 midnights and should be moved to inpatient because: Fever, lymphadenopathy workup, IV antibiotics, IV hydration, lymph node biopsy, PT OT  Planned Discharge Destination: Home     Time spent: 52 minutes  Author: Concepcion Riser, MD 05/11/2024 11:26 AM Secure chat 7am to 7pm For on call review [email or web address].    "

## 2024-05-11 NOTE — Consult Note (Addendum)
  [facility] Center CONSULT NOTE  Addendum  HPI Patient was seen and examined independently.  29 year old pleasant female without profound medical history presented with acute onset of fever, lymphadenopathy.  Patient reports she has not felt well for over the last few months.  Patient reports started not feel well about November.  At that time she presented with chest pain.  Reported it was substernal.  Pain was worse on deep breathing.  This also caused her to have sensation of cough.  She was told could have GERD.  Medication did not help.  Over the next week, she started having mouth sores, skin rash.  She presented to the emergency room.  She was told to have [facility] spotted fever.  She denies any history of tick bite, or hiking recently even back to the summer.  She was given doxycycline .  At that time she had intermittent diffuse joint pain.  This is improved.  Over the last week, she developed sudden onset of lymphadenopathy over the right ear area.  It was larger and tender.  This has become smaller.  However, she has worsening weakness and report she has a hard time getting up and walk.  Due to the new onset of fever, leg swelling, she presented back to the ED for evaluation.  Labs showed acute kidney injury with elevated creatinine, mild hyperglycemia, anemia inflammation, microcytic anemia, elevated ESR and CRP.  dsDNA was greater than 300, and positive Smith antibody and ENA.  LFT was normal.  Imaging showed diffuse small lymphadenopathy above and below the diaphragm.  She underwent biopsy of right distal iliac lymph node today.  GENERAL: alert, no distress and comfortable SKIN: skin color normal and no clear rash on exposed skin EYES: sclera clear OROPHARYNX: no exudate and no mouth sores NECK: Tender right preauricular lymph node.  Small bilateral mobile cervical nodes. LYMPH: Small bilateral cervical lymphadenopathy  LUNGS: clear to auscultation and percussion with  normal breathing effort HEART: regular rate & rhythm  ABDOMEN: abdomen soft, non-tender and nondistended. Musculoskeletal: Bilateral ankle nonpitting edema NEURO: no focal motor/sensory deficits  Assessment and plan 29 year old female previously healthy presented with diffuse lymphadenopathy, including tender preauricular small lymph node, pleuritic discomfort, generalized weakness, generalized arthritis, recently resolved ulcers and rash.  Discussed differential diagnosis with the patient.  Infectious, autoimmune, neoplastic can also present similar findings.  Given her positive ANA, dsDNA along with other autoantibodies, suspect undiagnosed lupus could be the cause.  She also has signs of acute kidney injury, will need to increase fluid intake.  We will obtain EBV as well.  Pending lymph node biopsy pathology results.  Will follow-up again.  Follow-up with lymph node biopsy Follow-up EBV status Recommend rheumatology consult Fluid goal 64 ounce plus a day.  Monitor kidney function for worsening Thank you for the consult, will follow with you.   Patient Care Team: Sheri Clotilda SAUNDERS, MD as PCP - General (Family Medicine)  CHIEF COMPLAINTS/PURPOSE OF CONSULTATION:  Fever, fatigue, CT head/ chest/ abdomen shows lymphadenopathy. she had inguinal lymph node biopsied today with IR.   REFERRING PHYSICIAN: Dr. Darci  HISTORY OF PRESENTING ILLNESS:  Sheri Foster 29 y.o. female who presented 05/10/2024 with complaints of fever, progressive weakness with inability to ambulate for approximately 4 days. Workup done in the ED including imaging which showed axillary and subpectoral adenopathy concerning for reactive or inflammatory or neoplastic process.  Lymph node biopsy by IR done today.  Oncology evaluation has been requested. Patient is seen awake  alert and oriented x 3 laying on stretcher in the ED.  She is a very pleasant young lady who details her history.  States that she developed a fever  for about 4 days which would occur mostly at night.  Also notes cough, shortness of breath and generalized bodyaches.  Of note she states that she has had a rash on her hands and feet with some swelling for several months.  Has been evaluated by PCP and all test have been negative.  She did receive antibiotics with no resolution of the symptoms.  Medical history is denied by patient.  However noted in documentation that she has a history of thyroid  disease, depression, migraines. Surgical history includes dental extraction of wisdom teeth, no other surgical intervention. Family oncologic or hematologic history is negative.. Social history is noncontributory.  Denies tobacco use, denies alcohol use, denies recreational or illicit drug use.  Admits to smoking marijuana for short period in order to cope, however stopped 2 to 3 years ago.  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  Lives with best friend.    I have reviewed her chart and materials related to her cancer extensively and collaborated history with the patient. Summary of oncologic history is as follows: Oncology History   No problem history exists.    ASSESSMENT & PLAN:  Lymphadenopathy, retroperitoneal, pelvic/iliac, axillary, subpectoral, with periauricular and parotid nodules - Imaging abdomen pelvis done 05/10/2024 shows multiple small retroperitoneal lymph nodes with pelvic and iliac adenopathy, inflammatory or neoplastic etiology.  Also chest imaging shows bilateral axillary and subpectoral adenopathy.  CT head showed multiple periauricular and parotid nodules. - Status post ultrasound-guided right iliac lymph node biopsy done today 05/11/2024.  Will follow results of pathology. - Medical oncology/Dr. Tina following and will make further recommendations.  Sepsis/fevers - Patient complains of 4 days of fevers especially at night. - Elevated temp 102.7 within the last 24 hours.  Most recent  temp 98.2. - IV antibiotics: Agree with Maxipime , Flagyl , Vanco as ordered - Monitor fever curve  Anemia, normocytic - Hemoglobin 9.3 - No transfusional intervention required at this time - Continue to monitor CBC with differential  AKI - Elevated creatinine 1.48 on admission with elevated BUN 21.  Improving today BUN WNL and creatinine 1.19 - Avoid nephrotoxic agents - Continue to monitor renal function  Joint pain with bodyaches Rash - Unclear etiology - Patient reports previously receiving course of doxycycline  with no resolution   MEDICAL HISTORY:  Past Medical History:  Diagnosis Date   Depression    Migraines    Thyroid  disease 2019    SURGICAL HISTORY: Past Surgical History:  Procedure Laterality Date   IR RADIOLOGIST EVAL & MGMT  05/11/2024    SOCIAL HISTORY: Social History   Socioeconomic History   Marital status: Single    Spouse name: Not on file   Number of children: Not on file   Years of education: Not on file   Highest education level: Associate degree: occupational, scientist, product/process development, or vocational program  Occupational History   Not on file  Tobacco Use   Smoking status: Never   Smokeless tobacco: Never  Substance and Sexual Activity   Alcohol use: Yes   Drug use: Never   Sexual activity: Not Currently  Other Topics Concern   Not on file  Social History Narrative   Not on file   Social Drivers of Health   Tobacco Use: Low Risk (05/10/2024)   Patient History  Smoking Tobacco Use: Never    Smokeless Tobacco Use: Never    Passive Exposure: Not on file  Financial Resource Strain: Low Risk (03/02/2024)   Overall Financial Resource Strain (CARDIA)    Difficulty of Paying Living Expenses: Not hard at all  Food Insecurity: No Food Insecurity (03/02/2024)   Epic    Worried About Programme Researcher, Broadcasting/film/video in the Last Year: Never true    Ran Out of Food in the Last Year: Never true  Transportation Needs: No Transportation Needs (03/02/2024)   Epic    Lack  of Transportation (Medical): No    Lack of Transportation (Non-Medical): No  Physical Activity: Insufficiently Active (03/02/2024)   Exercise Vital Sign    Days of Exercise per Week: 2 days    Minutes of Exercise per Session: 30 min  Stress: No Stress Concern Present (03/02/2024)   [employer] of Occupational Health - Occupational Stress Questionnaire    Feeling of Stress: Only a little  Social Connections: Moderately Isolated (03/02/2024)   Social Connection and Isolation Panel    Frequency of Communication with Friends and Family: More than three times a week    Frequency of Social Gatherings with Friends and Family: More than three times a week    Attends Religious Services: More than 4 times per year    Active Member of [employer] or Organizations: No    Attends Engineer, Structural: Not on file    Marital Status: Never married  Intimate Partner Violence: Unknown (06/01/2022)   Received from [facility]   HITS    Physically Hurt: Not on file    Insult or Talk Down To: Not on file    Threaten Physical Harm: Not on file    Scream or Curse: Not on file  Depression (PHQ2-9): Low Risk (05/03/2024)   Depression (PHQ2-9)    PHQ-2 Score: 0  Alcohol Screen: Low Risk (03/02/2024)   Alcohol Screen    Last Alcohol Screening Score (AUDIT): 1  Housing: Low Risk (03/02/2024)   Epic    Unable to Pay for Housing in the Last Year: No    Number of Times Moved in the Last Year: 0    Homeless in the Last Year: No  Utilities: Not on file  Health Literacy: Not on file    FAMILY HISTORY: Family History  Problem Relation Age of Onset   Asthma Mother    Hyperlipidemia Mother    Hypertension Mother    Hyperthyroidism Mother    Hypothyroidism Other      PHYSICAL EXAMINATION: ECOG PERFORMANCE STATUS: 1 - Symptomatic but completely ambulatory  Vitals:   05/11/24 0540 05/11/24 0944  BP:  113/82  Pulse:  84  Resp:  (!) 23  Temp: 97.8 F (36.6 C) 98.2 F (36.8 C)  SpO2:  100%    There were no vitals filed for this visit.  GENERAL: alert, no distress and comfortable SKIN: skin color, texture, turgor are normal, no rashes or significant lesions EYES: normal, conjunctiva are pink and non-injected, sclera clear OROPHARYNX: no exudate, no erythema and lips, buccal mucosa, and tongue normal  NECK: supple, thyroid  normal size, non-tender, without nodularity LYMPH: no palpable lymphadenopathy in the cervical, axillary or inguinal LUNGS: clear to auscultation and percussion with normal breathing effort HEART: regular rate & rhythm and no murmurs and no lower extremity edema ABDOMEN: abdomen soft, non-tender and normal bowel sounds MUSCULOSKELETAL: no cyanosis of digits and no clubbing  PSYCH: alert & oriented x 3 with fluent  speech NEURO: no focal motor/sensory deficits   ALLERGIES:  has no known allergies.  MEDICATIONS:  Current Facility-Administered Medications  Medication Dose Route Frequency Provider Last Rate Last Admin   0.9 %  sodium chloride  infusion  150 mL/hr Intravenous Continuous Doutova, Anastassia, MD 150 mL/hr at 05/10/24 2135 150 mL/hr at 05/10/24 2135   ceFEPIme  (MAXIPIME ) 2 g in sodium chloride  0.9 % 100 mL IVPB  2 g Intravenous Q12H Poindexter, Leann T, RPH 200 mL/hr at 05/11/24 1103 2 g at 05/11/24 1103   metroNIDAZOLE  (FLAGYL ) IVPB 500 mg  500 mg Intravenous Q12H Doutova, Anastassia, MD 100 mL/hr at 05/11/24 1101 500 mg at 05/11/24 1101   vancomycin  (VANCOREADY) IVPB 1500 mg/300 mL  1,500 mg Intravenous Q24H Poindexter, Leann T, RPH       Current Outpatient Medications  Medication Sig Dispense Refill   acetaminophen  (TYLENOL ) 500 MG tablet Take 500 mg by mouth every 6 (six) hours as needed.     cyclobenzaprine  (FLEXERIL ) 10 MG tablet Take 1 tablet (10 mg total) by mouth 2 (two) times daily as needed for muscle spasms. 30 tablet 2   Ubrogepant  (UBRELVY ) 50 MG TABS Take 1 tablet (50 mg) at start of migraine.  May repeat dose in 2 hours if headache  persists or recurs. 30 tablet 3     LABORATORY DATA:  I have reviewed the data as listed Lab Results  Component Value Date   WBC 6.3 05/10/2024   HGB 9.3 (L) 05/10/2024   HCT 29.3 (L) 05/10/2024   MCV 82.1 05/10/2024   PLT 270 05/10/2024   Recent Labs    04/26/24 0012 05/03/24 0935 05/10/24 1841 05/11/24 0453  NA 134* 133* 130* 134*  K 4.0 3.6 4.4 5.1  CL 101 104 100 108  CO2 23 24 21* 20*  GLUCOSE 85 83 125* 111*  BUN 15 12 21* 13  CREATININE 0.74 0.88 1.48* 1.19*  CALCIUM 8.9 8.8 7.9* 7.1*  GFRNONAA >60  --  48* >60  PROT 8.9* 8.7* 7.9  --   ALBUMIN 3.3* 3.1* 2.7*  --   AST 33 22 37  --   ALT 19 11 13   --   ALKPHOS 39 32* 21*  --   BILITOT 0.7 0.5 0.6  --     RADIOGRAPHIC STUDIES: I have personally reviewed the radiological images as listed and agreed with the findings in the report. IR Radiologist Eval & Mgmt Result Date: 05/11/2024 INDICATION: 29 year old female with history of indeterminate lymphadenopathy. EXAM: Ultrasound-guided right iliac lymph node biopsy. MEDICATIONS: None. ANESTHESIA/SEDATION: None. FLUOROSCOPY TIME:  None. COMPLICATIONS: None immediate. PROCEDURE: Informed written consent was obtained from the patient after a thorough discussion of the procedural risks, benefits and alternatives. All questions were addressed. Maximal Sterile Barrier Technique was utilized including caps, mask, sterile gowns, sterile gloves, sterile drape, hand hygiene and skin antiseptic. A timeout was performed prior to the initiation of the procedure. Preprocedure ultrasound evaluation of the right groin demonstrated prominent distal iliac lymphadenopathy. The procedure was planned. Subdermal Local anesthesia was administered at the planned needle entry site with 1% lidocaine . Deeper local anesthetic was administered to the periphery of the lymph node under ultrasound visualization. Next, a 17 gauge coaxial introducer needle was directed to the periphery of the lymph node. This  followed by acquisition of 4, 18 gauge core biopsy samples. The samples were split between formalin and saline. The needle was removed. Postprocedure imaging demonstrated no evidence of surrounding hematoma or other complicating features. A sterile  bandage was applied. The patient tolerated the procedure well and was transferred back to floor in good condition. IMPRESSION: Technically successful ultrasound-guided right iliac lymph node biopsy. Ester Sides, MD [facility] Electronically Signed   By: Ester Sides M.D.   On: 05/11/2024 11:32   CT ABDOMEN PELVIS W CONTRAST Result Date: 05/10/2024 CLINICAL DATA:  Flu like symptoms lymphadenopathy EXAM: CT ABDOMEN AND PELVIS WITH CONTRAST TECHNIQUE: Multidetector CT imaging of the abdomen and pelvis was performed using the standard protocol following bolus administration of intravenous contrast. RADIATION DOSE REDUCTION: This exam was performed according to the departmental dose-optimization program which includes automated exposure control, adjustment of the mA and/or kV according to patient size and/or use of iterative reconstruction technique. CONTRAST:  80mL OMNIPAQUE  IOHEXOL  350 MG/ML SOLN COMPARISON:  Ultrasound 02/04/2023 FINDINGS: Lower chest: See separately dictated chest CT. Hepatobiliary: No focal liver abnormality is seen. No gallstones, gallbladder wall thickening, or biliary dilatation. Pancreas: Unremarkable. No pancreatic ductal dilatation or surrounding inflammatory changes. Spleen: Upper normal in size at 13 cm Adrenals/Urinary Tract: Adrenal glands are unremarkable. Kidneys are normal, without renal calculi, focal lesion, or hydronephrosis. Bladder is unremarkable. Stomach/Bowel: Stomach within normal limits. No dilated small bowel. Some fluid in the colon. No acute bowel wall thickening Vascular/Lymphatic: Nonaneurysmal aorta. Multiple subcentimeter retroperitoneal lymph nodes. Mildly  enlarged left external iliac and pelvic lymph nodes. On the right, lymph nodes measure up to 14 mm and on the left measure up to 15 mm. Reproductive: Hypodense left uterine mass measuring 6.1 x 5.6 cm, probably a fibroid. No adnexal mass Other: No ascites.  No free air Musculoskeletal: No acute or suspicious osseous abnormality IMPRESSION: 1. No CT evidence for acute intra-abdominal or pelvic abnormality. 2. Multiple small retroperitoneal lymph nodes with mild pelvic and iliac adenopathy, these could be inflammatory or neoplastic in etiology. 3. 6.1 cm hypodense left uterine mass, probably a fibroid. Electronically Signed   By: Luke Bun M.D.   On: 05/10/2024 21:47   CT Soft Tissue Neck W Contrast Result Date: 05/10/2024 CLINICAL DATA:  Initial metastatic disease evaluation. EXAM: CT NECK WITH CONTRAST TECHNIQUE: Multidetector CT imaging of the neck was performed using the standard protocol following the bolus administration of intravenous contrast. RADIATION DOSE REDUCTION: This exam was performed according to the departmental dose-optimization program which includes automated exposure control, adjustment of the mA and/or kV according to patient size and/or use of iterative reconstruction technique. CONTRAST:  80mL OMNIPAQUE  IOHEXOL  350 MG/ML SOLN COMPARISON:  None Available. FINDINGS: Pharynx and larynx: Oral cavity within normal limits. Palatine tonsils symmetric and within normal limits. Oropharynx and nasopharynx within normal limits. No retropharyngeal collection. Negative epiglottis. Hypopharynx, supraglottic larynx, and glottis within normal limits. Subglottic airway clear. Salivary glands: There is an area of enhancement measuring approximately 2.1 cm at the posterior aspect of the right parotid gland (series 3, image 28). Superimposed 9 mm hypodensity at this location. Contralateral left parotid gland within normal limits. Submandibular glands within normal limits. Thyroid : Normal. Lymph nodes:  Increased number of subcentimeter lymph nodes seen extending along the cervical chains bilaterally. No overtly pathologically enlarged lymph nodes. One of these nodes along the left posterior cervical chain measuring 1 cm in short access demonstrates internal hypodensity, consistent with suppuration and/or necrosis (series 3, image 28). Prominent bilateral axillary lymph nodes noted as well, largest of which measures 1.1 cm on the right (series 3, image 97). Vascular: Normal intravascular enhancement seen within the neck. Limited intracranial: Unremarkable. Visualized orbits:  Unremarkable. Mastoids and visualized paranasal sinuses: Visualized paranasal sinuses are largely clear. Mastoid air cells and middle ear cavities are largely clear as well. Skeleton: No worrisome osseous lesions. Upper chest: Better evaluated on dedicated CT of the chest performed at the same time. Small layering left pleural effusion partially visualized. Pericardial effusion partially visualized as well. Other: None. IMPRESSION: 1. Increased number of subcentimeter lymph nodes extending along the cervical chains bilaterally with prominent axillary adenopathy. 1 cm left posterior cervical chain node demonstrates internal hypodensity, consistent with suppuration and/or necrosis. Findings are nonspecific, and could be reactive in nature. Possible nodal metastatic disease or lymphoproliferative disorder would be the primary differential considerations. 2. 2.1 cm area of enhancement with central hypodensity at the posterior aspect of the right parotid gland. Finding is indeterminate, and could reflect an intraparotid lymph node with surrounding enhancement or possibly primary salivary neoplasm. No other overt changes of acute parotitis. 3. Small layering left pleural effusion and pericardial effusion, better evaluated on dedicated CT of the chest performed at the same time. Electronically Signed   By: Morene Hoard M.D.   On: 05/10/2024  21:46   CT Angio Chest PE W and/or Wo Contrast Result Date: 05/10/2024 CLINICAL DATA:  Syncope flu like symptoms EXAM: CT ANGIOGRAPHY CHEST WITH CONTRAST TECHNIQUE: Multidetector CT imaging of the chest was performed using the standard protocol during bolus administration of intravenous contrast. Multiplanar CT image reconstructions and MIPs were obtained to evaluate the vascular anatomy. RADIATION DOSE REDUCTION: This exam was performed according to the departmental dose-optimization program which includes automated exposure control, adjustment of the mA and/or kV according to patient size and/or use of iterative reconstruction technique. CONTRAST:  80mL OMNIPAQUE  IOHEXOL  350 MG/ML SOLN COMPARISON:  Chest x-ray 05/10/2024 FINDINGS: Cardiovascular: Satisfactory opacification of the pulmonary arteries to the segmental level. No evidence of pulmonary embolism. Nonaneurysmal aorta. No dissection. Cardiomegaly. Small moderate pericardial effusion, measures 13 mm maximum thickness on the left side. Mediastinum/Nodes: Patent trachea. No thyroid  mass. Mild bilateral axillary adenopathy. Lymph nodes on the right measuring up to 15 mm. Lymph nodes on the left measuring up to 13 mm. Multiple mildly enlarged subpectoral nodes. Multiple small bilateral subcentimeter supraclavicular nodes. Incompletely visualized small nodules posterior to the right shoulder measuring up to 10 mm on series 5, image 28. Right paratracheal node measuring up to 10 mm. No significantly enlarged hilar nodes. Lungs/Pleura: Small left-sided pleural effusion. Partial subpleural left lower lobe consolidation. Atelectasis in the lingula. Upper Abdomen: See separately dictated CT Musculoskeletal: No acute osseous abnormality. Review of the MIP images confirms the above findings. IMPRESSION: 1. Negative for acute pulmonary embolus or aortic dissection. 2. Cardiomegaly with small to moderate pericardial effusion. 3. Small left-sided pleural effusion with  partial subpleural left lower lobe consolidation, atelectasis versus pneumonia. 4. Mild bilateral axillary and subpectoral adenopathy, indeterminate for reactive versus inflammatory versus neoplastic process. Incompletely visualized small soft tissue nodules posterior to the right shoulder measuring up to 10 mm. Electronically Signed   By: Luke Bun M.D.   On: 05/10/2024 21:37   CT Head Wo Contrast Addendum Date: 05/10/2024 ADDENDUM REPORT: 05/10/2024 21:34 ADDENDUM: Small left occipital soft tissue nodule measuring 5 mm, likely a lymph node. Multiple incompletely visualized Peri auricular and parotid nodules, measuring up to 7 mm on the left and 6 mm on the right, potentially lymph nodes. Electronically Signed   By: Luke Bun M.D.   On: 05/10/2024 21:34   Result Date: 05/10/2024 CLINICAL DATA:  Syncopal episodes EXAM: CT HEAD  WITHOUT CONTRAST TECHNIQUE: Contiguous axial images were obtained from the base of the skull through the vertex without intravenous contrast. RADIATION DOSE REDUCTION: This exam was performed according to the departmental dose-optimization program which includes automated exposure control, adjustment of the mA and/or kV according to patient size and/or use of iterative reconstruction technique. COMPARISON:  CT brain 04/04/2024 FINDINGS: Brain: No evidence of acute infarction, hemorrhage, hydrocephalus, extra-axial collection or mass lesion/mass effect. Vascular: No hyperdense vessel or unexpected calcification. Skull: Normal. Negative for fracture or focal lesion. Sinuses/Orbits: No acute finding. Other: None IMPRESSION: Negative non contrasted CT appearance of the brain. Electronically Signed: By: Luke Bun M.D. On: 05/10/2024 21:23   DG Chest 2 View Result Date: 05/10/2024 EXAM: 2 VIEW(S) XRAY OF THE CHEST 05/10/2024 03:46:31 PM COMPARISON: 03/15/2024 CLINICAL HISTORY: cough, rash, jt pain, fatigue FINDINGS: LUNGS AND PLEURA: Low lung volumes with increased pulmonary  vascularity. Subsegmental atelectasis in left mid lung. Small bilateral pleural effusions with overlying subsegmental atelectasis. No pneumothorax. HEART AND MEDIASTINUM: No acute abnormality of the cardiac and mediastinal silhouettes. BONES AND SOFT TISSUES: No acute osseous abnormality. IMPRESSION: 1. Low lung volumes with increased pulmonary vascularity. 2. Small bilateral pleural effusions with overlying atelectasis. Electronically signed by: Waddell Calk MD 05/10/2024 03:55 PM EST RP Workstation: [id number]     I personally spent a total of 55 minutes minutes in the care of the patient today including preparing to see the patient, getting/reviewing separately obtained history, performing a medically appropriate exam/evaluation, referring and communicating with other health care professionals, documenting clinical information in the EHR, communicating results, and coordinating care.    All questions were answered. The patient knows to call the clinic with any problems, questions or concerns. No barriers to learning was detected.  Olam JINNY Brunner, NP 1/15/202611:43 AM

## 2024-05-11 NOTE — Procedures (Signed)
 Interventional Radiology Procedure Note  Procedure: Ultrasound guided right iliac lymph node biopsy   Findings: Please refer to procedural dictation for full description. 18 ga core x4 from right distal iliac lymph node.  Complications: None immediate  Estimated Blood Loss:  <5  mL  Recommendations: Follow Pathology results.   Ester Sides, MD

## 2024-05-11 NOTE — Plan of Care (Signed)
  Problem: Clinical Measurements: Goal: Will remain free from infection Outcome: Progressing Goal: Diagnostic test results will improve Outcome: Progressing   Problem: Activity: Goal: Risk for activity intolerance will decrease Outcome: Progressing   Problem: Elimination: Goal: Will not experience complications related to bowel motility Outcome: Progressing Goal: Will not experience complications related to urinary retention Outcome: Progressing   Problem: Pain Managment: Goal: General experience of comfort will improve and/or be controlled Outcome: Progressing   Problem: Safety: Goal: Ability to remain free from injury will improve Outcome: Progressing

## 2024-05-12 ENCOUNTER — Ambulatory Visit: Payer: Self-pay | Admitting: Family Medicine

## 2024-05-12 DIAGNOSIS — N179 Acute kidney failure, unspecified: Secondary | ICD-10-CM | POA: Diagnosis not present

## 2024-05-12 DIAGNOSIS — J189 Pneumonia, unspecified organism: Secondary | ICD-10-CM | POA: Diagnosis not present

## 2024-05-12 DIAGNOSIS — E0590 Thyrotoxicosis, unspecified without thyrotoxic crisis or storm: Secondary | ICD-10-CM | POA: Diagnosis not present

## 2024-05-12 DIAGNOSIS — R55 Syncope and collapse: Secondary | ICD-10-CM | POA: Diagnosis not present

## 2024-05-12 LAB — ENA+DNA/DS+ANTICH+CENTRO+JO...
Anti JO-1: <0.2 AI (ref 0.0–0.9)
Centromere Ab Screen: <0.2 AI (ref 0.0–0.9)
Chromatin Ab SerPl-aCnc: >8 AI — ABNORMAL HIGH (ref 0.0–0.9)
ENA SM Ab Ser-aCnc: >8 AI — ABNORMAL HIGH (ref 0.0–0.9)
Ribonucleic Protein: >8 AI — ABNORMAL HIGH (ref 0.0–0.9)
SSA (Ro) (ENA) Antibody, IgG: >8 AI — ABNORMAL HIGH (ref 0.0–0.9)
SSB (La) (ENA) Antibody, IgG: 4 AI — ABNORMAL HIGH (ref 0.0–0.9)
Scleroderma (Scl-70) (ENA) Antibody, IgG: 0.4 AI (ref 0.0–0.9)
ds DNA Ab: >300 [IU]/mL — ABNORMAL HIGH (ref 0–9)

## 2024-05-12 LAB — BASIC METABOLIC PANEL WITH GFR
Anion gap: 7 (ref 5–15)
BUN: 17 mg/dL (ref 6–20)
CO2: 19 mmol/L — ABNORMAL LOW (ref 22–32)
Calcium: 7.3 mg/dL — ABNORMAL LOW (ref 8.9–10.3)
Chloride: 109 mmol/L (ref 98–111)
Creatinine, Ser: 0.88 mg/dL (ref 0.44–1.00)
GFR, Estimated: >60 mL/min
Glucose, Bld: 92 mg/dL (ref 70–99)
Potassium: 4 mmol/L (ref 3.5–5.1)
Sodium: 135 mmol/L (ref 135–145)

## 2024-05-12 LAB — CBC
HCT: 23.8 % — ABNORMAL LOW (ref 36.0–46.0)
Hemoglobin: 7.7 g/dL — ABNORMAL LOW (ref 12.0–15.0)
MCH: 26.2 pg (ref 26.0–34.0)
MCHC: 32.4 g/dL (ref 30.0–36.0)
MCV: 81 fL (ref 80.0–100.0)
Platelets: 200 K/uL (ref 150–400)
RBC: 2.94 MIL/uL — ABNORMAL LOW (ref 3.87–5.11)
RDW: 14.6 % (ref 11.5–15.5)
WBC: 3.4 K/uL — ABNORMAL LOW (ref 4.0–10.5)
nRBC: 0 % (ref 0.0–0.2)

## 2024-05-12 LAB — EBV AB TO VIRAL CAPSID AG PNL, IGG+IGM
EBV VCA IgG: 323 U/mL — ABNORMAL HIGH (ref 0.0–17.9)
EBV VCA IgM: <36 U/mL (ref 0.0–35.9)

## 2024-05-12 LAB — MAGNESIUM: Magnesium: 2.1 mg/dL (ref 1.7–2.4)

## 2024-05-12 LAB — PHOSPHORUS: Phosphorus: 2.6 mg/dL (ref 2.5–4.6)

## 2024-05-12 LAB — LEGIONELLA PNEUMOPHILA SEROGP 1 UR AG: L. pneumophila Serogp 1 Ur Ag: NEGATIVE

## 2024-05-12 LAB — ANA W/REFLEX IF POSITIVE: Anti Nuclear Antibody (ANA): POSITIVE — AB

## 2024-05-12 LAB — EPSTEIN-BARR VIRUS NUCLEAR ANTIGEN ANTIBODY, IGG: EBV NA IgG: >600 U/mL — ABNORMAL HIGH (ref 0.0–17.9)

## 2024-05-12 MED ORDER — FERROUS SULFATE 325 (65 FE) MG PO TABS
325.0000 mg | ORAL_TABLET | Freq: Every day | ORAL | Status: DC
  Administered 2024-05-12 – 2024-05-15 (×4): 325 mg via ORAL
  Filled 2024-05-12 (×4): qty 1

## 2024-05-12 MED ORDER — METHYLPREDNISOLONE SODIUM SUCC 40 MG IJ SOLR
40.0000 mg | Freq: Two times a day (BID) | INTRAMUSCULAR | Status: DC
  Administered 2024-05-12 – 2024-05-15 (×7): 40 mg via INTRAVENOUS
  Filled 2024-05-12 (×7): qty 1

## 2024-05-12 MED ORDER — GUAIFENESIN-DM 100-10 MG/5ML PO SYRP
5.0000 mL | ORAL_SOLUTION | ORAL | Status: DC | PRN
  Filled 2024-05-12: qty 5

## 2024-05-12 MED ORDER — ENSURE PLUS HIGH PROTEIN PO LIQD: 237.0000 mL | Freq: Two times a day (BID) | ORAL | Status: DC

## 2024-05-12 NOTE — Progress Notes (Signed)
 Initial Nutrition Assessment  DOCUMENTATION CODES:   Not applicable  INTERVENTION:   Ensure Plus High Protein po BID, each supplement provides 350 kcal and 20 grams of protein.  Magic cup TID with meals, each supplement provides 290 kcal and 9 grams of protein   NUTRITION DIAGNOSIS:   Inadequate oral intake related to acute illness as evidenced by per patient/family report.  GOAL:   Patient will meet greater than or equal to 90% of their needs  MONITOR:   PO intake, Supplement acceptance  REASON FOR ASSESSMENT:   Consult Assessment of nutrition requirement/status  ASSESSMENT:   Pt with PMH of hyperthyroidism who lives at home with friends admitted with diffuse lymphadenopathy, CAP, dehydration, syncope.   Pt discussed during ICU rounds and with RN and MD.  Oncology consulted Per report pt has not felt well since November and endorses weight loss   Medications reviewed and include: ferrous sulfate , solumedrol  Labs reviewed:  Iron 27 TIBC 155 Ferritin 595 CRP 5.7     NUTRITION - FOCUSED PHYSICAL EXAM:  Deferred, RD remote   Diet Order:   Diet Order             Diet regular Room service appropriate? Yes; Fluid consistency: Thin  Diet effective now                   EDUCATION NEEDS:   Not appropriate for education at this time  Skin:  Skin Assessment: Reviewed RN Assessment  Last BM:  1/13  Height:   Ht Readings from Last 1 Encounters:  05/11/24 5' 4 (1.626 m)    Weight:   Wt Readings from Last 1 Encounters:  05/11/24 86.2 kg    BMI:  Body mass index is 32.61 kg/m.  Estimated Nutritional Needs:   Kcal:  2000-2200  Protein:  100-115 grams  Fluid:  >2 L/day  Powell SQUIBB., RD, LDN, CNSC See AMiON for contact information

## 2024-05-12 NOTE — Consult Note (Signed)
 " Cardiology Consultation:  Patient ID: Sheri Foster MRN: [id number]; DOB: 08/18/1995  Admit date: 05/10/2024 Date of Consult: 05/12/2024  Primary Care Provider: Mercer Clotilda SAUNDERS, MD Primary Cardiologist: None  Primary Electrophysiologist:  None   History of Present Illness:  Sheri Foster is a 29 y.o. female with a history of hyperthyroid who presented to the ED on 05/10/2024 with fever, neck lymphadenopathy, fatigue, decreased ability to ambulate and generalized weakness x 4 to 5 days.  Had fevers up to 101.  Also had bodyaches and blisters on her hands and feet for several months and was supposedly evaluated for [facility] spotted fever and Lyme disease with a negative workup per prior notes but was treated with doxycycline .  Also with weight loss.  She went to her PCP on 05/10/2024 prior to coming to the ED and had 2 seizure-like activity lasting between 15 and 30 seconds with loss of consciousness no incontinence or postictal state while the provider was trying to palpate her inflamed lymph nodes.  On presentation she was hypotensive at 90/68 and tachycardic at 130 bpm.  She was admitted for severe sepsis from CAP and vasovagal syncope.  Cardiology was consulted for moderate pericardial effusion noted on echocardiogram and CT.  Patient underwent IR guided right iliac lymph node biopsy  Found to have elevated dsDNA and positive Smith antibody and ANA and rheumatology was consulted.  She reports that she has been having exertional shortness of breath and fatigue that has been progressing over the past several months.  Had an episode of chest discomfort several months ago but was told this was likely acid reflux.  Does not get exertional chest pain.  Reports that she did have some swelling in her legs as well.  Last night she had shortness of breath while lying flat.  CTabd/pelvis -  Multiple small retroperitoneal lymph nodes with mild pelvic and iliac adenopathy, these could be inflammatory  or neoplastic in etiology. 6.1 cm hypodense left uterine mass, probably a fibroid.   CTA chest -   no PE,  1. Negative for acute pulmonary embolus or aortic dissection. 2. Cardiomegaly with small to moderate pericardial effusion. 3. Small left-sided pleural effusion with partial subpleural left lower lobe consolidation, atelectasis versus pneumonia. 4. Mild bilateral axillary and subpectoral adenopathy, indeterminate for reactive versus inflammatory versus neoplastic process. Incompletely visualized small soft tissue nodules posterior to the right shoulder measuring up to 10 mm.     Past Medical History: Past Medical History:  Diagnosis Date   Depression    Migraines    Thyroid  disease 2019    Past Surgical History: Past Surgical History:  Procedure Laterality Date   IR US  LIVER BIOPSY  05/11/2024     Allergies:    Allergies[1]  Social History:   Social History   Socioeconomic History   Marital status: Single    Spouse name: Not on file   Number of children: Not on file   Years of education: Not on file   Highest education level: Associate degree: occupational, scientist, product/process development, or vocational program  Occupational History   Not on file  Tobacco Use   Smoking status: Never   Smokeless tobacco: Never  Substance and Sexual Activity   Alcohol use: Yes   Drug use: Never   Sexual activity: Not Currently  Other Topics Concern   Not on file  Social History Narrative   Not on file   Social Drivers of Health   Tobacco Use: Low Risk (05/10/2024)  Patient History    Smoking Tobacco Use: Never    Smokeless Tobacco Use: Never    Passive Exposure: Not on file  Financial Resource Strain: Low Risk (03/02/2024)   Overall Financial Resource Strain (CARDIA)    Difficulty of Paying Living Expenses: Not hard at all  Food Insecurity: No Food Insecurity (05/11/2024)   Epic    Worried About Programme Researcher, Broadcasting/film/video in the Last Year: Never true    Ran Out of Food in the Last Year: Never  true  Transportation Needs: No Transportation Needs (05/11/2024)   Epic    Lack of Transportation (Medical): No    Lack of Transportation (Non-Medical): No  Physical Activity: Insufficiently Active (03/02/2024)   Exercise Vital Sign    Days of Exercise per Week: 2 days    Minutes of Exercise per Session: 30 min  Stress: No Stress Concern Present (03/02/2024)   [employer] of Occupational Health - Occupational Stress Questionnaire    Feeling of Stress: Only a little  Social Connections: Moderately Isolated (03/02/2024)   Social Connection and Isolation Panel    Frequency of Communication with Friends and Family: More than three times a week    Frequency of Social Gatherings with Friends and Family: More than three times a week    Attends Religious Services: More than 4 times per year    Active Member of [employer] or Organizations: No    Attends Banker Meetings: Not on file    Marital Status: Never married  Intimate Partner Violence: Not At Risk (05/11/2024)   Epic    Fear of Current or Ex-Partner: No    Emotionally Abused: No    Physically Abused: No    Sexually Abused: No  Depression (PHQ2-9): Low Risk (05/03/2024)   Depression (PHQ2-9)    PHQ-2 Score: 0  Alcohol Screen: Low Risk (03/02/2024)   Alcohol Screen    Last Alcohol Screening Score (AUDIT): 1  Housing: Low Risk (05/11/2024)   Epic    Unable to Pay for Housing in the Last Year: No    Number of Times Moved in the Last Year: 0    Homeless in the Last Year: No  Utilities: Not At Risk (05/11/2024)   Epic    Threatened with loss of utilities: No  Health Literacy: Not on file     Family History:   Family History  Problem Relation Age of Onset   Asthma Mother    Hyperlipidemia Mother    Hypertension Mother    Hyperthyroidism Mother    Hypothyroidism Other      ROS:  All other ROS reviewed and negative. Pertinent positives noted in the HPI.     Physical Exam/Data:   Vitals:   05/11/24 2046 05/12/24  0050 05/12/24 0453 05/12/24 1324  BP: (!) 147/75 117/79 105/66 101/71  Pulse:  (!) 103 97 94  Resp: 16 16 16    Temp: 98.3 F (36.8 C) 98.2 F (36.8 C) 98.5 F (36.9 C) 97.6 F (36.4 C)  TempSrc: Oral Oral Oral Oral  SpO2: 100% 98% 95% 100%  Weight:      Height:        Intake/Output Summary (Last 24 hours) at 05/12/2024 1515 Last data filed at 05/12/2024 1350 Gross per 24 hour  Intake 2413.54 ml  Output --  Net 2413.54 ml       05/11/2024    5:17 PM 05/10/2024    4:09 PM 05/03/2024    8:35 AM  Last 3 Weights  Weight (lbs) 189 lb 15.9 oz 190 lb 193 lb 6.4 oz  Weight (kg) 86.18 kg 86.183 kg 87.726 kg    Body mass index is 32.61 kg/m.  Physical Exam Vitals and nursing note reviewed.  Constitutional:      Appearance: Normal appearance.  HENT:     Head: Normocephalic and atraumatic.  Eyes:     Conjunctiva/sclera: Conjunctivae normal.  Cardiovascular:     Rate and Rhythm: Normal rate and regular rhythm.  Pulmonary:     Effort: Pulmonary effort is normal.     Breath sounds: Normal breath sounds.  Musculoskeletal:        General: No swelling or tenderness.  Skin:    Coloration: Skin is not jaundiced or pale.  Neurological:     Mental Status: She is alert.      EKG:  The EKG was personally reviewed and demonstrates: Normal sinus rhythm with septal infarct pattern Telemetry:  Telemetry was personally reviewed and demonstrates: Normal sinus rhythm  Relevant CV Studies:  Echo 05/11/24:    1. Moderate pericardial effusion; respiratory variation noted across TV  but no RV diastolic collapse; IVC not dilated.   2. Left ventricular ejection fraction, by estimation, is 60 to 65%. The  left ventricle has normal function. The left ventricle has no regional  wall motion abnormalities. There is mild left ventricular hypertrophy.  Left ventricular diastolic parameters  were normal.   3. Right ventricular systolic function is normal. The right ventricular  size is normal.   4.  Moderate pericardial effusion.   5. The mitral valve is normal in structure. No evidence of mitral valve  regurgitation. No evidence of mitral stenosis.   6. The aortic valve is tricuspid. Aortic valve regurgitation is not  visualized. No aortic stenosis is present.   7. The inferior vena cava is normal in size with greater than 50%  respiratory variability, suggesting right atrial pressure of 3 mmHg.   Comparison(s): No prior Echocardiogram.   Assessment and Plan:  Moderate pericardial effusion without tamponade, likely related to underlying rheumatologic disorder-respiratory variation noted across the tricuspid valve but no RV diastolic collapse or IVC dilatation.  No need for pericardiocentesis at this time but I discussed with her that this may be a possibility in the future.  Continue rheumatologic workup.  Had PND last night.  Repeat limited echocardiogram tomorrow  Sepsis possibly due to pneumonia  Vasovagal syncope -occurred while at PCPs office during lymph node palpation  Diffuse lymphadenopathy with positive autoimmune panel -right iliac lymph node biopsy 05/11/2024.  Follow-up results.  Appreciate rheumatology and oncology input.     For questions or updates, please contact [facility] Please consult [email or web address] for contact info under     Time Spent with Patient: I have spent a total of 65 minutes caring for this patient today face to face, ordering and reviewing labs/tests, reviewing prior records/medical history, examining the patient, establishing an assessment and plan, communicating results/findings to the patient/family, and documenting in the medical record.   Bonney Emeline Calender, DO [place]  [facility]  05/12/2024 3:15 PM      [1] No Known Allergies  "

## 2024-05-12 NOTE — Plan of Care (Signed)

## 2024-05-12 NOTE — Progress Notes (Signed)
 Tele called and reported pt reading in [facility] but not all leads reading consistently, upon entering patients room pt resting comfortably in bed, c/o no pain, no SOB, vitals stable, A&Ox4.   MD Sreeram Notified by [place] RN  STAT EKG obtained   No new orders at this time

## 2024-05-12 NOTE — Evaluation (Signed)
 Physical Therapy Evaluation Patient Details Name: Sheri Foster MRN: [id number] DOB: [birth year] Today's Date: 05/12/2024  History of Present Illness  Sheri Foster is a 29 y.o. female  presented on 05/10/24 with fever, neck lymphadenopathy, fatigue, decreased ability to ambulate.  Patient is currently being admitted to TRH service for evaluation of diffuse lymphadenopathy, pneumonia, AKI, dehydration and syncope.  Pt had biopsy of lymph node with results pending.  Patient has been sick for the past 4 to 5 days for fevers up to 101 and lymph nodes in her neck which is swollen sore throat, pain worse on the right side of her cheek.  She also have had some bodyaches blisters on her hands and feet that has been ongoing for the past few months has been evaluated supposedly for [facility] spotted fever and Lyme disease and workup according to patient has been negative.  Endorses weight loss.  No personal history of autoimmune disorders no family history of lupus or RA.  She does have history of eczema.  But her primary care notes CBC done on 7 January showed smudge cells. Patient went to her primary care provider and had 2 episodes of seizure-like activity in the office lasting between 15 to 30 seconds with LOC, no incontinence, no postictal state, there was some lightheadedness associated with them. Pt with medical history significant of hyperthyroidism  Clinical Impression  Pt admitted with above diagnosis. At baseline, pt independent.  She lives with her friend but her mother and sister plan to assist at d/c so that pt can return home.  Pt's bedroom is upstairs in her home.  Today, pt ambulating 35'x2 with RW and rest break.  She moves slowly and reports easily fatigued but reports some improvement in joint pain and fatigue compared to admission.  Pt is well below her baseline and will benefit from ongoing PT. Recommend HHPT and initial supervision at d/c. Pt currently with functional limitations due to  the deficits listed below (see PT Problem List). Pt will benefit from acute skilled PT to increase their independence and safety with mobility to allow discharge.           If plan is discharge home, recommend the following: A little help with walking and/or transfers;A little help with bathing/dressing/bathroom;Assistance with cooking/housework;Help with stairs or ramp for entrance   Can travel by private vehicle        Equipment Recommendations Other (comment) (Rollator but pt may want to get on her own.  Hospital bed as bedroom upstairs but pt considering this vs getting a recliner)  Recommendations for Other Services       Functional Status Assessment Patient has had a recent decline in their functional status and demonstrates the ability to make significant improvements in function in a reasonable and predictable amount of time.     Precautions / Restrictions Precautions Precautions: Fall      Mobility  Bed Mobility Overal bed mobility: Modified Independent                  Transfers Overall transfer level: Needs assistance Equipment used: None Transfers: Sit to/from Stand Sit to Stand: Contact guard assist           General transfer comment: CGA for safety    Ambulation/Gait Ambulation/Gait assistance: Contact guard assist Gait Distance (Feet): 35 Feet (35'x2) Assistive device: Rolling walker (2 wheels), None Gait Pattern/deviations: Step-through pattern, Decreased stride length Gait velocity: decreased     General Gait Details: Pt started without  RW but reports legs fatiguing, reports improvement in fatigue with RW use.  Pt with slowed gait pattern and easily fatigued.  Ambulated 35'x2 wtih rest break (leaned on window)  Stairs            Wheelchair Mobility     Tilt Bed    Modified Rankin (Stroke Patients Only)       Balance Overall balance assessment: Needs assistance Sitting-balance support: No upper extremity supported, Feet  supported Sitting balance-Leahy Scale: Good     Standing balance support: No upper extremity supported Standing balance-Leahy Scale: Good Standing balance comment: Could ambulate without RW but slow and guarded                             Pertinent Vitals/Pain Pain Assessment Pain Assessment: 0-10 Pain Score: 3  Pain Location: R neck area (reports diffuse joint pain improving) Pain Descriptors / Indicators: Discomfort Pain Intervention(s): Limited activity within patient's tolerance, Monitored during session    Home Living Family/patient expects to be discharged to:: Private residence Living Arrangements: Non-relatives/Friends Available Help at Discharge: Family;Friend(s);Available 24 hours/day (LIves with friend but mother and sister plan to assist at d/c) Type of Home: House Home Access: Stairs to enter Entrance Stairs-Rails: None Entrance Stairs-Number of Steps: 2 (pt reports very small)   Home Layout: Two level;Bed/bath upstairs;1/2 bath on main level Home Equipment: None      Prior Function Prior Level of Function : Independent/Modified Independent;Working/employed;Driving             Mobility Comments: Typically independent; no AD need ADLs Comments: ind with ADLs, IADLs, home mgt, works from home     Extremity/Trunk Assessment   Upper Extremity Assessment Upper Extremity Assessment: Defer to OT evaluation    Lower Extremity Assessment Lower Extremity Assessment: LLE deficits/detail;RLE deficits/detail RLE Deficits / Details: ROM WFL; MMT 4/5; fatigues easily LLE Deficits / Details: ROM WFL; MMT 4/5; fatigues easily    Cervical / Trunk Assessment Cervical / Trunk Assessment: Normal  Communication        Cognition Arousal: Alert Behavior During Therapy: WFL for tasks assessed/performed   PT - Cognitive impairments: No apparent impairments                       PT - Cognition Comments: pleasant and motivated          Cueing       General Comments General comments (skin integrity, edema, etc.): Pt lives with a friend but mother and sister to assist at d/c.  She does not want to go to rehab at d/c.  Pt agreeable with plan for HHPT and advancing to outpt PT.  Discussed options for DME including rollator or RW - pt prefers rollator as it would allow for rest breaks.  Also, discussed options for sleeping as unable to do stairs and bedroom upstairs.  Pt has had difficulty lying flat and sleeping on couch.  Discussed option for rental hospital bed -pt reports will consider that vs getting a recliner.    Exercises     Assessment/Plan    PT Assessment Patient needs continued PT services  PT Problem List Decreased strength;Pain;Decreased activity tolerance;Decreased knowledge of use of DME;Decreased balance;Decreased mobility       PT Treatment Interventions DME instruction;Gait training;Stair training;Functional mobility training;Therapeutic activities;Patient/family education;Neuromuscular re-education;Modalities;Balance training;Therapeutic exercise    PT Goals (Current goals can be found in the Care Plan section)  Acute Rehab  PT Goals Patient Stated Goal: return home PT Goal Formulation: With patient/family Time For Goal Achievement: 05/26/24 Potential to Achieve Goals: Good    Frequency Min 2X/week     Co-evaluation               AM-PAC PT 6 Clicks Mobility  Outcome Measure Help needed turning from your back to your side while in a flat bed without using bedrails?: A Little Help needed moving from lying on your back to sitting on the side of a flat bed without using bedrails?: A Little Help needed moving to and from a bed to a chair (including a wheelchair)?: A Little Help needed standing up from a chair using your arms (e.g., wheelchair or bedside chair)?: A Little Help needed to walk in hospital room?: A Little Help needed climbing 3-5 steps with a railing? : A Lot 6 Click Score:  17    End of Session Equipment Utilized During Treatment: Gait belt Activity Tolerance: Patient tolerated treatment well Patient left: in bed;with call bell/phone within reach;with family/visitor present (Did not turn on alarm as pt aware to call for assist, family present, pt likes to sit on EOB at times) Nurse Communication: Mobility status PT Visit Diagnosis: Other abnormalities of gait and mobility (R26.89);Muscle weakness (generalized) (M62.81)    Time: 8499-8468 PT Time Calculation (min) (ACUTE ONLY): 31 min   Charges:   PT Evaluation $PT Eval Low Complexity: 1 Low PT Treatments $Gait Training: 8-22 mins PT General Charges $$ ACUTE PT VISIT: 1 Visit         Benjiman, PT Acute Rehab [facility] [phone]   Benjiman VEAR [place] 05/12/2024, 3:57 PM

## 2024-05-12 NOTE — Evaluation (Signed)
 Occupational Therapy Evaluation Patient Details Name: Sheri Foster MRN: [id number] DOB: 07/01/1995 Today's Date: 05/12/2024   History of Present Illness   Sheri Foster is a 29 y.o. female with medical history significant of  hyperthyroidism  presented with   fever, neck lymphadenopathy, fatigue, decreased ability to ambulate  Patient has been sick for the past 4 to 5 days for fevers up to 101 lymph nodes in her neck which is swollen sore throat, pain worse on the right side of her cheek.  She also have had some bodyaches blisters on her hands and feet that has been ongoing for the past few months has been evaluated supposedly for [facility] spotted fever and Lyme disease and workup according to patient has been negative  Endorses weight loss  No personal history of autoimmune disorders no family history of lupus or RA.  She does have history of eczema  But her primary care notes CBC done on 7 January showed smudge cells. Patient went to her primary care provider and had 2 episodes of seizure-like activity in the office lasting between 15 to 30 seconds with LOC no incontinence no postictal state there was some lightheadedness associated with them     Clinical Impressions Pt presents with decline in function and safety with ADLs and ADL mobility with impaired strength, balance and endurance; pt reports fatiguing easily recently and spending a lot of time in bed when she became ill, and that;s when dizziness began once she would get OOB to go to bathroom. 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 Pt currently sits EOB Mod I, STS CGA/Sup mobility and transfers with no AD, with HHA, LB ADLs CGA for safety. Pt reports that she doesn become dizzy at times when bending over/down.  Pt with slow guarded pace of movement and no c/o dizziness. OT will follow acutely to maximize level of function and safety BP readings Supine 129/81 Sitting EOB  126/81 Standing x 2 minutes 126/94, HR 109     If plan is discharge home, recommend the following:   A little help with bathing/dressing/bathroom;A little help with walking and/or transfers;Assistance with cooking/housework;Assist for transportation;Help with stairs or ramp for entrance     Functional Status Assessment   Patient has had a recent decline in their functional status and demonstrates the ability to make significant improvements in function in a reasonable and predictable amount of time.     Equipment Recommendations   Tub/shower seat     Recommendations for Other Services         Precautions/Restrictions   Precautions Precautions: Fall;Other (comment) Precaution/Restrictions Comments: dizziness Restrictions Weight Bearing Restrictions Per Provider Order: No     Mobility Bed Mobility Overal bed mobility: Independent                  Transfers Overall transfer level: Needs assistance Equipment used: 1 person hand held assist, None Transfers: Sit to/from Stand, Bed to chair/wheelchair/BSC Sit to Stand: Contact guard assist, Supervision           General transfer comment: CGA with initial STS from EOB, Sup from commode and stepping in and out of shower      Balance Overall balance assessment: Needs assistance Sitting-balance support: No upper extremity supported, Feet supported Sitting balance-Leahy Scale: Good     Standing balance support: Single extremity supported, No upper extremity supported, During functional activity Standing balance-Leahy Scale: Fair Standing balance comment:  slow, guarded pace of movement                           ADL either performed or assessed with clinical judgement   ADL Overall ADL's : Needs assistance/impaired Eating/Feeding: Independent   Grooming: Wash/dry hands;Wash/dry face;Supervision/safety;Standing       Lower Body Bathing: Set up   Upper Body Dressing : Contact guard  assist   Lower Body Dressing: Set up   Toilet Transfer: Supervision/safety;Cueing for safety   Toileting- Clothing Manipulation and Hygiene: Supervision/safety   Tub/ Shower Transfer: Supervision/safety;Ambulation;Grab bars   Functional mobility during ADLs: Contact guard assist;Supervision/safety;Cueing for safety       Vision Ability to See in Adequate Light: 0 Adequate Patient Visual Report: No change from baseline       Perception         Praxis         Pertinent Vitals/Pain Pain Assessment Pain Assessment: 0-10 Pain Score: 5  Pain Location: R neck area Pain Descriptors / Indicators: Numbness Pain Intervention(s): Monitored during session, Repositioned     Extremity/Trunk Assessment Upper Extremity Assessment Upper Extremity Assessment: Right hand dominant;Generalized weakness   Lower Extremity Assessment Lower Extremity Assessment: Defer to PT evaluation       Communication Communication Communication: No apparent difficulties   Cognition Arousal: Alert Behavior During Therapy: WFL for tasks assessed/performed Cognition: No apparent impairments                               Following commands: Intact       Cueing  General Comments          Exercises     Shoulder Instructions      Home Living Family/patient expects to be discharged to:: Private residence Living Arrangements: Non-relatives/Friends Available Help at Discharge: Family;Friend(s) Type of Home: House Home Access: Stairs to enter Secretary/administrator of Steps: 2   Home Layout: Two level;Bed/bath upstairs     Bathroom Shower/Tub: Producer, Television/film/video: Standard     Home Equipment: None          Prior Functioning/Environment Prior Level of Function : Independent/Modified Independent;Working/employed;Driving             Mobility Comments: no AD ADLs Comments: ind with ADLs, IADLs, home mgt, works from home    OT Problem List:  Decreased activity tolerance;Decreased strength;Decreased knowledge of use of DME or AE;Pain   OT Treatment/Interventions: Self-care/ADL training;Therapeutic exercise;Patient/family education;Therapeutic activities;DME and/or AE instruction;Energy conservation      OT Goals(Current goals can be found in the care plan section)   Acute Rehab OT Goals Patient Stated Goal: get better OT Goal Formulation: With patient/family Time For Goal Achievement: 05/26/24 Potential to Achieve Goals: Good ADL Goals Pt Will Perform Grooming: with set-up;with modified independence;standing Pt Will Perform Lower Body Bathing: with supervision;with modified independence;sit to/from stand Pt Will Perform Lower Body Dressing: with supervision;with modified independence;sit to/from stand Pt Will Transfer to Toilet: with modified independence;ambulating Pt Will Perform Toileting - Clothing Manipulation and hygiene: with modified independence;sit to/from stand Pt Will Perform Tub/Shower Transfer: with modified independence   OT Frequency:  Min 2X/week    Co-evaluation              AM-PAC OT 6 Clicks Daily Activity     Outcome Measure Help from another person eating meals?: None Help from another person taking care of  personal grooming?: A Little Help from another person toileting, which includes using toliet, bedpan, or urinal?: A Little Help from another person bathing (including washing, rinsing, drying)?: A Little Help from another person to put on and taking off regular upper body clothing?: A Little Help from another person to put on and taking off regular lower body clothing?: A Little 6 Click Score: 19   End of Session Equipment Utilized During Treatment: Gait belt Nurse Communication: Mobility status  Activity Tolerance: Patient limited by fatigue Patient left: in bed;with call bell/phone within reach;with family/visitor present  OT Visit Diagnosis: Muscle weakness (generalized)  (M62.81)                Time: 8976-8942 OT Time Calculation (min): 34 min Charges:  OT General Charges $OT Visit: 1 Visit OT Evaluation $OT Eval Moderate Complexity: 1 Mod OT Treatments $Therapeutic Activity: 8-22 mins    Jacques Karna Loose 05/12/2024, 12:01 PM

## 2024-05-12 NOTE — Progress Notes (Signed)
 " Progress Note   Patient: Sheri Foster FMW:[id number] DOB: [birth year] DOA: 05/10/2024     1 DOS: the patient was seen and examined on 05/12/2024   Brief hospital course: Sheri Foster is a 29 y.o. female with medical history significant of  hyperthyroidism presented with fever, neck lymphadenopathy, fatigue, decreased ability to ambulate since last 4 days.  Patient states she has been feeling weak since end of December where she went to ER and was treated for recommended spotted fever.  She has been feeling weak, endorses weight loss, has been feeling dizzy upon getting out of bed.  While PCP was performing right peritoneal lymph node exam she did feel dizzy lightheaded had seizure-like episode.  CBC June 7 showed smudge cells referred to emergency department for further management evaluation.   In the emergency department CT abdomen pelvis showed retroperitoneal lymph nodes, mild pelvic and iliac lymphadenopathy, 6.1 cm hypodense left adrenal mass likely fibroid, CTA chest showed no PE, cardiomegaly with small to moderate pericardial effusion, left-sided pleural effusion and partial subpleural left lower lobe consolidation, bilateral axillary and subpectoral adenopathy. Patient is currently being admitted to [facility] service for evaluation of diffuse lymphadenopathy, pneumonia, AKI, dehydration and syncope  Assessment and Plan: Sepsis possibly due to pneumonia: Presented with fever, tachycardia, AKI. Fever could also due to suspicion of active lupus vs lymphoma. Discussed with Dr. Jeannetta who advised IV steroids for active lupus. Continue IV Rocephin , azithromycin  therapy.  Encourage incentive spirometry, out of bed to chair.  Fever, joint pain, weakness, rash, lymphadenopathy- Autoimmune panel positive for ANA, dsDNA, RNP, SSA Ro, La, smooth muscle, thyrotropin receptor.  CT soft tissue neck, head, chest, abdomen reviewed- Multiple lymph node enlargements in the cervical, axillary, mediastinal,  inguinal areas noted.  IR performed inguinal lymph node biopsy, follow path. She did report joint pains, body aches, fatigue, rash. Discussed with Dr. Jeannetta rheumatologist, advised 1mg / kg IV steroid dose. Started Solumedrol 40mg  q12. Discussed with patient and her mother at bedside regarding the diagnosis and treatment.  Acute kidney injury- In the setting of dehydration, poor oral intake, low blood pressures. Kidney function improved with IV fluids. Encourage oral fluids. Avoid nephrotoxic drugs.  Hyponatremia: Due to poor oral intake.  Sodium normal today.  Syncope- Differential include vasovagal, dehydration and hypotension. She has poor oral intake. Encourage oral diet, supplements. Orthostatic vitals ordered.  Echocardiogram shows moderate pericardia effusion. Cardiology consulted.  Acute on chronic anemia- Hemoglobin dropped to 7.7 from 9.3, possible some dilution, also she is currently menstruating. Continue iron supplements. Monitor H/H.      Out of bed to chair. Incentive spirometry. Nursing supportive care. Fall, aspiration precautions. Diet:  Diet Orders (From admission, onward)     Start     Ordered   05/11/24 1548  Diet regular Room service appropriate? Yes; Fluid consistency: Thin  Diet effective now       Question Answer Comment  Room service appropriate? Yes   Fluid consistency: Thin      05/11/24 1548           DVT prophylaxis: SCDs Start: 05/11/24 1548  Level of care: Telemetry   Code Status: Full Code  Subjective: Patient is seen and examined today morning.  She feels weak, has joint pains, body aches.  Was treated for RMSF 2 weeks ago.  Has been having fever night sweats since last 4 days.  Unable to get out of bed.  Physical Exam: Vitals:   05/11/24 2046 05/12/24 0050 05/12/24 0453 05/12/24  1324  BP: (!) 147/75 117/79 105/66 101/71  Pulse:  (!) 103 97 94  Resp: 16 16 16    Temp: 98.3 F (36.8 C) 98.2 F (36.8 C) 98.5 F (36.9 C) 97.6 F  (36.4 C)  TempSrc: Oral Oral Oral Oral  SpO2: 100% 98% 95% 100%  Weight:      Height:        General - Young beast African-American weak female, no apparent distress HEENT - PERRLA, EOMI, atraumatic head, right parotid/lymph node enlarged, tender. Lung - Clear, basal rales, no rhonchi, wheezes. Heart - S1, S2 heard, no murmurs, rubs, no pedal edema. Abdomen - Soft, non tender, obese, bowel sounds good Neuro - Alert, awake and oriented x 3, non focal exam. Skin - Warm and dry.  Data Reviewed:      Latest Ref Rng & Units 05/12/2024   12:49 AM 05/10/2024    6:41 PM 05/10/2024    4:28 PM  CBC  WBC 4.0 - 10.5 K/uL 3.4  6.3  5.0   Hemoglobin 12.0 - 15.0 g/dL 7.7  9.3  9.8   Hematocrit 36.0 - 46.0 % 23.8  29.3  29.9   Platelets 150 - 400 K/uL 200  270  242.0       Latest Ref Rng & Units 05/12/2024   12:49 AM 05/11/2024    6:29 PM 05/11/2024    5:31 PM  BMP  Glucose 70 - 99 mg/dL 92  97  89   BUN 6 - 20 mg/dL 17  19  19    Creatinine 0.44 - 1.00 mg/dL 9.11  9.11  9.13   Sodium 135 - 145 mmol/L 135  137  135   Potassium 3.5 - 5.1 mmol/L 4.0  4.0  4.3   Chloride 98 - 111 mmol/L 109  109  110   CO2 22 - 32 mmol/L 19  22  20    Calcium 8.9 - 10.3 mg/dL 7.3  7.5  7.8    ECHOCARDIOGRAM COMPLETE Result Date: 05/11/2024    ECHOCARDIOGRAM REPORT   Patient Name:   Sheri Foster Date of Exam: 05/11/2024 Medical Rec #:  [id number]        Height:       64.0 in Accession #:    [id number]       Weight:       190.0 lb Date of Birth:  [birth year]        BSA:          1.914 m Patient Age:    28 years         BP:           113/82 mmHg Patient Gender: F                HR:           96 bpm. Exam Location:  Inpatient Procedure: 2D Echo, Cardiac Doppler and Color Doppler (Both Spectral and Color            Flow Doppler were utilized during procedure). Indications:    Fever R50.9  History:        Patient has no prior history of Echocardiogram examinations.                 Signs/Symptoms:Syncope.  Sonographer:     Merlynn Argyle Referring Phys: [id number] ANASTASSIA DOUTOVA  Sonographer Comments: Image acquisition challenging due to patient body habitus and Image acquisition challenging due to respiratory motion. IMPRESSIONS  1. Moderate pericardial effusion; respiratory variation  noted across TV but no RV diastolic collapse; IVC not dilated.  2. Left ventricular ejection fraction, by estimation, is 60 to 65%. The left ventricle has normal function. The left ventricle has no regional wall motion abnormalities. There is mild left ventricular hypertrophy. Left ventricular diastolic parameters were normal.  3. Right ventricular systolic function is normal. The right ventricular size is normal.  4. Moderate pericardial effusion.  5. The mitral valve is normal in structure. No evidence of mitral valve regurgitation. No evidence of mitral stenosis.  6. The aortic valve is tricuspid. Aortic valve regurgitation is not visualized. No aortic stenosis is present.  7. The inferior vena cava is normal in size with greater than 50% respiratory variability, suggesting right atrial pressure of 3 mmHg. Comparison(s): No prior Echocardiogram. FINDINGS  Left Ventricle: Left ventricular ejection fraction, by estimation, is 60 to 65%. The left ventricle has normal function. The left ventricle has no regional wall motion abnormalities. The left ventricular internal cavity size was normal in size. There is  mild left ventricular hypertrophy. Left ventricular diastolic parameters were normal. Right Ventricle: The right ventricular size is normal. Right ventricular systolic function is normal. Left Atrium: Left atrial size was normal in size. Right Atrium: Right atrial size was normal in size. Pericardium: A moderately sized pericardial effusion is present. Mitral Valve: The mitral valve is normal in structure. No evidence of mitral valve regurgitation. No evidence of mitral valve stenosis. Tricuspid Valve: The tricuspid valve is normal in structure.  Tricuspid valve regurgitation is not demonstrated. No evidence of tricuspid stenosis. Aortic Valve: The aortic valve is tricuspid. Aortic valve regurgitation is not visualized. No aortic stenosis is present. Pulmonic Valve: The pulmonic valve was normal in structure. Pulmonic valve regurgitation is trivial. No evidence of pulmonic stenosis. Aorta: The aortic root is normal in size and structure. Venous: The inferior vena cava is normal in size with greater than 50% respiratory variability, suggesting right atrial pressure of 3 mmHg. IAS/Shunts: No atrial level shunt detected by color flow Doppler. Additional Comments: Moderate pericardial effusion; respiratory variation noted across TV but no RV diastolic collapse; IVC not dilated.  LEFT VENTRICLE PLAX 2D LVIDd:         4.00 cm   Diastology LVIDs:         2.50 cm   LV e' medial:    10.90 cm/s LV PW:         1.30 cm   LV E/e' medial:  6.8 LV IVS:        1.20 cm   LV e' lateral:   9.79 cm/s LVOT diam:     2.00 cm   LV E/e' lateral: 7.5 LV SV:         52 LV SV Index:   27 LVOT Area:     3.14 cm  RIGHT VENTRICLE             IVC RV Basal diam:  4.00 cm     IVC diam: 1.20 cm RV Mid diam:    3.60 cm RV S prime:     13.80 cm/s TAPSE (M-mode): 2.6 cm LEFT ATRIUM             Index        RIGHT ATRIUM           Index LA diam:        3.30 cm 1.72 cm/m   RA Area:     13.10 cm LA Vol (A2C):   19.1 ml 9.98  ml/m   RA Volume:   30.90 ml  16.14 ml/m LA Vol (A4C):   26.7 ml 13.95 ml/m LA Biplane Vol: 22.7 ml 11.86 ml/m  AORTIC VALVE             PULMONIC VALVE LVOT Vmax:   95.00 cm/s  PR End Diast Vel: 8.64 msec LVOT Vmean:  67.600 cm/s LVOT VTI:    0.166 m  AORTA Ao Root diam: 3.20 cm Ao Asc diam:  3.20 cm MITRAL VALVE MV Area (PHT): 4.58 cm    SHUNTS MV E velocity: 73.80 cm/s  Systemic VTI:  0.17 m MV A velocity: 58.50 cm/s  Systemic Diam: 2.00 cm MV E/A ratio:  1.26 Redell Shallow MD Electronically signed by Redell Shallow MD Signature Date/Time: 05/11/2024/1:50:01 PM     Final    IR LYMPH NODE CORE BIOPSY Result Date: 05/11/2024 INDICATION: 29 year old female with history of indeterminate lymphadenopathy. EXAM: Ultrasound-guided right iliac lymph node biopsy. MEDICATIONS: None. ANESTHESIA/SEDATION: None. FLUOROSCOPY TIME:  None. COMPLICATIONS: None immediate. PROCEDURE: Informed written consent was obtained from the patient after a thorough discussion of the procedural risks, benefits and alternatives. All questions were addressed. Maximal Sterile Barrier Technique was utilized including caps, mask, sterile gowns, sterile gloves, sterile drape, hand hygiene and skin antiseptic. A timeout was performed prior to the initiation of the procedure. Preprocedure ultrasound evaluation of the right groin demonstrated prominent distal iliac lymphadenopathy. The procedure was planned. Subdermal Local anesthesia was administered at the planned needle entry site with 1% lidocaine . Deeper local anesthetic was administered to the periphery of the lymph node under ultrasound visualization. Next, a 17 gauge coaxial introducer needle was directed to the periphery of the lymph node. This followed by acquisition of 4, 18 gauge core biopsy samples. The samples were split between formalin and saline. The needle was removed. Postprocedure imaging demonstrated no evidence of surrounding hematoma or other complicating features. A sterile bandage was applied. The patient tolerated the procedure well and was transferred back to floor in good condition. IMPRESSION: Technically successful ultrasound-guided right iliac lymph node biopsy. Ester Sides, MD [facility] Electronically Signed   By: Ester Sides M.D.   On: 05/11/2024 11:32   CT ABDOMEN PELVIS W CONTRAST Result Date: 05/10/2024 CLINICAL DATA:  Flu like symptoms lymphadenopathy EXAM: CT ABDOMEN AND PELVIS WITH CONTRAST TECHNIQUE: Multidetector CT imaging of the abdomen and pelvis was  performed using the standard protocol following bolus administration of intravenous contrast. RADIATION DOSE REDUCTION: This exam was performed according to the departmental dose-optimization program which includes automated exposure control, adjustment of the mA and/or kV according to patient size and/or use of iterative reconstruction technique. CONTRAST:  80mL OMNIPAQUE  IOHEXOL  350 MG/ML SOLN COMPARISON:  Ultrasound 02/04/2023 FINDINGS: Lower chest: See separately dictated chest CT. Hepatobiliary: No focal liver abnormality is seen. No gallstones, gallbladder wall thickening, or biliary dilatation. Pancreas: Unremarkable. No pancreatic ductal dilatation or surrounding inflammatory changes. Spleen: Upper normal in size at 13 cm Adrenals/Urinary Tract: Adrenal glands are unremarkable. Kidneys are normal, without renal calculi, focal lesion, or hydronephrosis. Bladder is unremarkable. Stomach/Bowel: Stomach within normal limits. No dilated small bowel. Some fluid in the colon. No acute bowel wall thickening Vascular/Lymphatic: Nonaneurysmal aorta. Multiple subcentimeter retroperitoneal lymph nodes. Mildly enlarged left external iliac and pelvic lymph nodes. On the right, lymph nodes measure up to 14 mm and on the left measure up to 15 mm. Reproductive: Hypodense left uterine mass measuring 6.1 x 5.6 cm, probably a fibroid. No  adnexal mass Other: No ascites.  No free air Musculoskeletal: No acute or suspicious osseous abnormality IMPRESSION: 1. No CT evidence for acute intra-abdominal or pelvic abnormality. 2. Multiple small retroperitoneal lymph nodes with mild pelvic and iliac adenopathy, these could be inflammatory or neoplastic in etiology. 3. 6.1 cm hypodense left uterine mass, probably a fibroid. Electronically Signed   By: Luke Bun M.D.   On: 05/10/2024 21:47   CT Soft Tissue Neck W Contrast Result Date: 05/10/2024 CLINICAL DATA:  Initial metastatic disease evaluation. EXAM: CT NECK WITH CONTRAST  TECHNIQUE: Multidetector CT imaging of the neck was performed using the standard protocol following the bolus administration of intravenous contrast. RADIATION DOSE REDUCTION: This exam was performed according to the departmental dose-optimization program which includes automated exposure control, adjustment of the mA and/or kV according to patient size and/or use of iterative reconstruction technique. CONTRAST:  80mL OMNIPAQUE  IOHEXOL  350 MG/ML SOLN COMPARISON:  None Available. FINDINGS: Pharynx and larynx: Oral cavity within normal limits. Palatine tonsils symmetric and within normal limits. Oropharynx and nasopharynx within normal limits. No retropharyngeal collection. Negative epiglottis. Hypopharynx, supraglottic larynx, and glottis within normal limits. Subglottic airway clear. Salivary glands: There is an area of enhancement measuring approximately 2.1 cm at the posterior aspect of the right parotid gland (series 3, image 28). Superimposed 9 mm hypodensity at this location. Contralateral left parotid gland within normal limits. Submandibular glands within normal limits. Thyroid : Normal. Lymph nodes: Increased number of subcentimeter lymph nodes seen extending along the cervical chains bilaterally. No overtly pathologically enlarged lymph nodes. One of these nodes along the left posterior cervical chain measuring 1 cm in short access demonstrates internal hypodensity, consistent with suppuration and/or necrosis (series 3, image 28). Prominent bilateral axillary lymph nodes noted as well, largest of which measures 1.1 cm on the right (series 3, image 97). Vascular: Normal intravascular enhancement seen within the neck. Limited intracranial: Unremarkable. Visualized orbits: Unremarkable. Mastoids and visualized paranasal sinuses: Visualized paranasal sinuses are largely clear. Mastoid air cells and middle ear cavities are largely clear as well. Skeleton: No worrisome osseous lesions. Upper chest: Better  evaluated on dedicated CT of the chest performed at the same time. Small layering left pleural effusion partially visualized. Pericardial effusion partially visualized as well. Other: None. IMPRESSION: 1. Increased number of subcentimeter lymph nodes extending along the cervical chains bilaterally with prominent axillary adenopathy. 1 cm left posterior cervical chain node demonstrates internal hypodensity, consistent with suppuration and/or necrosis. Findings are nonspecific, and could be reactive in nature. Possible nodal metastatic disease or lymphoproliferative disorder would be the primary differential considerations. 2. 2.1 cm area of enhancement with central hypodensity at the posterior aspect of the right parotid gland. Finding is indeterminate, and could reflect an intraparotid lymph node with surrounding enhancement or possibly primary salivary neoplasm. No other overt changes of acute parotitis. 3. Small layering left pleural effusion and pericardial effusion, better evaluated on dedicated CT of the chest performed at the same time. Electronically Signed   By: Morene Hoard M.D.   On: 05/10/2024 21:46   CT Angio Chest PE W and/or Wo Contrast Result Date: 05/10/2024 CLINICAL DATA:  Syncope flu like symptoms EXAM: CT ANGIOGRAPHY CHEST WITH CONTRAST TECHNIQUE: Multidetector CT imaging of the chest was performed using the standard protocol during bolus administration of intravenous contrast. Multiplanar CT image reconstructions and MIPs were obtained to evaluate the vascular anatomy. RADIATION DOSE REDUCTION: This exam was performed according to the departmental dose-optimization program which includes automated exposure control, adjustment of  the mA and/or kV according to patient size and/or use of iterative reconstruction technique. CONTRAST:  80mL OMNIPAQUE  IOHEXOL  350 MG/ML SOLN COMPARISON:  Chest x-ray 05/10/2024 FINDINGS: Cardiovascular: Satisfactory opacification of the pulmonary arteries to  the segmental level. No evidence of pulmonary embolism. Nonaneurysmal aorta. No dissection. Cardiomegaly. Small moderate pericardial effusion, measures 13 mm maximum thickness on the left side. Mediastinum/Nodes: Patent trachea. No thyroid  mass. Mild bilateral axillary adenopathy. Lymph nodes on the right measuring up to 15 mm. Lymph nodes on the left measuring up to 13 mm. Multiple mildly enlarged subpectoral nodes. Multiple small bilateral subcentimeter supraclavicular nodes. Incompletely visualized small nodules posterior to the right shoulder measuring up to 10 mm on series 5, image 28. Right paratracheal node measuring up to 10 mm. No significantly enlarged hilar nodes. Lungs/Pleura: Small left-sided pleural effusion. Partial subpleural left lower lobe consolidation. Atelectasis in the lingula. Upper Abdomen: See separately dictated CT Musculoskeletal: No acute osseous abnormality. Review of the MIP images confirms the above findings. IMPRESSION: 1. Negative for acute pulmonary embolus or aortic dissection. 2. Cardiomegaly with small to moderate pericardial effusion. 3. Small left-sided pleural effusion with partial subpleural left lower lobe consolidation, atelectasis versus pneumonia. 4. Mild bilateral axillary and subpectoral adenopathy, indeterminate for reactive versus inflammatory versus neoplastic process. Incompletely visualized small soft tissue nodules posterior to the right shoulder measuring up to 10 mm. Electronically Signed   By: Luke Bun M.D.   On: 05/10/2024 21:37   CT Head Wo Contrast Addendum Date: 05/10/2024 ADDENDUM REPORT: 05/10/2024 21:34 ADDENDUM: Small left occipital soft tissue nodule measuring 5 mm, likely a lymph node. Multiple incompletely visualized Peri auricular and parotid nodules, measuring up to 7 mm on the left and 6 mm on the right, potentially lymph nodes. Electronically Signed   By: Luke Bun M.D.   On: 05/10/2024 21:34   Result Date: 05/10/2024 CLINICAL DATA:   Syncopal episodes EXAM: CT HEAD WITHOUT CONTRAST TECHNIQUE: Contiguous axial images were obtained from the base of the skull through the vertex without intravenous contrast. RADIATION DOSE REDUCTION: This exam was performed according to the departmental dose-optimization program which includes automated exposure control, adjustment of the mA and/or kV according to patient size and/or use of iterative reconstruction technique. COMPARISON:  CT brain 04/04/2024 FINDINGS: Brain: No evidence of acute infarction, hemorrhage, hydrocephalus, extra-axial collection or mass lesion/mass effect. Vascular: No hyperdense vessel or unexpected calcification. Skull: Normal. Negative for fracture or focal lesion. Sinuses/Orbits: No acute finding. Other: None IMPRESSION: Negative non contrasted CT appearance of the brain. Electronically Signed: By: Luke Bun M.D. On: 05/10/2024 21:23   DG Chest 2 View Result Date: 05/10/2024 EXAM: 2 VIEW(S) XRAY OF THE CHEST 05/10/2024 03:46:31 PM COMPARISON: 03/15/2024 CLINICAL HISTORY: cough, rash, jt pain, fatigue FINDINGS: LUNGS AND PLEURA: Low lung volumes with increased pulmonary vascularity. Subsegmental atelectasis in left mid lung. Small bilateral pleural effusions with overlying subsegmental atelectasis. No pneumothorax. HEART AND MEDIASTINUM: No acute abnormality of the cardiac and mediastinal silhouettes. BONES AND SOFT TISSUES: No acute osseous abnormality. IMPRESSION: 1. Low lung volumes with increased pulmonary vascularity. 2. Small bilateral pleural effusions with overlying atelectasis. Electronically signed by: Waddell Calk MD 05/10/2024 03:55 PM EST RP Workstation: [id number]    Family Communication: Discussed with patient, mother. They understand and agree. All questions answered.  Disposition: Status is: inpatient because: Fever, lymphadenopathy workup, IV antibiotics, IV steroids, lymph node path pending  Planned Discharge Destination: Home     Time spent: 53  minutes  Author: Concepcion Riser, MD  05/12/2024 1:57 PM Secure chat 7am to 7pm For on call review [email or web address].    "

## 2024-05-12 NOTE — Progress Notes (Signed)
" °   05/12/24 1609  TOC Brief Assessment  Insurance and Status Reviewed  Patient has primary care physician Yes Leslye, Dan, DO)  Home environment has been reviewed From home lives [facility]  Prior level of function: Independent  Forensic Psychologist No current home services  Social Drivers of Health Review SDOH reviewed no interventions necessary  Readmission risk has been reviewed Yes  Transition of care needs no transition of care needs at this time    "

## 2024-05-13 ENCOUNTER — Inpatient Hospital Stay (HOSPITAL_COMMUNITY)

## 2024-05-13 DIAGNOSIS — E871 Hypo-osmolality and hyponatremia: Secondary | ICD-10-CM | POA: Diagnosis not present

## 2024-05-13 DIAGNOSIS — M3212 Pericarditis in systemic lupus erythematosus: Secondary | ICD-10-CM | POA: Diagnosis not present

## 2024-05-13 DIAGNOSIS — E66811 Obesity, class 1: Secondary | ICD-10-CM | POA: Diagnosis not present

## 2024-05-13 DIAGNOSIS — I3139 Other pericardial effusion (noninflammatory): Secondary | ICD-10-CM

## 2024-05-13 DIAGNOSIS — A419 Sepsis, unspecified organism: Secondary | ICD-10-CM | POA: Diagnosis not present

## 2024-05-13 DIAGNOSIS — N179 Acute kidney failure, unspecified: Secondary | ICD-10-CM | POA: Diagnosis not present

## 2024-05-13 DIAGNOSIS — R55 Syncope and collapse: Secondary | ICD-10-CM | POA: Diagnosis not present

## 2024-05-13 DIAGNOSIS — R591 Generalized enlarged lymph nodes: Secondary | ICD-10-CM | POA: Diagnosis not present

## 2024-05-13 DIAGNOSIS — J189 Pneumonia, unspecified organism: Secondary | ICD-10-CM | POA: Diagnosis not present

## 2024-05-13 DIAGNOSIS — R531 Weakness: Secondary | ICD-10-CM | POA: Diagnosis not present

## 2024-05-13 LAB — COMPREHENSIVE METABOLIC PANEL WITH GFR
ALT: 24 U/L (ref 0–44)
AST: 55 U/L — ABNORMAL HIGH (ref 15–41)
Albumin: 2.8 g/dL — ABNORMAL LOW (ref 3.5–5.0)
Alkaline Phosphatase: 24 U/L — ABNORMAL LOW (ref 38–126)
Anion gap: 6 (ref 5–15)
BUN: 16 mg/dL (ref 6–20)
CO2: 21 mmol/L — ABNORMAL LOW (ref 22–32)
Calcium: 8.4 mg/dL — ABNORMAL LOW (ref 8.9–10.3)
Chloride: 106 mmol/L (ref 98–111)
Creatinine, Ser: 0.72 mg/dL (ref 0.44–1.00)
GFR, Estimated: >60 mL/min
Glucose, Bld: 117 mg/dL — ABNORMAL HIGH (ref 70–99)
Potassium: 4.7 mmol/L (ref 3.5–5.1)
Sodium: 134 mmol/L — ABNORMAL LOW (ref 135–145)
Total Bilirubin: 0.4 mg/dL (ref 0.0–1.2)
Total Protein: 7.4 g/dL (ref 6.5–8.1)

## 2024-05-13 LAB — PROTEIN ELECTROPHORESIS, SERUM
Albumin ELP: 2.8 g/dL — ABNORMAL LOW (ref 3.8–4.8)
Alpha 1: 0.4 g/dL — ABNORMAL HIGH (ref 0.2–0.3)
Alpha 2: 0.8 g/dL (ref 0.5–0.9)
Beta 2: 0.3 g/dL (ref 0.2–0.5)
Beta Globulin: 0.3 g/dL — ABNORMAL LOW (ref 0.4–0.6)
Gamma Globulin: 3.2 g/dL — ABNORMAL HIGH (ref 0.8–1.7)
Total Protein: 7.8 g/dL (ref 6.1–8.1)

## 2024-05-13 LAB — IRON,TIBC AND FERRITIN PANEL
%SAT: 9 % — ABNORMAL LOW (ref 16–45)
Ferritin: 424 ng/mL — ABNORMAL HIGH (ref 16–154)
Iron: 16 ug/dL — ABNORMAL LOW (ref 40–190)
TIBC: 184 ug/dL — ABNORMAL LOW (ref 250–450)

## 2024-05-13 LAB — CBC
HCT: 24.6 % — ABNORMAL LOW (ref 36.0–46.0)
Hemoglobin: 7.9 g/dL — ABNORMAL LOW (ref 12.0–15.0)
MCH: 26.3 pg (ref 26.0–34.0)
MCHC: 32.1 g/dL (ref 30.0–36.0)
MCV: 82 fL (ref 80.0–100.0)
Platelets: 266 K/uL (ref 150–400)
RBC: 3 MIL/uL — ABNORMAL LOW (ref 3.87–5.11)
RDW: 14.9 % (ref 11.5–15.5)
WBC: 3.2 K/uL — ABNORMAL LOW (ref 4.0–10.5)
nRBC: 0 % (ref 0.0–0.2)

## 2024-05-13 LAB — ECHOCARDIOGRAM LIMITED
Height: 64 in
Weight: 3039.88 [oz_av]

## 2024-05-13 MED ORDER — PANTOPRAZOLE SODIUM 40 MG IV SOLR
40.0000 mg | INTRAVENOUS | Status: DC
  Administered 2024-05-13: 40 mg via INTRAVENOUS
  Filled 2024-05-13: qty 10

## 2024-05-13 NOTE — Progress Notes (Signed)
 " Progress Note   Patient: Sheri Foster FMW:[id number] DOB: Aug 14, 1995 DOA: 05/10/2024     2 DOS: the patient was seen and examined on 05/13/2024   Brief hospital course: Haniyyah T. Gilleland is a 29 y.o. female with medical history significant of  hyperthyroidism presented with fever, neck lymphadenopathy, fatigue, decreased ability to ambulate since last 4 days.  Patient states she has been feeling weak since end of December where she went to ER and was treated for recommended spotted fever.  She has been feeling weak, endorses weight loss, has been feeling dizzy upon getting out of bed.  While PCP was performing right peritoneal lymph node exam she did feel dizzy lightheaded had seizure-like episode.  CBC June 7 showed smudge cells referred to emergency department for further management evaluation.   In the emergency department CT abdomen pelvis showed retroperitoneal lymph nodes, mild pelvic and iliac lymphadenopathy, 6.1 cm hypodense left adrenal mass likely fibroid, CTA chest showed no PE, cardiomegaly with small to moderate pericardial effusion, left-sided pleural effusion and partial subpleural left lower lobe consolidation, bilateral axillary and subpectoral adenopathy. Patient is currently being admitted to TRH service for evaluation of diffuse lymphadenopathy, pneumonia, AKI, dehydration and syncope  Assessment and Plan: Sepsis possibly due to pneumonia: Presented with fever, tachycardia, AKI. Fever could also due to suspicion of active lupus vs lymphoma. Discussed with Dr. Jeannetta who advised IV steroids for active lupus. Continue IV Rocephin , azithromycin  therapy.  Encourage incentive spirometry, out of bed to chair.  Fever, joint pain, weakness, rash, lymphadenopathy- Autoimmune panel positive for ANA, dsDNA, RNP, SSA Ro, La, smooth muscle, thyrotropin receptor.  CT soft tissue neck, head, chest, abdomen reviewed- Multiple lymph node enlargements in the cervical, axillary, mediastinal,  inguinal areas noted.  IR performed inguinal lymph node biopsy, follow path. She did report joint pains, body aches, fatigue, rash. Dr. Jeannetta rheumatologist, advised 1mg / kg IV steroid dose. Continue Solumedrol 40mg  q12. Added Pantoprazole . Ultrasound abdomen for diffuse abdominal pain.  Acute kidney injury- In the setting of dehydration, poor oral intake, low blood pressures. Kidney function improved. Encourage oral fluids. Avoid nephrotoxic drugs.  Hyponatremia: Due to poor oral intake.  Sodium normal today.  Syncope- Differential include vasovagal, dehydration and hypotension. She has poor oral intake. Encourage oral diet, supplements. She is feeling better today. Echocardiogram shows moderate pericardia effusion. Cardiology on board.  Acute on chronic anemia- Hemoglobin dropped to 7.9 from 9.3, possible some dilution, also she is currently menstruating. Continue iron supplements. Monitor H/H.  Obesity class I- BMI 32.61. Diet, exercise and weight reduction advised.      Out of bed to chair. Incentive spirometry. Nursing supportive care. Fall, aspiration precautions. Diet:  Diet Orders (From admission, onward)     Start     Ordered   05/11/24 1548  Diet regular Room service appropriate? Yes; Fluid consistency: Thin  Diet effective now       Question Answer Comment  Room service appropriate? Yes   Fluid consistency: Thin      05/11/24 1548           DVT prophylaxis: Place and maintain sequential compression device Start: 05/12/24 1356 SCDs Start: 05/11/24 1548  Level of care: Telemetry   Code Status: Full Code  Subjective: Patient is seen and examined today morning.  She feels better today. Eating fair. Dizziness improved. Later patient complained of abdominal discomfort to RN.  Physical Exam: Vitals:   05/13/24 0910 05/13/24 0913 05/13/24 0915 05/13/24 0918  BP:  ROLLEN)  128/96 (!) 124/96 (!) 130/97  Pulse:      Resp:      Temp:      TempSrc:      SpO2:  95% 98% 100%   Weight:      Height:        General - Young obese African-American weak female, no apparent distress HEENT - PERRLA, EOMI, atraumatic head, right parotid/lymph node enlarged, tender. Lung - Clear, basal rales, no rhonchi, wheezes. Heart - S1, S2 heard, no murmurs, rubs, no pedal edema. Abdomen - Soft, non tender, obese, bowel sounds good Neuro - Alert, awake and oriented x 3, non focal exam. Skin - Warm and dry.  Data Reviewed:      Latest Ref Rng & Units 05/13/2024    7:29 AM 05/12/2024   12:49 AM 05/10/2024    6:41 PM  CBC  WBC 4.0 - 10.5 K/uL 3.2  3.4  6.3   Hemoglobin 12.0 - 15.0 g/dL 7.9  7.7  9.3   Hematocrit 36.0 - 46.0 % 24.6  23.8  29.3   Platelets 150 - 400 K/uL 266  200  270       Latest Ref Rng & Units 05/12/2024   12:49 AM 05/11/2024    6:29 PM 05/11/2024    5:31 PM  BMP  Glucose 70 - 99 mg/dL 92  97  89   BUN 6 - 20 mg/dL 17  19  19    Creatinine 0.44 - 1.00 mg/dL 9.11  9.11  9.13   Sodium 135 - 145 mmol/L 135  137  135   Potassium 3.5 - 5.1 mmol/L 4.0  4.0  4.3   Chloride 98 - 111 mmol/L 109  109  110   CO2 22 - 32 mmol/L 19  22  20    Calcium 8.9 - 10.3 mg/dL 7.3  7.5  7.8    ECHOCARDIOGRAM LIMITED Result Date: 05/13/2024    ECHOCARDIOGRAM LIMITED REPORT   Patient Name:   Jeani T. Lasure Date of Exam: 05/13/2024 Medical Rec #:  [id number]        Height:       64.0 in Accession #:    [id number]       Weight:       190.0 lb Date of Birth:  01/03/1996        BSA:          1.914 m Patient Age:    28 years         BP:           122/91 mmHg Patient Gender: F                HR:           73 bpm. Exam Location:  Inpatient Procedure: Limited Echo (Both Spectral and Color Flow Doppler were utilized            during procedure). Indications:    I31.3 Pericardial effusion (noninflammatory)  History:        Patient has prior history of Echocardiogram examinations, most                 recent 05/11/2024. Signs/Symptoms:Syncope.  Sonographer:    Nathanel Devonshire Referring  Phys: [id number] JARED E SEGAL IMPRESSIONS  1. Left ventricular ejection fraction, by estimation, is 60 to 65%. The left ventricle has normal function.  2. Right ventricular systolic function is normal. The right ventricular size is normal.  3. A small pericardial effusion is present. The  pericardial effusion is posterior to the left ventricle.  4. The mitral valve is grossly normal. Trivial mitral valve regurgitation. No evidence of mitral stenosis.  5. The inferior vena cava is normal in size with greater than 50% respiratory variability, suggesting right atrial pressure of 3 mmHg. Comparison(s): Changes from prior study are noted. Effusion is small and improved from prior study. FINDINGS  Left Ventricle: Left ventricular ejection fraction, by estimation, is 60 to 65%. The left ventricle has normal function. Right Ventricle: The right ventricular size is normal. No increase in right ventricular wall thickness. Right ventricular systolic function is normal. Pericardium: A small pericardial effusion is present. The pericardial effusion is posterior to the left ventricle. Mitral Valve: The mitral valve is grossly normal. Trivial mitral valve regurgitation. No evidence of mitral valve stenosis. Venous: The inferior vena cava is normal in size with greater than 50% respiratory variability, suggesting right atrial pressure of 3 mmHg. Additional Comments: There is a small pleural effusion in the left lateral region.  IVC IVC diam: 1.20 cm Darryle Decent MD Electronically signed by Darryle Decent MD Signature Date/Time: 05/13/2024/12:39:07 PM    Final     Family Communication: Discussed with patient, mother. They understand and agree. All questions answered.  Disposition: Status is: inpatient because: Fever, lymphadenopathy workup, IV antibiotics, IV steroids, lymph node path pending  Planned Discharge Destination: Home     Time spent: 46 minutes  Author: Concepcion Riser, MD 05/13/2024 1:02 PM Secure chat  7am to 7pm For on call review [email or web address].    "

## 2024-05-13 NOTE — Plan of Care (Signed)

## 2024-05-13 NOTE — Progress Notes (Signed)
 "  Patient Name: Sheri Foster. Mercer Date of Encounter: 05/13/2024  Primary Cardiologist:   Emeline FORBES Calender, DO   Subjective   The patient has no acute complaints this morning.  She is not having any chest pain or shortness of breath.  She is not having any dizziness.  Inpatient Medications    Scheduled Meds:  azithromycin   250 mg Oral Daily   feeding supplement  237 mL Oral BID BM   ferrous sulfate   325 mg Oral Q breakfast   methylPREDNISolone  (SOLU-MEDROL ) injection  40 mg Intravenous Q12H   Continuous Infusions:  cefTRIAXone  (ROCEPHIN )  IV 1 g (05/13/24 9161)   metronidazole  100 mL/hr at 05/12/24 2036   PRN Meds: acetaminophen  **OR** acetaminophen , fentaNYL  (SUBLIMAZE ) injection, guaiFENesin -dextromethorphan , HYDROcodone -acetaminophen , ondansetron  **OR** ondansetron  (ZOFRAN ) IV   Vital Signs    Vitals:   05/12/24 1642 05/12/24 1742 05/12/24 2119 05/13/24 0600  BP: (!) 124/91 (!) 120/91 121/81 (!) 122/91  Pulse:  93 87 75  Resp:   20 20  Temp:   97.7 F (36.5 C) (!) 97.5 F (36.4 C)  TempSrc:   Oral Oral  SpO2:  100% 99% 97%  Weight:      Height:        Intake/Output Summary (Last 24 hours) at 05/13/2024 0851 Last data filed at 05/13/2024 0302 Gross per 24 hour  Intake 540 ml  Output --  Net 540 ml   Filed Weights   05/11/24 1717  Weight: 86.2 kg    Telemetry    NSR - Personally Reviewed  ECG    NA - Personally Reviewed  Physical Exam   GEN: No acute distress.   Neck: No  JVD Cardiac: RRR, no murmurs, rubs, or gallops.  Respiratory: Clear  to auscultation bilaterally. GI: Soft, nontender, non-distended  MS: No  edema; No deformity. Neuro:  Nonfocal  Psych: Normal affect   Labs    Chemistry Recent Labs  Lab 05/10/24 1841 05/11/24 0453 05/11/24 1731 05/11/24 1829 05/12/24 0049  NA 130*   < > 135 137 135  K 4.4   < > 4.3 4.0 4.0  CL 100   < > 110 109 109  CO2 21*   < > 20* 22 19*  GLUCOSE 125*   < > 89 97 92  BUN 21*   < > 19 19 17    CREATININE 1.48*   < > 0.86 0.88 0.88  CALCIUM 7.9*   < > 7.8* 7.5* 7.3*  PROT 7.9  --   --   --   --   ALBUMIN 2.7*  --   --   --   --   AST 37  --   --   --   --   ALT 13  --   --   --   --   ALKPHOS 21*  --   --   --   --   BILITOT 0.6  --   --   --   --   GFRNONAA 48*   < > >60 >60 >60  ANIONGAP 8   < > 5 7 7    < > = values in this interval not displayed.     Hematology Recent Labs  Lab 05/10/24 1841 05/11/24 0452 05/12/24 0049 05/13/24 0729  WBC 6.3  --  3.4* 3.2*  RBC 3.57* 2.92* 2.94* 3.00*  HGB 9.3*  --  7.7* 7.9*  HCT 29.3*  --  23.8* 24.6*  MCV 82.1  --  81.0 82.0  MCH 26.1  --  26.2 26.3  MCHC 31.7  --  32.4 32.1  RDW 14.4  --  14.6 14.9  PLT 270  --  200 266    Cardiac EnzymesNo results for input(s): TROPONINI in the last 168 hours. No results for input(s): TROPIPOC in the last 168 hours.   BNPNo results for input(s): BNP, PROBNP in the last 168 hours.   DDimer No results for input(s): DDIMER in the last 168 hours.   Radiology    ECHOCARDIOGRAM COMPLETE Result Date: 05/11/2024    ECHOCARDIOGRAM REPORT   Patient Name:   Sheri Foster Date of Exam: 05/11/2024 Medical Rec #:  [id number]        Height:       64.0 in Accession #:    [id number]       Weight:       190.0 lb Date of Birth:  March 31, 1996        BSA:          1.914 m Patient Age:    28 years         BP:           113/82 mmHg Patient Gender: F                HR:           96 bpm. Exam Location:  Inpatient Procedure: 2D Echo, Cardiac Doppler and Color Doppler (Both Spectral and Color            Flow Doppler were utilized during procedure). Indications:    Fever R50.9  History:        Patient has no prior history of Echocardiogram examinations.                 Signs/Symptoms:Syncope.  Sonographer:    Merlynn Argyle Referring Phys: [id number] ANASTASSIA DOUTOVA  Sonographer Comments: Image acquisition challenging due to patient body habitus and Image acquisition challenging due to respiratory motion.  IMPRESSIONS  1. Moderate pericardial effusion; respiratory variation noted across TV but no RV diastolic collapse; IVC not dilated.  2. Left ventricular ejection fraction, by estimation, is 60 to 65%. The left ventricle has normal function. The left ventricle has no regional wall motion abnormalities. There is mild left ventricular hypertrophy. Left ventricular diastolic parameters were normal.  3. Right ventricular systolic function is normal. The right ventricular size is normal.  4. Moderate pericardial effusion.  5. The mitral valve is normal in structure. No evidence of mitral valve regurgitation. No evidence of mitral stenosis.  6. The aortic valve is tricuspid. Aortic valve regurgitation is not visualized. No aortic stenosis is present.  7. The inferior vena cava is normal in size with greater than 50% respiratory variability, suggesting right atrial pressure of 3 mmHg. Comparison(s): No prior Echocardiogram. FINDINGS  Left Ventricle: Left ventricular ejection fraction, by estimation, is 60 to 65%. The left ventricle has normal function. The left ventricle has no regional wall motion abnormalities. The left ventricular internal cavity size was normal in size. There is  mild left ventricular hypertrophy. Left ventricular diastolic parameters were normal. Right Ventricle: The right ventricular size is normal. Right ventricular systolic function is normal. Left Atrium: Left atrial size was normal in size. Right Atrium: Right atrial size was normal in size. Pericardium: A moderately sized pericardial effusion is present. Mitral Valve: The mitral valve is normal in structure. No evidence of mitral valve regurgitation. No evidence of mitral valve stenosis. Tricuspid Valve: The tricuspid valve  is normal in structure. Tricuspid valve regurgitation is not demonstrated. No evidence of tricuspid stenosis. Aortic Valve: The aortic valve is tricuspid. Aortic valve regurgitation is not visualized. No aortic stenosis is  present. Pulmonic Valve: The pulmonic valve was normal in structure. Pulmonic valve regurgitation is trivial. No evidence of pulmonic stenosis. Aorta: The aortic root is normal in size and structure. Venous: The inferior vena cava is normal in size with greater than 50% respiratory variability, suggesting right atrial pressure of 3 mmHg. IAS/Shunts: No atrial level shunt detected by color flow Doppler. Additional Comments: Moderate pericardial effusion; respiratory variation noted across TV but no RV diastolic collapse; IVC not dilated.  LEFT VENTRICLE PLAX 2D LVIDd:         4.00 cm   Diastology LVIDs:         2.50 cm   LV e' medial:    10.90 cm/s LV PW:         1.30 cm   LV E/e' medial:  6.8 LV IVS:        1.20 cm   LV e' lateral:   9.79 cm/s LVOT diam:     2.00 cm   LV E/e' lateral: 7.5 LV SV:         52 LV SV Index:   27 LVOT Area:     3.14 cm  RIGHT VENTRICLE             IVC RV Basal diam:  4.00 cm     IVC diam: 1.20 cm RV Mid diam:    3.60 cm RV S prime:     13.80 cm/s TAPSE (M-mode): 2.6 cm LEFT ATRIUM             Index        RIGHT ATRIUM           Index LA diam:        3.30 cm 1.72 cm/m   RA Area:     13.10 cm LA Vol (A2C):   19.1 ml 9.98 ml/m   RA Volume:   30.90 ml  16.14 ml/m LA Vol (A4C):   26.7 ml 13.95 ml/m LA Biplane Vol: 22.7 ml 11.86 ml/m  AORTIC VALVE             PULMONIC VALVE LVOT Vmax:   95.00 cm/s  PR End Diast Vel: 8.64 msec LVOT Vmean:  67.600 cm/s LVOT VTI:    0.166 m  AORTA Ao Root diam: 3.20 cm Ao Asc diam:  3.20 cm MITRAL VALVE MV Area (PHT): 4.58 cm    SHUNTS MV E velocity: 73.80 cm/s  Systemic VTI:  0.17 m MV A velocity: 58.50 cm/s  Systemic Diam: 2.00 cm MV E/A ratio:  1.26 Redell Shallow MD Electronically signed by Redell Shallow MD Signature Date/Time: 05/11/2024/1:50:01 PM    Final    IR LYMPH NODE CORE BIOPSY Result Date: 05/11/2024 INDICATION: 29 year old female with history of indeterminate lymphadenopathy. EXAM: Ultrasound-guided right iliac lymph node biopsy.  MEDICATIONS: None. ANESTHESIA/SEDATION: None. FLUOROSCOPY TIME:  None. COMPLICATIONS: None immediate. PROCEDURE: Informed written consent was obtained from the patient after a thorough discussion of the procedural risks, benefits and alternatives. All questions were addressed. Maximal Sterile Barrier Technique was utilized including caps, mask, sterile gowns, sterile gloves, sterile drape, hand hygiene and skin antiseptic. A timeout was performed prior to the initiation of the procedure. Preprocedure ultrasound evaluation of the right groin demonstrated prominent distal iliac lymphadenopathy. The procedure was planned. Subdermal Local anesthesia was administered at  the planned needle entry site with 1% lidocaine . Deeper local anesthetic was administered to the periphery of the lymph node under ultrasound visualization. Next, a 17 gauge coaxial introducer needle was directed to the periphery of the lymph node. This followed by acquisition of 4, 18 gauge core biopsy samples. The samples were split between formalin and saline. The needle was removed. Postprocedure imaging demonstrated no evidence of surrounding hematoma or other complicating features. A sterile bandage was applied. The patient tolerated the procedure well and was transferred back to floor in good condition. IMPRESSION: Technically successful ultrasound-guided right iliac lymph node biopsy. Ester Sides, MD [facility] Electronically Signed   By: Ester Sides M.D.   On: 05/11/2024 11:32    Cardiac Studies   Echo see above  Patient Profile     29 y.o. female without cardiac history who has been diagnosed with lymphadenopathy and is being evaluated for this.  She had a syncopal episode in her primary care office.  She is found to have moderate pericardial effusion.  Assessment & Plan    Moderate pericardial effusion without tamponade:    Secondary to RA.  Plan is for repeat limited  echo to reassess size.  The pericardium is posterior and small.  There is no evidence of warmth tamponade physiology.  It actually seems to be somewhat improved from previous.  We will follow this clinically.  She can have a repeat echo if not an outpatient.   Sepsis possibly due to pneumonia:  Management per primary team.      Diffuse lymphadenopathy with positive autoimmune panel -right iliac lymph node biopsy 05/11/2024.  Follow-up results.  Appreciate rheumatology and oncology input.  For questions or updates, please contact [facility] Please consult [email or web address] for contact info under Cardiology/STEMI.   Signed, Lynwood Schilling, MD  05/13/2024, 8:51 AM    "

## 2024-05-14 ENCOUNTER — Ambulatory Visit: Payer: Self-pay | Admitting: Family Medicine

## 2024-05-14 DIAGNOSIS — J189 Pneumonia, unspecified organism: Secondary | ICD-10-CM | POA: Diagnosis not present

## 2024-05-14 DIAGNOSIS — I3139 Other pericardial effusion (noninflammatory): Secondary | ICD-10-CM | POA: Diagnosis not present

## 2024-05-14 DIAGNOSIS — N179 Acute kidney failure, unspecified: Secondary | ICD-10-CM | POA: Diagnosis not present

## 2024-05-14 DIAGNOSIS — R55 Syncope and collapse: Secondary | ICD-10-CM | POA: Diagnosis not present

## 2024-05-14 LAB — CBC
HCT: 25.9 % — ABNORMAL LOW (ref 36.0–46.0)
Hemoglobin: 8.2 g/dL — ABNORMAL LOW (ref 12.0–15.0)
MCH: 26.5 pg (ref 26.0–34.0)
MCHC: 31.7 g/dL (ref 30.0–36.0)
MCV: 83.8 fL (ref 80.0–100.0)
Platelets: 301 K/uL (ref 150–400)
RBC: 3.09 MIL/uL — ABNORMAL LOW (ref 3.87–5.11)
RDW: 15.2 % (ref 11.5–15.5)
WBC: 5.5 K/uL (ref 4.0–10.5)
nRBC: 0.4 % — ABNORMAL HIGH (ref 0.0–0.2)

## 2024-05-14 MED ORDER — PANTOPRAZOLE SODIUM 40 MG IV SOLR
40.0000 mg | Freq: Every day | INTRAVENOUS | Status: DC
  Administered 2024-05-14 – 2024-05-15 (×2): 40 mg via INTRAVENOUS
  Filled 2024-05-14 (×2): qty 10

## 2024-05-14 NOTE — Plan of Care (Signed)

## 2024-05-14 NOTE — Progress Notes (Signed)
 Physical Therapy Treatment Patient Details Name: Sheri Foster MRN: [id number] DOB: 09-29-1995 Today's Date: 05/14/2024   History of Present Illness Sheri Foster is a 29 y.o. female  presented on 05/10/24 with fever, neck lymphadenopathy, fatigue, decreased ability to ambulate.  Patient is currently being admitted to TRH service for evaluation of diffuse lymphadenopathy, pneumonia, AKI, dehydration and syncope.  Pt had biopsy of lymph node with results pending.  Patient has been sick for the past 4 to 5 days for fevers up to 101 and lymph nodes in her neck which is swollen sore throat, pain worse on the right side of her cheek.  She also have had some bodyaches blisters on her hands and feet that has been ongoing for the past few months has been evaluated supposedly for [facility] spotted fever and Lyme disease and workup according to patient has been negative.  Endorses weight loss.  No personal history of autoimmune disorders no family history of lupus or RA.  She does have history of eczema.  But her primary care notes CBC done on 7 January showed smudge cells. Patient went to her primary care provider and had 2 episodes of seizure-like activity in the office lasting between 15 to 30 seconds with LOC, no incontinence, no postictal state, there was some lightheadedness associated with them. Pt with medical history significant of hyperthyroidism    PT Comments  Pt very slow and cautious with mobilizing but appears steady.  Pt prefers to use RW for endurance (reports more LE fatigue if not using RW).  Pt able to progress distance to 120 ft today.  Pt anticipates d/c home with family support and f/u HHPT.  (Also discussed OPPT however pt does not feel she will be able to drive or have a ride to appointments yet)    If plan is discharge home, recommend the following: A little help with walking and/or transfers;A little help with bathing/dressing/bathroom;Assistance with cooking/housework;Help with  stairs or ramp for entrance   Can travel by private vehicle        Equipment Recommendations  Other (comment) (rollator)    Recommendations for Other Services       Precautions / Restrictions Precautions Precautions: None     Mobility  Bed Mobility Overal bed mobility: Modified Independent                  Transfers Overall transfer level: Modified independent                      Ambulation/Gait Ambulation/Gait assistance: Supervision Gait Distance (Feet): 120 Feet Assistive device: Rolling walker (2 wheels) Gait Pattern/deviations: Step-through pattern, Decreased stride length Gait velocity: decreased     General Gait Details: pt utilizes RW due to LE fatigue, able to progress distance today, no buckling or LOB observed   Stairs             Wheelchair Mobility     Tilt Bed    Modified Rankin (Stroke Patients Only)       Balance Overall balance assessment: Needs assistance Sitting-balance support: No upper extremity supported, Feet supported Sitting balance-Leahy Scale: Good     Standing balance support: No upper extremity supported Standing balance-Leahy Scale: Good Standing balance comment: Could ambulate without RW but slow and guarded                            Communication Communication Communication: No apparent difficulties  Cognition Arousal: Alert Behavior During Therapy: WFL for tasks assessed/performed   PT - Cognitive impairments: No apparent impairments                       PT - Cognition Comments: pleasant and motivated        Cueing    Exercises      General Comments        Pertinent Vitals/Pain Pain Assessment Pain Assessment: No/denies pain    Home Living                          Prior Function            PT Goals (current goals can now be found in the care plan section) Progress towards PT goals: Progressing toward goals    Frequency    Min  2X/week      PT Plan      Co-evaluation              AM-PAC PT 6 Clicks Mobility   Outcome Measure  Help needed turning from your back to your side while in a flat bed without using bedrails?: A Little Help needed moving from lying on your back to sitting on the side of a flat bed without using bedrails?: A Little Help needed moving to and from a bed to a chair (including a wheelchair)?: A Little Help needed standing up from a chair using your arms (e.g., wheelchair or bedside chair)?: A Little Help needed to walk in hospital room?: A Little Help needed climbing 3-5 steps with a railing? : A Little 6 Click Score: 18    End of Session   Activity Tolerance: Patient tolerated treatment well Patient left: in chair;with call bell/phone within reach   PT Visit Diagnosis: Other abnormalities of gait and mobility (R26.89);Muscle weakness (generalized) (M62.81)     Time: 8588-8571 PT Time Calculation (min) (ACUTE ONLY): 17 min  Charges:    $Gait Training: 8-22 mins PT General Charges $$ ACUTE PT VISIT: 1 Visit                    Tari KLEIN, DPT Physical Therapist Acute Rehabilitation Services Office: [phone]   Tari CROME [place] 05/14/2024, 4:26 PM

## 2024-05-14 NOTE — Progress Notes (Signed)
 "  Patient Name: Sheri Foster. Kross Date of Encounter: 05/14/2024  Primary Cardiologist:   Emeline FORBES Calender, DO   Subjective   No chest pain.  No SOB.   Inpatient Medications    Scheduled Meds:  azithromycin   250 mg Oral Daily   feeding supplement  237 mL Oral BID BM   ferrous sulfate   325 mg Oral Q breakfast   methylPREDNISolone  (SOLU-MEDROL ) injection  40 mg Intravenous Q12H   pantoprazole  (PROTONIX ) IV  40 mg Intravenous Q24H   Continuous Infusions:  cefTRIAXone  (ROCEPHIN )  IV Stopped (05/13/24 0908)   PRN Meds: acetaminophen  **OR** acetaminophen , fentaNYL  (SUBLIMAZE ) injection, guaiFENesin -dextromethorphan , HYDROcodone -acetaminophen , ondansetron  **OR** ondansetron  (ZOFRAN ) IV   Vital Signs    Vitals:   05/13/24 0918 05/13/24 1411 05/13/24 2118 05/14/24 0435  BP: (!) 130/97 (!) 137/91 (!) 137/96 (!) 143/100  Pulse:  67 77 65  Resp:  16 18 18   Temp:  98 F (36.7 C) (!) 97.5 F (36.4 C) (!) 97.4 F (36.3 C)  TempSrc:  Oral Oral Oral  SpO2:  100% 99% 100%  Weight:      Height:        Intake/Output Summary (Last 24 hours) at 05/14/2024 0759 Last data filed at 05/13/2024 2000 Gross per 24 hour  Intake 240 ml  Output --  Net 240 ml   Filed Weights   05/11/24 1717  Weight: 86.2 kg    ECG    NA   Physical Exam   GEN: No  acute distress.   Cardiac: RRR, no murmurs, rubs, or gallops.     Labs    Chemistry Recent Labs  Lab 05/10/24 1544 05/10/24 1841 05/11/24 0453 05/11/24 1829 05/12/24 0049 05/13/24 2024  NA  --  130*   < > 137 135 134*  K  --  4.4   < > 4.0 4.0 4.7  CL  --  100   < > 109 109 106  CO2  --  21*   < > 22 19* 21*  GLUCOSE  --  125*   < > 97 92 117*  BUN  --  21*   < > 19 17 16   CREATININE  --  1.48*   < > 0.88 0.88 0.72  CALCIUM  --  7.9*   < > 7.5* 7.3* 8.4*  PROT 7.8 7.9  --   --   --  7.4  ALBUMIN  --  2.7*  --   --   --  2.8*  AST  --  37  --   --   --  55*  ALT  --  13  --   --   --  24  ALKPHOS  --  21*  --   --   --  24*   BILITOT  --  0.6  --   --   --  0.4  GFRNONAA  --  48*   < > >60 >60 >60  ANIONGAP  --  8   < > 7 7 6    < > = values in this interval not displayed.     Hematology Recent Labs  Lab 05/12/24 0049 05/13/24 0729 05/14/24 0615  WBC 3.4* 3.2* 5.5  RBC 2.94* 3.00* 3.09*  HGB 7.7* 7.9* 8.2*  HCT 23.8* 24.6* 25.9*  MCV 81.0 82.0 83.8  MCH 26.2 26.3 26.5  MCHC 32.4 32.1 31.7  RDW 14.6 14.9 15.2  PLT 200 266 301    Cardiac EnzymesNo results for input(s): TROPONINI in  the last 168 hours. No results for input(s): TROPIPOC in the last 168 hours.   BNPNo results for input(s): BNP, PROBNP in the last 168 hours.   DDimer No results for input(s): DDIMER in the last 168 hours.   Radiology    US  Abdomen Complete Result Date: 05/13/2024 EXAM: COMPLETE ABDOMINAL ULTRASOUND TECHNIQUE: Real-time ultrasonography of the abdomen was performed. COMPARISON: US  Abdomen Complete 02/04/2023, CT from 05/10/2024. CLINICAL HISTORY: Abdominal pain. FINDINGS: LIVER: Increased echogenicity in the liver is noted, consistent with fatty infiltration. No intrahepatic biliary ductal dilatation. No mass. BILIARY SYSTEM: Gallbladder wall thickness measures 1.7 mm. No pericholecystic fluid. No cholelithiasis. Common bile duct is within normal limits measuring 2.8 mm. KIDNEYS: Right Kidney measures 10.5 cm in length. Left Kidney measures 10.8 cm in length. Normal contour of kidneys. Normal cortical echogenicity. No hydronephrosis. No calculus. No mass. PANCREAS: Visualized portions of the pancreas are unremarkable. SPLEEN: Spleen length measures 10.9 cm. No other abnormality. VESSELS: Visualized portion of the aorta is normal. Visualized portion of the inferior vena cava is normal. Hepatopetal flow in the portal vein. OTHER: Minimal free fluid is noted adjacent to the spleen. IMPRESSION: 1. Fatty liver. 2. Minimal free fluid adjacent to the spleen. Electronically signed by: Oneil Devonshire MD 05/13/2024 08:19 PM EST RP  Workstation: [id number]   ECHOCARDIOGRAM LIMITED Result Date: 05/13/2024    ECHOCARDIOGRAM LIMITED REPORT   Patient Name:   Sheri Foster Date of Exam: 05/13/2024 Medical Rec #:  [id number]        Height:       64.0 in Accession #:    [id number]       Weight:       190.0 lb Date of Birth:  22-Feb-1996        BSA:          1.914 m Patient Age:    28 years         BP:           122/91 mmHg Patient Gender: F                HR:           73 bpm. Exam Location:  Inpatient Procedure: Limited Echo (Both Spectral and Color Flow Doppler were utilized            during procedure). Indications:    I31.3 Pericardial effusion (noninflammatory)  History:        Patient has prior history of Echocardiogram examinations, most                 recent 05/11/2024. Signs/Symptoms:Syncope.  Sonographer:    Nathanel Devonshire Referring Phys: [id number] JARED E SEGAL IMPRESSIONS  1. Left ventricular ejection fraction, by estimation, is 60 to 65%. The left ventricle has normal function.  2. Right ventricular systolic function is normal. The right ventricular size is normal.  3. A small pericardial effusion is present. The pericardial effusion is posterior to the left ventricle.  4. The mitral valve is grossly normal. Trivial mitral valve regurgitation. No evidence of mitral stenosis.  5. The inferior vena cava is normal in size with greater than 50% respiratory variability, suggesting right atrial pressure of 3 mmHg. Comparison(s): Changes from prior study are noted. Effusion is small and improved from prior study. FINDINGS  Left Ventricle: Left ventricular ejection fraction, by estimation, is 60 to 65%. The left ventricle has normal function. Right Ventricle: The right ventricular size is normal. No increase in right ventricular wall thickness. Right  ventricular systolic function is normal. Pericardium: A small pericardial effusion is present. The pericardial effusion is posterior to the left ventricle. Mitral Valve: The mitral valve is grossly  normal. Trivial mitral valve regurgitation. No evidence of mitral valve stenosis. Venous: The inferior vena cava is normal in size with greater than 50% respiratory variability, suggesting right atrial pressure of 3 mmHg. Additional Comments: There is a small pleural effusion in the left lateral region.  IVC IVC diam: 1.20 cm Darryle Decent MD Electronically signed by Darryle Decent MD Signature Date/Time: 05/13/2024/12:39:07 PM    Final     Cardiac Studies   Echo see above  Patient Profile     29 y.o. female without cardiac history who has been diagnosed with lymphadenopathy and is being evaluated for this.  She had a syncopal episode in her primary care office.  She is found to have moderate pericardial effusion.  Assessment & Plan    Moderate pericardial effusion without tamponade:    Secondary to presumed lupus.  (Work up ongoing.)  Follow-up echo yesterday demonstrated this to be small.  She will follow in our [place] office with Dr. Kriste on 1/29.     Sepsis possibly due to pneumonia:  Management per primary team.    Syncope:  Agree that this was probably vagal.  No arrhythmias this admission.   No further work up planned  Please call with further questions.   For questions or updates, please contact [facility] Please consult [email or web address] for contact info under Cardiology/STEMI.   Signed, Lynwood Schilling, MD  05/14/2024, 7:59 AM    "

## 2024-05-14 NOTE — Progress Notes (Signed)
 " Progress Note   Patient: Sheri Foster FMW:[id number] DOB: 07/11/1995 DOA: 05/10/2024     3 DOS: the patient was seen and examined on 05/14/2024   Brief hospital course: Sheri Foster is a 29 y.o. female with medical history significant of  hyperthyroidism presented with fever, neck lymphadenopathy, fatigue, decreased ability to ambulate since last 4 days.  Patient states she has been feeling weak since end of December where she went to ER and was treated for recommended spotted fever.  She has been feeling weak, endorses weight loss, has been feeling dizzy upon getting out of bed.  While PCP was performing right peritoneal lymph node exam she did feel dizzy lightheaded had seizure-like episode.  CBC June 7 showed smudge cells referred to emergency department for further management evaluation.   In the emergency department CT abdomen pelvis showed retroperitoneal lymph nodes, mild pelvic and iliac lymphadenopathy, 6.1 cm hypodense left adrenal mass likely fibroid, CTA chest showed no PE, cardiomegaly with small to moderate pericardial effusion, left-sided pleural effusion and partial subpleural left lower lobe consolidation, bilateral axillary and subpectoral adenopathy. Patient is currently being admitted to [facility] service for evaluation of diffuse lymphadenopathy, pneumonia, AKI, dehydration and syncope  Assessment and Plan: Sepsis possibly due to pneumonia: Presented with fever, tachycardia, AKI. Fever could also due to suspicion of active lupus vs lymphoma. Discussed with Dr. Jeannetta who advised IV steroids for active lupus. Continue IV Rocephin , azithromycin  therapy.  Encourage incentive spirometry, out of bed to chair.  Fever, joint pain, weakness, rash, lymphadenopathy- Autoimmune panel positive for ANA, dsDNA, RNP, SSA Ro, La, smooth muscle, thyrotropin receptor.  CT soft tissue neck, head, chest, abdomen reviewed- Multiple lymph node enlargements in the cervical, axillary, mediastinal,  inguinal areas noted.  IR performed inguinal lymph node biopsy, follow path. She did report joint pains, body aches, fatigue, rash. Dr. Jeannetta rheumatologist, advised 1mg / kg IV steroid dose. Continue Solumedrol 40mg  q12 for active lupus. Continue Pantoprazole .  Acute kidney injury- In the setting of dehydration, poor oral intake, low blood pressures. Kidney function improved. Encourage oral fluids. Avoid nephrotoxic drugs.  Hyponatremia: Due to poor oral intake.  Sodium improved.  Syncope- Differential include vasovagal, dehydration and hypotension. She has poor oral intake. Encourage oral diet, supplements. She is feeling better today. Echocardiogram shows moderate pericardia effusion. Repeat echo with small effusion. Cardiology advised outpatient follow up  Acute on chronic anemia- Hemoglobin dropped but remained stable around 8. Continue iron supplements. Monitor H/H.  Obesity class I- BMI 32.61. Diet, exercise and weight reduction advised.      Out of bed to chair. Incentive spirometry. Nursing supportive care. Fall, aspiration precautions. Diet:  Diet Orders (From admission, onward)     Start     Ordered   05/11/24 1548  Diet regular Room service appropriate? Yes; Fluid consistency: Thin  Diet effective now       Question Answer Comment  Room service appropriate? Yes   Fluid consistency: Thin      05/11/24 1548           DVT prophylaxis: Place and maintain sequential compression device Start: 05/12/24 1356 SCDs Start: 05/11/24 1548  Level of care: Telemetry   Code Status: Full Code  Subjective: Patient is seen and examined today morning.  She feels better today, getting out of bed. Eating fair. Accompanied by her friend. No other complaints.  Physical Exam: Vitals:   05/13/24 1411 05/13/24 2118 05/14/24 0435 05/14/24 1234  BP: (!) 137/91 (!) 137/96 ROLLEN)  143/100 (!) 133/90  Pulse: 67 77 65 65  Resp: 16 18 18    Temp: 98 F (36.7 C) (!) 97.5 F (36.4  C) (!) 97.4 F (36.3 C) (!) 97.5 F (36.4 C)  TempSrc: Oral Oral Oral Oral  SpO2: 100% 99% 100%   Weight:      Height:        General - Young obese African-American female, no apparent distress HEENT - PERRLA, EOMI, atraumatic head, right parotid/lymph node enlarged, tender. Lung - Clear, basal rales, no rhonchi, wheezes. Heart - S1, S2 heard, no murmurs, rubs, no pedal edema. Abdomen - Soft, non tender, obese, bowel sounds good Neuro - Alert, awake and oriented x 3, non focal exam. Skin - Warm and dry.  Data Reviewed:      Latest Ref Rng & Units 05/14/2024    6:15 AM 05/13/2024    7:29 AM 05/12/2024   12:49 AM  CBC  WBC 4.0 - 10.5 K/uL 5.5  3.2  3.4   Hemoglobin 12.0 - 15.0 g/dL 8.2  7.9  7.7   Hematocrit 36.0 - 46.0 % 25.9  24.6  23.8   Platelets 150 - 400 K/uL 301  266  200       Latest Ref Rng & Units 05/13/2024    8:24 PM 05/12/2024   12:49 AM 05/11/2024    6:29 PM  BMP  Glucose 70 - 99 mg/dL 882  92  97   BUN 6 - 20 mg/dL 16  17  19    Creatinine 0.44 - 1.00 mg/dL 9.27  9.11  9.11   Sodium 135 - 145 mmol/L 134  135  137   Potassium 3.5 - 5.1 mmol/L 4.7  4.0  4.0   Chloride 98 - 111 mmol/L 106  109  109   CO2 22 - 32 mmol/L 21  19  22    Calcium 8.9 - 10.3 mg/dL 8.4  7.3  7.5    US  Abdomen Complete Result Date: 05/13/2024 EXAM: COMPLETE ABDOMINAL ULTRASOUND TECHNIQUE: Real-time ultrasonography of the abdomen was performed. COMPARISON: US  Abdomen Complete 02/04/2023, CT from 05/10/2024. CLINICAL HISTORY: Abdominal pain. FINDINGS: LIVER: Increased echogenicity in the liver is noted, consistent with fatty infiltration. No intrahepatic biliary ductal dilatation. No mass. BILIARY SYSTEM: Gallbladder wall thickness measures 1.7 mm. No pericholecystic fluid. No cholelithiasis. Common bile duct is within normal limits measuring 2.8 mm. KIDNEYS: Right Kidney measures 10.5 cm in length. Left Kidney measures 10.8 cm in length. Normal contour of kidneys. Normal cortical echogenicity.  No hydronephrosis. No calculus. No mass. PANCREAS: Visualized portions of the pancreas are unremarkable. SPLEEN: Spleen length measures 10.9 cm. No other abnormality. VESSELS: Visualized portion of the aorta is normal. Visualized portion of the inferior vena cava is normal. Hepatopetal flow in the portal vein. OTHER: Minimal free fluid is noted adjacent to the spleen. IMPRESSION: 1. Fatty liver. 2. Minimal free fluid adjacent to the spleen. Electronically signed by: Oneil Devonshire MD 05/13/2024 08:19 PM EST RP Workstation: [id number]   ECHOCARDIOGRAM LIMITED Result Date: 05/13/2024    ECHOCARDIOGRAM LIMITED REPORT   Patient Name:   Sheri Foster Date of Exam: 05/13/2024 Medical Rec #:  [id number]        Height:       64.0 in Accession #:    [id number]       Weight:       190.0 lb Date of Birth:  Jun 24, 1995        BSA:  1.914 m Patient Age:    28 years         BP:           122/91 mmHg Patient Gender: F                HR:           73 bpm. Exam Location:  Inpatient Procedure: Limited Echo (Both Spectral and Color Flow Doppler were utilized            during procedure). Indications:    I31.3 Pericardial effusion (noninflammatory)  History:        Patient has prior history of Echocardiogram examinations, most                 recent 05/11/2024. Signs/Symptoms:Syncope.  Sonographer:    Nathanel Devonshire Referring Phys: [id number] JARED E SEGAL IMPRESSIONS  1. Left ventricular ejection fraction, by estimation, is 60 to 65%. The left ventricle has normal function.  2. Right ventricular systolic function is normal. The right ventricular size is normal.  3. A small pericardial effusion is present. The pericardial effusion is posterior to the left ventricle.  4. The mitral valve is grossly normal. Trivial mitral valve regurgitation. No evidence of mitral stenosis.  5. The inferior vena cava is normal in size with greater than 50% respiratory variability, suggesting right atrial pressure of 3 mmHg. Comparison(s): Changes from  prior study are noted. Effusion is small and improved from prior study. FINDINGS  Left Ventricle: Left ventricular ejection fraction, by estimation, is 60 to 65%. The left ventricle has normal function. Right Ventricle: The right ventricular size is normal. No increase in right ventricular wall thickness. Right ventricular systolic function is normal. Pericardium: A small pericardial effusion is present. The pericardial effusion is posterior to the left ventricle. Mitral Valve: The mitral valve is grossly normal. Trivial mitral valve regurgitation. No evidence of mitral valve stenosis. Venous: The inferior vena cava is normal in size with greater than 50% respiratory variability, suggesting right atrial pressure of 3 mmHg. Additional Comments: There is a small pleural effusion in the left lateral region.  IVC IVC diam: 1.20 cm Darryle Decent MD Electronically signed by Darryle Decent MD Signature Date/Time: 05/13/2024/12:39:07 PM    Final     Family Communication: Discussed with patient, she understand and agree. All questions answered.  Disposition: Status is: inpatient because: Fever, lymphadenopathy workup, IV antibiotics, IV steroids, lymph node path pending  Planned Discharge Destination: Home     Time spent: 44 minutes  Author: Concepcion Riser, MD 05/14/2024 4:15 PM Secure chat 7am to 7pm For on call review [email or web address].    "

## 2024-05-15 DIAGNOSIS — N179 Acute kidney failure, unspecified: Secondary | ICD-10-CM | POA: Diagnosis not present

## 2024-05-15 DIAGNOSIS — R55 Syncope and collapse: Secondary | ICD-10-CM | POA: Diagnosis not present

## 2024-05-15 DIAGNOSIS — J189 Pneumonia, unspecified organism: Secondary | ICD-10-CM | POA: Diagnosis not present

## 2024-05-15 DIAGNOSIS — M3212 Pericarditis in systemic lupus erythematosus: Secondary | ICD-10-CM

## 2024-05-15 LAB — CBC
HCT: 27.2 % — ABNORMAL LOW (ref 36.0–46.0)
Hemoglobin: 8.8 g/dL — ABNORMAL LOW (ref 12.0–15.0)
MCH: 26.5 pg (ref 26.0–34.0)
MCHC: 32.4 g/dL (ref 30.0–36.0)
MCV: 81.9 fL (ref 80.0–100.0)
Platelets: 371 K/uL (ref 150–400)
RBC: 3.32 MIL/uL — ABNORMAL LOW (ref 3.87–5.11)
RDW: 15.2 % (ref 11.5–15.5)
WBC: 8.1 K/uL (ref 4.0–10.5)
nRBC: 0.5 % — ABNORMAL HIGH (ref 0.0–0.2)

## 2024-05-15 LAB — IRON AND TIBC
Iron: 26 ug/dL — ABNORMAL LOW (ref 28–170)
Saturation Ratios: 16 % (ref 10.4–31.8)
TIBC: 160 ug/dL — ABNORMAL LOW (ref 250–450)
UIBC: 134 ug/dL

## 2024-05-15 LAB — C-REACTIVE PROTEIN: CRP: 5.6 mg/dL — ABNORMAL HIGH

## 2024-05-15 LAB — FERRITIN: Ferritin: 642 ng/mL — ABNORMAL HIGH (ref 11–307)

## 2024-05-15 MED ORDER — PANTOPRAZOLE SODIUM 40 MG PO TBEC: 40.0000 mg | DELAYED_RELEASE_TABLET | Freq: Every day | ORAL | 1 refills | Status: DC

## 2024-05-15 MED ORDER — PREDNISONE 10 MG PO TABS: ORAL_TABLET | ORAL | 0 refills | Status: AC

## 2024-05-15 MED ORDER — SODIUM CHLORIDE 0.9 % IV SOLN
1.0000 g | Freq: Every day | INTRAVENOUS | Status: AC
  Administered 2024-05-15: 1 g via INTRAVENOUS
  Filled 2024-05-15: qty 10

## 2024-05-15 MED ORDER — FERROUS SULFATE 325 (65 FE) MG PO TABS: 325.0000 mg | ORAL_TABLET | Freq: Every day | ORAL | 1 refills | Status: DC

## 2024-05-15 NOTE — Plan of Care (Signed)

## 2024-05-15 NOTE — Discharge Summary (Signed)
 " Physician Discharge Summary   Patient: Sheri Foster MRN: [id number] DOB: 02-25-1996  Admit date:     05/10/2024  Discharge date: {dischdate:26783}  Discharge Physician: Concepcion Riser   PCP: Mercer Clotilda SAUNDERS, MD   Recommendations at discharge:  {Tip this will not be part of the note when signed- Example include specific recommendations for outpatient follow-up, pending tests to follow-up on. (Optional):26781}  PCP follow up in 1 week. Rheumatology follow up as scheduled in 2 weeks.  Discharge Diagnoses: Principal Problem:   Syncope Active Problems:   Hyperthyroidism   Sepsis (HCC)   AKI (acute kidney injury)   Syncope, vasovagal   Lymphadenopathy   CAP (community acquired pneumonia)   Hyponatremia   Microcytic anemia   Generalized weakness   Obesity, Class I, BMI 30-34.9  Resolved Problems:   * No resolved hospital problems. [facility] Course: Sheri Foster is a 29 y.o. female with medical history significant of  hyperthyroidism presented with fever, neck lymphadenopathy, fatigue, decreased ability to ambulate since last 4 days.  Patient states she has been feeling weak since end of December where she went to ER and was treated for recommended spotted fever.  She has been feeling weak, endorses weight loss, has been feeling dizzy upon getting out of bed.  While PCP was performing right peritoneal lymph node exam she did feel dizzy lightheaded had seizure-like episode.  CBC June 7 showed smudge cells referred to emergency department for further management evaluation.   In the emergency department CT abdomen pelvis showed retroperitoneal lymph nodes, mild pelvic and iliac lymphadenopathy, 6.1 cm hypodense left adrenal mass likely fibroid, CTA chest showed no PE, cardiomegaly with small to moderate pericardial effusion, left-sided pleural effusion and partial subpleural left lower lobe consolidation, bilateral axillary and subpectoral adenopathy. Patient is currently  being admitted to [facility] service for evaluation of diffuse lymphadenopathy, pneumonia, AKI, dehydration and syncope   Assessment and Plan: Sepsis possibly due to pneumonia: Presented with fever, tachycardia, AKI. Fever could also due to suspicion of active lupus vs lymphoma. Discussed with Dr. Jeannetta who advised IV steroids for active lupus. Finished IV Rocephin , azithromycin  5 day therapy. Encourage incentive spirometry, out of bed to chair. PT/ OT advised home health, rollator, shower chair.   Fever, joint pain, weakness, rash, lymphadenopathy- New Lupus disease- Autoimmune panel positive for ANA, dsDNA, RNP, SSA Ro, La, smooth muscle, thyrotropin receptor.  CT soft tissue neck, head, chest, abdomen reviewed- Multiple lymph node enlargements in the cervical, axillary, mediastinal, inguinal areas noted.  IR performed inguinal lymph node biopsy, path pending. She did report joint pains, body aches, fatigue, rash. Dr. Jeannetta rheumatologist, advised 1mg / kg IV steroid dose. She got Solumedrol 40mg  q12 for active lupus. Continued IV Pantoprazole . Slow steroid taper due to pericardial effusion. Dr. Jeannetta advised 40mg  daily for 1 week then 10mg  drop every week. Advised to follow Rheumatology Dr Jeannetta in 1-2 weeks as scheduled.   Acute kidney injury- In the setting of dehydration, poor oral intake, low blood pressures. Kidney function improved. Encourage oral fluids.    Hyponatremia: Due to poor oral intake.  Sodium improved.   Syncope- Differential include vasovagal, dehydration and hypotension. She has poor oral intake. Encourage oral diet, supplements. She is feeling better today. Echocardiogram shows moderate pericardia effusion in the setting of lupus. Repeat bedside echo with small effusion. Cardiology advised outpatient follow up   Acute on chronic anemia- Hemoglobin dropped but remained stable around 8. Continue iron supplements.   Obesity class  I- BMI 32.61. Diet, exercise and  weight reduction advised.       {Tip this will not be part of the note when signed Body mass index is 32.61 kg/m. ,  Nutrition Documentation    Flowsheet Row ED to Hosp-Admission (Current) from 05/10/2024 in [facility]  Nutrition Problem Inadequate oral intake  Etiology acute illness  Nutrition Goal Patient will meet greater than or equal to 90% of their needs  Interventions Refer to RD note for recommendations, Magic cup  ,  (Optional):26781}  {(NOTE) Pain control PDMP Statment (Optional):26782} Consultants: Oncology, Cardiology Procedures performed: Lymph node biopsy  Disposition: Home health Diet recommendation:  Discharge Diet Orders (From admission, onward)     Start     Ordered   05/15/24 0000  Diet - low sodium heart healthy        05/15/24 1232           Cardiac diet DISCHARGE MEDICATION: Allergies as of 05/15/2024   No Known Allergies      Medication List     TAKE these medications    acetaminophen  500 MG tablet Commonly known as: TYLENOL  Take 500 mg by mouth every 6 (six) hours as needed.   cyclobenzaprine  10 MG tablet Commonly known as: FLEXERIL  Take 1 tablet (10 mg total) by mouth 2 (two) times daily as needed for muscle spasms.   ferrous sulfate  325 (65 FE) MG tablet Take 1 tablet (325 mg total) by mouth daily with breakfast. Start taking on: May 16, 2024   pantoprazole  40 MG tablet Commonly known as: Protonix  Take 1 tablet (40 mg total) by mouth daily.   predniSONE  10 MG tablet Commonly known as: DELTASONE  Take 4 tablets (40 mg total) by mouth daily for 7 days, THEN 3 tablets (30 mg total) daily for 7 days, THEN 2 tablets (20 mg total) daily for 7 days, THEN 1 tablet (10 mg total) daily for 7 days. Start taking on: May 15, 2024   Ubrelvy  50 MG Tabs Generic drug: Ubrogepant  Take 1 tablet (50 mg) at start of migraine.  May repeat dose in 2 hours if headache persists or recurs.               Durable  Medical Equipment  (From admission, onward)           Start     Ordered   05/15/24 1223  DME Shower stool  Once        05/15/24 1232   05/15/24 0000  For home use only DME 4 wheeled rolling walker with seat       Question:  Patient needs a walker to treat with the following condition  Answer:  Physical debility   05/15/24 1232            Follow-up Information     Mercer Clotilda SAUNDERS, MD Follow up in 1 week(s).   Specialty: Family Medicine Contact information: [address] [place] [address] [phone]         Jeannetta Lonni ORN, MD. Schedule an appointment as soon as possible for a visit in 2 week(s).   Specialty: Rheumatology Contact information: [address] [place] [address] [phone]                Discharge Exam: Fredricka Weights   05/11/24 1717  Weight: 86.2 kg      05/15/2024    5:37 AM 05/14/2024    8:13 PM 05/14/2024   12:34 PM  Vitals with BMI  Systolic 130 143 866  Diastolic 98 100 90  Pulse 68 60 65   General - Young obese African-American female, no apparent distress HEENT - PERRLA, EOMI, atraumatic head, right parotid/lymph node enlarged, tenderness improved. Lung - Clear, basal rales, no rhonchi, wheezes. Heart - S1, S2 heard, no murmurs, rubs, no pedal edema. Abdomen - Soft, non tender, obese, bowel sounds good Neuro - Alert, awake and oriented x 3, non focal exam. Skin - Warm and dry.  Condition at discharge: poor  The results of significant diagnostics from this hospitalization (including imaging, microbiology, ancillary and laboratory) are listed below for reference.   Imaging Studies: US  Abdomen Complete Result Date: 05/13/2024 EXAM: COMPLETE ABDOMINAL ULTRASOUND TECHNIQUE: Real-time ultrasonography of the abdomen was performed. COMPARISON: US  Abdomen Complete 02/04/2023, CT from 05/10/2024. CLINICAL HISTORY: Abdominal pain. FINDINGS: LIVER: Increased echogenicity in the liver is noted,  consistent with fatty infiltration. No intrahepatic biliary ductal dilatation. No mass. BILIARY SYSTEM: Gallbladder wall thickness measures 1.7 mm. No pericholecystic fluid. No cholelithiasis. Common bile duct is within normal limits measuring 2.8 mm. KIDNEYS: Right Kidney measures 10.5 cm in length. Left Kidney measures 10.8 cm in length. Normal contour of kidneys. Normal cortical echogenicity. No hydronephrosis. No calculus. No mass. PANCREAS: Visualized portions of the pancreas are unremarkable. SPLEEN: Spleen length measures 10.9 cm. No other abnormality. VESSELS: Visualized portion of the aorta is normal. Visualized portion of the inferior vena cava is normal. Hepatopetal flow in the portal vein. OTHER: Minimal free fluid is noted adjacent to the spleen. IMPRESSION: 1. Fatty liver. 2. Minimal free fluid adjacent to the spleen. Electronically signed by: Oneil Devonshire MD 05/13/2024 08:19 PM EST RP Workstation: [id number]   ECHOCARDIOGRAM LIMITED Result Date: 05/13/2024    ECHOCARDIOGRAM LIMITED REPORT   Patient Name:   Sheri Foster Date of Exam: 05/13/2024 Medical Rec #:  [id number]        Height:       64.0 in Accession #:    [id number]       Weight:       190.0 lb Date of Birth:  1996/03/28        BSA:          1.914 m Patient Age:    28 years         BP:           122/91 mmHg Patient Gender: F                HR:           73 bpm. Exam Location:  Inpatient Procedure: Limited Echo (Both Spectral and Color Flow Doppler were utilized            during procedure). Indications:    I31.3 Pericardial effusion (noninflammatory)  History:        Patient has prior history of Echocardiogram examinations, most                 recent 05/11/2024. Signs/Symptoms:Syncope.  Sonographer:    Nathanel Devonshire Referring Phys: [id number] JARED E SEGAL IMPRESSIONS  1. Left ventricular ejection fraction, by estimation, is 60 to 65%. The left ventricle has normal function.  2. Right ventricular systolic function is normal. The right  ventricular size is normal.  3. A small pericardial effusion is present. The pericardial effusion is posterior to the left ventricle.  4. The mitral valve is grossly normal. Trivial mitral valve regurgitation. No evidence of mitral stenosis.  5. The inferior vena  cava is normal in size with greater than 50% respiratory variability, suggesting right atrial pressure of 3 mmHg. Comparison(s): Changes from prior study are noted. Effusion is small and improved from prior study. FINDINGS  Left Ventricle: Left ventricular ejection fraction, by estimation, is 60 to 65%. The left ventricle has normal function. Right Ventricle: The right ventricular size is normal. No increase in right ventricular wall thickness. Right ventricular systolic function is normal. Pericardium: A small pericardial effusion is present. The pericardial effusion is posterior to the left ventricle. Mitral Valve: The mitral valve is grossly normal. Trivial mitral valve regurgitation. No evidence of mitral valve stenosis. Venous: The inferior vena cava is normal in size with greater than 50% respiratory variability, suggesting right atrial pressure of 3 mmHg. Additional Comments: There is a small pleural effusion in the left lateral region.  IVC IVC diam: 1.20 cm Darryle Decent MD Electronically signed by Darryle Decent MD Signature Date/Time: 05/13/2024/12:39:07 PM    Final    ECHOCARDIOGRAM COMPLETE Result Date: 05/11/2024    ECHOCARDIOGRAM REPORT   Patient Name:   Sheri Foster Date of Exam: 05/11/2024 Medical Rec #:  [id number]        Height:       64.0 in Accession #:    [id number]       Weight:       190.0 lb Date of Birth:  06/20/1995        BSA:          1.914 m Patient Age:    28 years         BP:           113/82 mmHg Patient Gender: F                HR:           96 bpm. Exam Location:  Inpatient Procedure: 2D Echo, Cardiac Doppler and Color Doppler (Both Spectral and Color            Flow Doppler were utilized during procedure). Indications:     Fever R50.9  History:        Patient has no prior history of Echocardiogram examinations.                 Signs/Symptoms:Syncope.  Sonographer:    Merlynn Argyle Referring Phys: [id number] ANASTASSIA DOUTOVA  Sonographer Comments: Image acquisition challenging due to patient body habitus and Image acquisition challenging due to respiratory motion. IMPRESSIONS  1. Moderate pericardial effusion; respiratory variation noted across TV but no RV diastolic collapse; IVC not dilated.  2. Left ventricular ejection fraction, by estimation, is 60 to 65%. The left ventricle has normal function. The left ventricle has no regional wall motion abnormalities. There is mild left ventricular hypertrophy. Left ventricular diastolic parameters were normal.  3. Right ventricular systolic function is normal. The right ventricular size is normal.  4. Moderate pericardial effusion.  5. The mitral valve is normal in structure. No evidence of mitral valve regurgitation. No evidence of mitral stenosis.  6. The aortic valve is tricuspid. Aortic valve regurgitation is not visualized. No aortic stenosis is present.  7. The inferior vena cava is normal in size with greater than 50% respiratory variability, suggesting right atrial pressure of 3 mmHg. Comparison(s): No prior Echocardiogram. FINDINGS  Left Ventricle: Left ventricular ejection fraction, by estimation, is 60 to 65%. The left ventricle has normal function. The left ventricle has no regional wall motion abnormalities. The left ventricular internal cavity size was  normal in size. There is  mild left ventricular hypertrophy. Left ventricular diastolic parameters were normal. Right Ventricle: The right ventricular size is normal. Right ventricular systolic function is normal. Left Atrium: Left atrial size was normal in size. Right Atrium: Right atrial size was normal in size. Pericardium: A moderately sized pericardial effusion is present. Mitral Valve: The mitral valve is normal in structure.  No evidence of mitral valve regurgitation. No evidence of mitral valve stenosis. Tricuspid Valve: The tricuspid valve is normal in structure. Tricuspid valve regurgitation is not demonstrated. No evidence of tricuspid stenosis. Aortic Valve: The aortic valve is tricuspid. Aortic valve regurgitation is not visualized. No aortic stenosis is present. Pulmonic Valve: The pulmonic valve was normal in structure. Pulmonic valve regurgitation is trivial. No evidence of pulmonic stenosis. Aorta: The aortic root is normal in size and structure. Venous: The inferior vena cava is normal in size with greater than 50% respiratory variability, suggesting right atrial pressure of 3 mmHg. IAS/Shunts: No atrial level shunt detected by color flow Doppler. Additional Comments: Moderate pericardial effusion; respiratory variation noted across TV but no RV diastolic collapse; IVC not dilated.  LEFT VENTRICLE PLAX 2D LVIDd:         4.00 cm   Diastology LVIDs:         2.50 cm   LV e' medial:    10.90 cm/s LV PW:         1.30 cm   LV E/e' medial:  6.8 LV IVS:        1.20 cm   LV e' lateral:   9.79 cm/s LVOT diam:     2.00 cm   LV E/e' lateral: 7.5 LV SV:         52 LV SV Index:   27 LVOT Area:     3.14 cm  RIGHT VENTRICLE             IVC RV Basal diam:  4.00 cm     IVC diam: 1.20 cm RV Mid diam:    3.60 cm RV S prime:     13.80 cm/s TAPSE (M-mode): 2.6 cm LEFT ATRIUM             Index        RIGHT ATRIUM           Index LA diam:        3.30 cm 1.72 cm/m   RA Area:     13.10 cm LA Vol (A2C):   19.1 ml 9.98 ml/m   RA Volume:   30.90 ml  16.14 ml/m LA Vol (A4C):   26.7 ml 13.95 ml/m LA Biplane Vol: 22.7 ml 11.86 ml/m  AORTIC VALVE             PULMONIC VALVE LVOT Vmax:   95.00 cm/s  PR End Diast Vel: 8.64 msec LVOT Vmean:  67.600 cm/s LVOT VTI:    0.166 m  AORTA Ao Root diam: 3.20 cm Ao Asc diam:  3.20 cm MITRAL VALVE MV Area (PHT): 4.58 cm    SHUNTS MV E velocity: 73.80 cm/s  Systemic VTI:  0.17 m MV A velocity: 58.50 cm/s  Systemic  Diam: 2.00 cm MV E/A ratio:  1.26 Redell Shallow MD Electronically signed by Redell Shallow MD Signature Date/Time: 05/11/2024/1:50:01 PM    Final    IR LYMPH NODE CORE BIOPSY Result Date: 05/11/2024 INDICATION: 29 year old female with history of indeterminate lymphadenopathy. EXAM: Ultrasound-guided right iliac lymph node biopsy. MEDICATIONS: None. ANESTHESIA/SEDATION: None. FLUOROSCOPY TIME:  None. COMPLICATIONS: None immediate. PROCEDURE: Informed written consent was obtained from the patient after a thorough discussion of the procedural risks, benefits and alternatives. All questions were addressed. Maximal Sterile Barrier Technique was utilized including caps, mask, sterile gowns, sterile gloves, sterile drape, hand hygiene and skin antiseptic. A timeout was performed prior to the initiation of the procedure. Preprocedure ultrasound evaluation of the right groin demonstrated prominent distal iliac lymphadenopathy. The procedure was planned. Subdermal Local anesthesia was administered at the planned needle entry site with 1% lidocaine . Deeper local anesthetic was administered to the periphery of the lymph node under ultrasound visualization. Next, a 17 gauge coaxial introducer needle was directed to the periphery of the lymph node. This followed by acquisition of 4, 18 gauge core biopsy samples. The samples were split between formalin and saline. The needle was removed. Postprocedure imaging demonstrated no evidence of surrounding hematoma or other complicating features. A sterile bandage was applied. The patient tolerated the procedure well and was transferred back to floor in good condition. IMPRESSION: Technically successful ultrasound-guided right iliac lymph node biopsy. Ester Sides, MD [facility] Electronically Signed   By: Ester Sides M.D.   On: 05/11/2024 11:32   CT ABDOMEN PELVIS W CONTRAST Result Date: 05/10/2024 CLINICAL DATA:  Flu  like symptoms lymphadenopathy EXAM: CT ABDOMEN AND PELVIS WITH CONTRAST TECHNIQUE: Multidetector CT imaging of the abdomen and pelvis was performed using the standard protocol following bolus administration of intravenous contrast. RADIATION DOSE REDUCTION: This exam was performed according to the departmental dose-optimization program which includes automated exposure control, adjustment of the mA and/or kV according to patient size and/or use of iterative reconstruction technique. CONTRAST:  80mL OMNIPAQUE  IOHEXOL  350 MG/ML SOLN COMPARISON:  Ultrasound 02/04/2023 FINDINGS: Lower chest: See separately dictated chest CT. Hepatobiliary: No focal liver abnormality is seen. No gallstones, gallbladder wall thickening, or biliary dilatation. Pancreas: Unremarkable. No pancreatic ductal dilatation or surrounding inflammatory changes. Spleen: Upper normal in size at 13 cm Adrenals/Urinary Tract: Adrenal glands are unremarkable. Kidneys are normal, without renal calculi, focal lesion, or hydronephrosis. Bladder is unremarkable. Stomach/Bowel: Stomach within normal limits. No dilated small bowel. Some fluid in the colon. No acute bowel wall thickening Vascular/Lymphatic: Nonaneurysmal aorta. Multiple subcentimeter retroperitoneal lymph nodes. Mildly enlarged left external iliac and pelvic lymph nodes. On the right, lymph nodes measure up to 14 mm and on the left measure up to 15 mm. Reproductive: Hypodense left uterine mass measuring 6.1 x 5.6 cm, probably a fibroid. No adnexal mass Other: No ascites.  No free air Musculoskeletal: No acute or suspicious osseous abnormality IMPRESSION: 1. No CT evidence for acute intra-abdominal or pelvic abnormality. 2. Multiple small retroperitoneal lymph nodes with mild pelvic and iliac adenopathy, these could be inflammatory or neoplastic in etiology. 3. 6.1 cm hypodense left uterine mass, probably a fibroid. Electronically Signed   By: Luke Bun M.D.   On: 05/10/2024 21:47   CT  Soft Tissue Neck W Contrast Result Date: 05/10/2024 CLINICAL DATA:  Initial metastatic disease evaluation. EXAM: CT NECK WITH CONTRAST TECHNIQUE: Multidetector CT imaging of the neck was performed using the standard protocol following the bolus administration of intravenous contrast. RADIATION DOSE REDUCTION: This exam was performed according to the departmental dose-optimization program which includes automated exposure control, adjustment of the mA and/or kV according to patient size and/or use of iterative reconstruction technique. CONTRAST:  80mL OMNIPAQUE  IOHEXOL  350 MG/ML SOLN COMPARISON:  None Available. FINDINGS: Pharynx and larynx: Oral cavity within normal limits. Palatine  tonsils symmetric and within normal limits. Oropharynx and nasopharynx within normal limits. No retropharyngeal collection. Negative epiglottis. Hypopharynx, supraglottic larynx, and glottis within normal limits. Subglottic airway clear. Salivary glands: There is an area of enhancement measuring approximately 2.1 cm at the posterior aspect of the right parotid gland (series 3, image 28). Superimposed 9 mm hypodensity at this location. Contralateral left parotid gland within normal limits. Submandibular glands within normal limits. Thyroid : Normal. Lymph nodes: Increased number of subcentimeter lymph nodes seen extending along the cervical chains bilaterally. No overtly pathologically enlarged lymph nodes. One of these nodes along the left posterior cervical chain measuring 1 cm in short access demonstrates internal hypodensity, consistent with suppuration and/or necrosis (series 3, image 28). Prominent bilateral axillary lymph nodes noted as well, largest of which measures 1.1 cm on the right (series 3, image 97). Vascular: Normal intravascular enhancement seen within the neck. Limited intracranial: Unremarkable. Visualized orbits: Unremarkable. Mastoids and visualized paranasal sinuses: Visualized paranasal sinuses are largely clear.  Mastoid air cells and middle ear cavities are largely clear as well. Skeleton: No worrisome osseous lesions. Upper chest: Better evaluated on dedicated CT of the chest performed at the same time. Small layering left pleural effusion partially visualized. Pericardial effusion partially visualized as well. Other: None. IMPRESSION: 1. Increased number of subcentimeter lymph nodes extending along the cervical chains bilaterally with prominent axillary adenopathy. 1 cm left posterior cervical chain node demonstrates internal hypodensity, consistent with suppuration and/or necrosis. Findings are nonspecific, and could be reactive in nature. Possible nodal metastatic disease or lymphoproliferative disorder would be the primary differential considerations. 2. 2.1 cm area of enhancement with central hypodensity at the posterior aspect of the right parotid gland. Finding is indeterminate, and could reflect an intraparotid lymph node with surrounding enhancement or possibly primary salivary neoplasm. No other overt changes of acute parotitis. 3. Small layering left pleural effusion and pericardial effusion, better evaluated on dedicated CT of the chest performed at the same time. Electronically Signed   By: Morene Hoard M.D.   On: 05/10/2024 21:46   CT Angio Chest PE W and/or Wo Contrast Result Date: 05/10/2024 CLINICAL DATA:  Syncope flu like symptoms EXAM: CT ANGIOGRAPHY CHEST WITH CONTRAST TECHNIQUE: Multidetector CT imaging of the chest was performed using the standard protocol during bolus administration of intravenous contrast. Multiplanar CT image reconstructions and MIPs were obtained to evaluate the vascular anatomy. RADIATION DOSE REDUCTION: This exam was performed according to the departmental dose-optimization program which includes automated exposure control, adjustment of the mA and/or kV according to patient size and/or use of iterative reconstruction technique. CONTRAST:  80mL OMNIPAQUE  IOHEXOL  350  MG/ML SOLN COMPARISON:  Chest x-ray 05/10/2024 FINDINGS: Cardiovascular: Satisfactory opacification of the pulmonary arteries to the segmental level. No evidence of pulmonary embolism. Nonaneurysmal aorta. No dissection. Cardiomegaly. Small moderate pericardial effusion, measures 13 mm maximum thickness on the left side. Mediastinum/Nodes: Patent trachea. No thyroid  mass. Mild bilateral axillary adenopathy. Lymph nodes on the right measuring up to 15 mm. Lymph nodes on the left measuring up to 13 mm. Multiple mildly enlarged subpectoral nodes. Multiple small bilateral subcentimeter supraclavicular nodes. Incompletely visualized small nodules posterior to the right shoulder measuring up to 10 mm on series 5, image 28. Right paratracheal node measuring up to 10 mm. No significantly enlarged hilar nodes. Lungs/Pleura: Small left-sided pleural effusion. Partial subpleural left lower lobe consolidation. Atelectasis in the lingula. Upper Abdomen: See separately dictated CT Musculoskeletal: No acute osseous abnormality. Review of the MIP images confirms the above findings.  IMPRESSION: 1. Negative for acute pulmonary embolus or aortic dissection. 2. Cardiomegaly with small to moderate pericardial effusion. 3. Small left-sided pleural effusion with partial subpleural left lower lobe consolidation, atelectasis versus pneumonia. 4. Mild bilateral axillary and subpectoral adenopathy, indeterminate for reactive versus inflammatory versus neoplastic process. Incompletely visualized small soft tissue nodules posterior to the right shoulder measuring up to 10 mm. Electronically Signed   By: Luke Bun M.D.   On: 05/10/2024 21:37   CT Head Wo Contrast Addendum Date: 05/10/2024 ADDENDUM REPORT: 05/10/2024 21:34 ADDENDUM: Small left occipital soft tissue nodule measuring 5 mm, likely a lymph node. Multiple incompletely visualized Peri auricular and parotid nodules, measuring up to 7 mm on the left and 6 mm on the right,  potentially lymph nodes. Electronically Signed   By: Luke Bun M.D.   On: 05/10/2024 21:34   Result Date: 05/10/2024 CLINICAL DATA:  Syncopal episodes EXAM: CT HEAD WITHOUT CONTRAST TECHNIQUE: Contiguous axial images were obtained from the base of the skull through the vertex without intravenous contrast. RADIATION DOSE REDUCTION: This exam was performed according to the departmental dose-optimization program which includes automated exposure control, adjustment of the mA and/or kV according to patient size and/or use of iterative reconstruction technique. COMPARISON:  CT brain 04/04/2024 FINDINGS: Brain: No evidence of acute infarction, hemorrhage, hydrocephalus, extra-axial collection or mass lesion/mass effect. Vascular: No hyperdense vessel or unexpected calcification. Skull: Normal. Negative for fracture or focal lesion. Sinuses/Orbits: No acute finding. Other: None IMPRESSION: Negative non contrasted CT appearance of the brain. Electronically Signed: By: Luke Bun M.D. On: 05/10/2024 21:23   DG Chest 2 View Result Date: 05/10/2024 EXAM: 2 VIEW(S) XRAY OF THE CHEST 05/10/2024 03:46:31 PM COMPARISON: 03/15/2024 CLINICAL HISTORY: cough, rash, jt pain, fatigue FINDINGS: LUNGS AND PLEURA: Low lung volumes with increased pulmonary vascularity. Subsegmental atelectasis in left mid lung. Small bilateral pleural effusions with overlying subsegmental atelectasis. No pneumothorax. HEART AND MEDIASTINUM: No acute abnormality of the cardiac and mediastinal silhouettes. BONES AND SOFT TISSUES: No acute osseous abnormality. IMPRESSION: 1. Low lung volumes with increased pulmonary vascularity. 2. Small bilateral pleural effusions with overlying atelectasis. Electronically signed by: Waddell Calk MD 05/10/2024 03:55 PM EST RP Workstation: [id number]    Microbiology: Results for orders placed or performed during the hospital encounter of 05/10/24  Resp panel by RT-PCR (RSV, Flu A&B, Covid) Anterior Nasal Swab      Status: None   Collection Time: 05/10/24  8:11 PM   Specimen: Anterior Nasal Swab  Result Value Ref Range Status   SARS Coronavirus 2 by RT PCR NEGATIVE NEGATIVE Final    Comment: (NOTE) SARS-CoV-2 target nucleic acids are NOT DETECTED.  The SARS-CoV-2 RNA is generally detectable in upper respiratory specimens during the acute phase of infection. The lowest concentration of SARS-CoV-2 viral copies this assay can detect is 138 copies/mL. A negative result does not preclude SARS-Cov-2 infection and should not be used as the sole basis for treatment or other patient management decisions. A negative result may occur with  improper specimen collection/handling, submission of specimen other than nasopharyngeal swab, presence of viral mutation(s) within the areas targeted by this assay, and inadequate number of viral copies(<138 copies/mL). A negative result must be combined with clinical observations, patient history, and epidemiological information. The expected result is Negative.  Fact Sheet for Patients:  bloggercourse.com  Fact Sheet for Healthcare Providers:  seriousbroker.it  This test is no t yet approved or cleared by the [place]  FDA and  has been authorized for  detection and/or diagnosis of SARS-CoV-2 by FDA under an Emergency Use Authorization (EUA). This EUA will remain  in effect (meaning this test can be used) for the duration of the COVID-19 declaration under Section 564(b)(1) of the Act, 21 U.S.C.section 360bbb-3(b)(1), unless the authorization is terminated  or revoked sooner.       Influenza A by PCR NEGATIVE NEGATIVE Final   Influenza B by PCR NEGATIVE NEGATIVE Final    Comment: (NOTE) The Xpert Xpress SARS-CoV-2/FLU/RSV plus assay is intended as an aid in the diagnosis of influenza from Nasopharyngeal swab specimens and should not be used as a sole basis for treatment. Nasal washings and aspirates are  unacceptable for Xpert Xpress SARS-CoV-2/FLU/RSV testing.  Fact Sheet for Patients: bloggercourse.com  Fact Sheet for Healthcare Providers: seriousbroker.it  This test is not yet approved or cleared by the [place]  FDA and has been authorized for detection and/or diagnosis of SARS-CoV-2 by FDA under an Emergency Use Authorization (EUA). This EUA will remain in effect (meaning this test can be used) for the duration of the COVID-19 declaration under Section 564(b)(1) of the Act, 21 U.S.C. section 360bbb-3(b)(1), unless the authorization is terminated or revoked.     Resp Syncytial Virus by PCR NEGATIVE NEGATIVE Final    Comment: (NOTE) Fact Sheet for Patients: bloggercourse.com  Fact Sheet for Healthcare Providers: seriousbroker.it  This test is not yet approved or cleared by the [place]  FDA and has been authorized for detection and/or diagnosis of SARS-CoV-2 by FDA under an Emergency Use Authorization (EUA). This EUA will remain in effect (meaning this test can be used) for the duration of the COVID-19 declaration under Section 564(b)(1) of the Act, 21 U.S.C. section 360bbb-3(b)(1), unless the authorization is terminated or revoked.  Performed at [facility], [address], [place], [place] [address]   Culture, blood (x 2)     Status: None (Preliminary result)   Collection Time: 05/11/24  5:29 PM   Specimen: BLOOD RIGHT HAND  Result Value Ref Range Status   Specimen Description   Final    BLOOD RIGHT HAND Performed at [facility], [address], [place], [place] [address]    Special Requests   Final    BOTTLES DRAWN AEROBIC AND ANAEROBIC Blood Culture results may not be optimal due to an inadequate volume of blood received in culture bottles Performed at [facility], [address] [place], [place] [address]     Culture   Final    NO GROWTH 4 DAYS Performed at [facility], [address], [place], [place] [address]    Report Status PENDING  Incomplete  Culture, blood (x 2)     Status: None (Preliminary result)   Collection Time: 05/11/24  5:30 PM   Specimen: BLOOD RIGHT ARM  Result Value Ref Range Status   Specimen Description   Final    BLOOD RIGHT ARM Performed at [facility], [address], [place], [place] [address]    Special Requests   Final    BOTTLES DRAWN AEROBIC AND ANAEROBIC Blood Culture results may not be optimal due to an inadequate volume of blood received in culture bottles Performed at [facility], [address], [place], [place] [address]    Culture   Final    NO GROWTH 4 DAYS Performed at [facility], [address] [place], [place] [address]    Report Status PENDING  Incomplete  Labs: CBC: Recent Labs  Lab 05/10/24 1628 05/10/24 1841 05/12/24 0049 05/13/24 0729 05/14/24 0615 05/15/24 0526  WBC 5.0 6.3 3.4* 3.2* 5.5 8.1  NEUTROABS 3.4 4.4  --   --   --   --   HGB 9.8* 9.3* 7.7* 7.9* 8.2* 8.8*  HCT 29.9* 29.3* 23.8* 24.6* 25.9* 27.2*  MCV 79.2 82.1 81.0 82.0 83.8 81.9  PLT 242.0 270 200 266 301 371   Basic Metabolic Panel: Recent Labs  Lab 05/10/24 1841 05/11/24 0452 05/11/24 0453 05/11/24 1731 05/11/24 1829 05/12/24 0049 05/13/24 2024  NA 130*  --  134* 135 137 135 134*  K 4.4  --  5.1 4.3 4.0 4.0 4.7  CL 100  --  108 110 109 109 106  CO2 21*  --  20* 20* 22 19* 21*  GLUCOSE 125*  --  111* 89 97 92 117*  BUN 21*  --  13 19 19 17 16   CREATININE 1.48*  --  1.19* 0.86 0.88 0.88 0.72  CALCIUM 7.9*  --  7.1* 7.8* 7.5* 7.3* 8.4*  MG 2.2  --   --   --   --  2.1  --   PHOS  --  4.2  --   --   --  2.6  --    Liver Function Tests: Recent Labs  Lab 05/10/24 1544 05/10/24 1841 05/13/24 2024  AST  --  37 55*  ALT  --  13 24  ALKPHOS  --  21* 24*  BILITOT  --  0.6 0.4  PROT 7.8 7.9 7.4  ALBUMIN  --   2.7* 2.8*   CBG: No results for input(s): GLUCAP in the last 168 hours.  Discharge time spent: 36 minutes.  Signed: Concepcion Riser, MD [facility] 05/15/2024 "

## 2024-05-15 NOTE — Progress Notes (Signed)
 Occupational Therapy Treatment Patient Details Name: Sheri Foster MRN: [id number] DOB: 1996/03/25 Today's Date: 05/15/2024   History of present illness Sheri Foster is a 29 y.o. female  presented on 05/10/24 with fever, neck lymphadenopathy, fatigue, decreased ability to ambulate.  Patient is currently being admitted to TRH service for evaluation of diffuse lymphadenopathy, pneumonia, AKI, dehydration and syncope.  Pt had biopsy of lymph node with results pending.  Patient has been sick for the past 4 to 5 days for fevers up to 101 and lymph nodes in her neck which is swollen sore throat, pain worse on the right side of her cheek.  She also have had some bodyaches blisters on her hands and feet that has been ongoing for the past few months has been evaluated supposedly for [facility] spotted fever and Lyme disease and workup according to patient has been negative.  Endorses weight loss.  No personal history of autoimmune disorders no family history of lupus or RA.  She does have history of eczema.  But her primary care notes CBC done on 7 January showed smudge cells. Patient went to her primary care provider and had 2 episodes of seizure-like activity in the office lasting between 15 to 30 seconds with LOC, no incontinence, no postictal state, there was some lightheadedness associated with them. Pt with medical history significant of hyperthyroidism   OT comments  Session focus on education prior to discharge with regard to energy conservation. OT educating on stand up desk for work, stairs, showering, and overall generalized energy conservation strategies (handout provided at the end of the session). Excellent teach-back noted throughout. Recommendation remains approrpiate, OT will continue to follow acutely.       If plan is discharge home, recommend the following:  A little help with bathing/dressing/bathroom;A little help with walking and/or transfers;Assistance with cooking/housework;Assist  for transportation;Help with stairs or ramp for entrance   Equipment Recommendations  Tub/shower seat    Recommendations for Other Services      Precautions / Restrictions Precautions Precautions: None Recall of Precautions/Restrictions: Intact Precaution/Restrictions Comments: dizziness       Mobility Bed Mobility                    Transfers                         Balance                                           ADL either performed or assessed with clinical judgement   ADL Overall ADL's : Needs assistance/impaired                                       General ADL Comments: Session focus on education prior to discharge with regard to energy conservation. OT educating on stand up desk for work, stairs, showering, and overall generalized energy conservation strategies (handout provided at the end of the session). Excellent teach-back noted throughout. Recommendation remains approrpiate, OT will continue to follow acutely.    Extremity/Trunk Assessment              Vision       Perception     Praxis     Communication Communication Communication: No apparent difficulties  Cognition Arousal: Alert Behavior During Therapy: WFL for tasks assessed/performed Cognition: No apparent impairments                               Following commands: Intact        Cueing   Cueing Techniques: Verbal cues  Exercises      Shoulder Instructions       General Comments      Pertinent Vitals/ Pain          Home Living                                          Prior Functioning/Environment              Frequency  Min 2X/week        Progress Toward Goals  OT Goals(current goals can now be found in the care plan section)  Progress towards OT goals: Progressing toward goals  Acute Rehab OT Goals Patient Stated Goal: to go home OT Goal Formulation: With  patient/family Time For Goal Achievement: 05/26/24 Potential to Achieve Goals: Good  Plan      Co-evaluation                 AM-PAC OT 6 Clicks Daily Activity     Outcome Measure   Help from another person eating meals?: None Help from another person taking care of personal grooming?: A Little Help from another person toileting, which includes using toliet, bedpan, or urinal?: A Little Help from another person bathing (including washing, rinsing, drying)?: A Little Help from another person to put on and taking off regular upper body clothing?: A Little Help from another person to put on and taking off regular lower body clothing?: A Little 6 Click Score: 19    End of Session    OT Visit Diagnosis: Muscle weakness (generalized) (M62.81)   Activity Tolerance Patient tolerated treatment well   Patient Left in bed;with call bell/phone within reach;with family/visitor present   Nurse Communication Mobility status        Time: 8694-8679 OT Time Calculation (min): 15 min  Charges: OT General Charges $OT Visit: 1 Visit OT Treatments $Self Care/Home Management : 8-22 mins  Ronal Gift E. Jaeanna Mccomber, OTR/L Acute Rehabilitation Services [phone]   Ronal Gift Salt 05/15/2024, 2:15 PM

## 2024-05-16 ENCOUNTER — Telehealth: Payer: Self-pay

## 2024-05-16 DIAGNOSIS — M329 Systemic lupus erythematosus, unspecified: Secondary | ICD-10-CM

## 2024-05-16 LAB — CULTURE, BLOOD (ROUTINE X 2)
Culture: NO GROWTH
Culture: NO GROWTH

## 2024-05-16 NOTE — Transitions of Care (Post Inpatient/ED Visit) (Signed)
" ° °  05/16/2024  Name: Sheri Foster MRN: [id number] DOB: [birth year]  Today's TOC FU Call Status: Today's TOC FU Call Status:: Successful TOC FU Call Completed TOC FU Call Complete Date: 05/16/24  Patient's Name and Date of Birth confirmed. Name, DOB  Transition Care Management Follow-up Telephone Call Date of Discharge: 05/15/24 Discharge Facility: [facility]) Type of Discharge: Inpatient Admission Primary Inpatient Discharge Diagnosis:: syncope How have you been since you were released from the hospital?: Better Any questions or concerns?: No  Items Reviewed: Did you receive and understand the discharge instructions provided?: Yes Medications obtained,verified, and reconciled?: Yes (Medications Reviewed) Any new allergies since your discharge?: No Dietary orders reviewed?: Yes Do you have support at home?: Yes People in Home [RPT]: sibling(s)  Medications Reviewed Today: Medications Reviewed Today     Reviewed by Emmitt Pan, LPN (Licensed Practical Nurse) on 05/16/24 at [phone]  Med List Status: <None>   Medication Order Taking? Sig Documenting Provider Last Dose Status Informant  acetaminophen  (TYLENOL ) 500 MG tablet [id number] Yes Take 500 mg by mouth every 6 (six) hours as needed. [provider]  Active Self  cyclobenzaprine  (FLEXERIL ) 10 MG tablet [id number] Yes Take 1 tablet (10 mg total) by mouth 2 (two) times daily as needed for muscle spasms. Ajewole, Christana, MD  Active Self  ferrous sulfate  325 (65 FE) MG tablet [id number] Yes Take 1 tablet (325 mg total) by mouth daily with breakfast. Darci Pore, MD  Active   pantoprazole  (PROTONIX ) 40 MG tablet [id number] Yes Take 1 tablet (40 mg total) by mouth daily. Darci Pore, MD  Active   predniSONE  (DELTASONE ) 10 MG tablet [id number] Yes Take 4 tablets (40 mg total) by mouth daily for 7 days, THEN 3 tablets (30 mg total) daily for 7 days, THEN 2 tablets (20 mg total) daily for 7 days, THEN  1 tablet (10 mg total) daily for 7 days. Darci Pore, MD  Active   Ubrogepant  (UBRELVY ) 50 MG TABS [id number] Yes Take 1 tablet (50 mg) at start of migraine.  May repeat dose in 2 hours if headache persists or recurs. Mercer Clotilda SAUNDERS, MD  Active Self            Home Care and Equipment/Supplies: Were Home Health Services Ordered?: NA Any new equipment or medical supplies ordered?: NA  Functional Questionnaire: Do you need assistance with bathing/showering or dressing?: No Do you need assistance with meal preparation?: No Do you need assistance with eating?: No Do you have difficulty maintaining continence: No Do you need assistance with getting out of bed/getting out of a chair/moving?: No Do you have difficulty managing or taking your medications?: No  Follow up appointments reviewed: PCP Follow-up appointment confirmed?: No (declined now, wants to wait until specialist appt are confirmed) Specialist Hospital Follow-up appointment confirmed?: No Reason Specialist Follow-Up Not Confirmed: Patient has Specialist Provider Number and will Call for Appointment Do you need transportation to your follow-up appointment?: No Do you understand care options if your condition(s) worsen?: Yes-patient verbalized understanding    SIGNATURE Pan Emmitt, LPN [facility] Nurse Health Advisor Direct Dial [phone]  "

## 2024-05-17 DIAGNOSIS — Z0279 Encounter for issue of other medical certificate: Secondary | ICD-10-CM

## 2024-05-17 LAB — SURGICAL PATHOLOGY

## 2024-05-18 ENCOUNTER — Telehealth: Payer: Self-pay | Admitting: Oncology

## 2024-05-18 ENCOUNTER — Ambulatory Visit: Admitting: Internal Medicine

## 2024-05-18 NOTE — Telephone Encounter (Signed)
 Brief Note: Called patient to discuss pathology resulted.  Patient appreciative of call. States that she has an appt with Dr. Leia on 06/01/24.  Reports that she is feeling much better, no other complaints offered.  Olam JINNY Brunner, NP   FINAL MICROSCOPIC DIAGNOSIS:  A. RIGHT ILIAC LYMPH NODE, BIOPSY: -  Scant fragments of overall reactive appearing lymph node  Note: The biopsy consists of scant cores of heavily fragmented lymphoid tissue.  The overall architecture and immuno architecture is difficult to accurately ascertain given the limited amount of tissue and prominent fragmentation.  It should be noted though that sinuses do appear open. Overall, however, there is a mix of B and T cells with the CD20 positive B cells appear to correspond to focal reactive germinal centers (BCL6/CD10 positive and Bcl-2 appropriately negative) with background adjacent CD3, CD5 and CD43 positive T cells.  Cyclin D1 is appropriately negative.  CD23 highlights a very focal follicular dendritic cell meshwork.  The proliferation rate by Ki-67 overall up does not appear increased.  In addition, concurrent flow cytometry shows no immunophenotypic evidence of a lymphoproliferative disorder.  The overall morphologic and immunophenotypic findings support a diagnosis of a reactive lymph node.

## 2024-05-23 LAB — LUPUS (SLE) ANALYSIS
Anti Nuclear Antibody (ANA): POSITIVE — AB
Complement C4, Serum: 2 mg/dL — ABNORMAL LOW (ref 12–38)
ENA RNP Ab: >8 AI — ABNORMAL HIGH (ref 0.0–0.9)
ENA SM Ab Ser-aCnc: >8 AI — ABNORMAL HIGH (ref 0.0–0.9)
ENA SSA (RO) Ab: >8 AI — ABNORMAL HIGH (ref 0.0–0.9)
ENA SSB (LA) Ab: 4.3 AI — ABNORMAL HIGH (ref 0.0–0.9)
Mitochondrial Ab: <20 U (ref 0.0–20.0)
Parietal Cell Ab: 2 U (ref 0.0–20.0)
Scleroderma (Scl-70) (ENA) Antibody, IgG: 0.4 AI (ref 0.0–0.9)
Smooth Muscle Ab: 24 U — ABNORMAL HIGH (ref 0–19)
Thyroperoxidase Ab SerPl-aCnc: 118 [IU]/mL — ABNORMAL HIGH (ref 0–34)
dsDNA Ab: >300 [IU]/mL — ABNORMAL HIGH (ref 0–9)

## 2024-05-24 DIAGNOSIS — Z0279 Encounter for issue of other medical certificate: Secondary | ICD-10-CM

## 2024-05-25 ENCOUNTER — Encounter: Payer: Self-pay | Admitting: Internal Medicine

## 2024-05-25 ENCOUNTER — Ambulatory Visit: Attending: Internal Medicine | Admitting: Internal Medicine

## 2024-05-25 VITALS — BP 128/64 | HR 99 | Ht 64.0 in | Wt 181.0 lb

## 2024-05-25 DIAGNOSIS — R55 Syncope and collapse: Secondary | ICD-10-CM

## 2024-05-25 DIAGNOSIS — R002 Palpitations: Secondary | ICD-10-CM

## 2024-05-25 DIAGNOSIS — R071 Chest pain on breathing: Secondary | ICD-10-CM | POA: Diagnosis not present

## 2024-05-25 DIAGNOSIS — M3212 Pericarditis in systemic lupus erythematosus: Secondary | ICD-10-CM | POA: Diagnosis not present

## 2024-05-25 DIAGNOSIS — I3139 Other pericardial effusion (noninflammatory): Secondary | ICD-10-CM | POA: Diagnosis not present

## 2024-05-25 LAB — VITAMIN B1: Vitamin B1 (Thiamine): 52.7 nmol/L — ABNORMAL LOW (ref 66.5–200.0)

## 2024-05-25 NOTE — Progress Notes (Signed)
 " Cardiology Office Note:  .   Date:  05/25/2024  ID:  Sheri Foster, Sheri Foster [birth year], MRN [id number] PCP: Mercer Clotilda SAUNDERS, MD  [facility] Providers Cardiologist:  Emeline FORBES Calender, DO    History of Present Illness: .     Discussed the use of AI scribe software for clinical note transcription with the patient, who gave verbal consent to proceed.  History of Present Illness Sheri Foster is a 29 year old female with pericardial effusion, suspected lupus and vasovagal syncope who presents for post-hospital follow-up.  Recent hospitalization with inflammatory symptoms concerning for newly diagnosed SLE - Admitted to [facility] on May 10, 2024, for fever, neck lymphadenopathy, fatigue, and generalized weakness with decreased ambulation for 4-5 days - Diagnosed with severe sepsis secondary to community-acquired pneumonia and likely new onset lupus - Received IV steroids with a slow taper during hospitalization, currently ongoing  Neurological events likely vasovagal syncope - Experienced two seizure-like episodes with loss of consciousness during a PCP examination of lymphadenopathy, leading to hospital admission  Pericardial effusion and cardiac symptoms related to rheumatologic diagnosis - Moderate pericardial effusion identified on echocardiogram and CT during hospitalization - Repeat echocardiogram showed improvement in pericardial effusion - Elevated resting heart rate around 100 bpm, primarily when lying down - No lower extremity swelling or shortness of breath  Autoimmune disease evaluation and steroid therapy - Positive autoimmune panel during hospitalization - Not yet officially diagnosed with lupus; awaiting confirmation from rheumatology - Currently on prednisone  taper with about 3 weeks left  Respiratory symptoms and functional status - Discomfort when yawning - Tight sensation on right side with deep inspiration - Uses spirometer several times  daily, which causes some discomfort - No recent chest CT; previously informed of fluid and inflammation in the chest - Attempting to walk 5 minutes daily on treadmill          ROS: Remaining review of systems negative  Studies Reviewed: .        Results Labs ANA (04/2024): Positive Double stranded DNA antibody (04/2024): Positive  Diagnostic Echocardiogram (05/11/2024): Moderate pericardial effusion with respiratory variation across the tricuspid valve, no right ventricular diastolic collapse, inferior vena cava not dilated, ejection fraction 60-65%, mild left ventricular hypertrophy Echocardiogram (05/13/2024): Small pericardial effusion posterior to the left ventricle with interval improvement in effusion size compared to prior study Risk Assessment/Calculations:             Physical Exam:   VS:  BP 128/64   Pulse 99   Ht 5' 4 (1.626 m)   Wt 181 lb (82.1 kg)   LMP 05/10/2024 (Approximate)   SpO2 97%   BMI 31.07 kg/m    Wt Readings from Last 3 Encounters:  05/25/24 181 lb (82.1 kg)  05/11/24 189 lb 15.9 oz (86.2 kg)  05/10/24 190 lb (86.2 kg)    GEN: Well nourished, well developed in no acute distress NECK: No JVD; No carotid bruits CARDIAC: RRR, no murmurs, no rubs, no gallops RESPIRATORY:  Clear to auscultation without rales, wheezing or rhonchi  ABDOMEN: Soft, non-tender, non-distended EXTREMITIES:  No edema; No deformity   ASSESSMENT AND PLAN: .    Assessment and Plan Assessment & Plan Pericardial effusion without tamponade suspected secondary to recently diagnosed rheumatologic disorder, suspected SLE Moderate pericardial effusion with respiratory variation, no RV diastolic collapse, non-dilated IVC. Initial echocardiogram showed moderate effusion, repeat showed improvement. Likely secondary to underlying rheumatologic disorder, rheumatology follow-up scheduled. No tamponade. Elevated resting heart rate  due to steroid use and effusion. No heart failure  symptoms. - Repeat echocardiogram in 3-4 weeks after she finishes her steroid taper to ensure improvement. - Monitor for syncope, leg swelling, or increased shortness of breath and report if she occurs.  Suspected systemic lupus erythematosus Diagnosed based on positive autoimmune panel including ANA and double-stranded DNA though has not followed up with rheumatology yet for official diagnosis. On steroid taper with prednisone . Elevated resting heart rate likely due to steroid use. - Continue current steroid taper. - Follow up with rheumatology on February 5th, 2026, for further lupus management.  Palpitations, likely due to steroids  Right-sided chest discomfort, noncardiac etiology  Vasovagal syncope Occurred after neck lymphadenopathy massage at PCP office            Follow up: 3 to 6 months  Signed, Emeline FORBES Calender, DO  05/25/2024 8:59 AM    [facility] "

## 2024-05-25 NOTE — Patient Instructions (Signed)
 Medication Instructions:  Your physician recommends that you continue on your current medications as directed. Please refer to the Current Medication list given to you today.  *If you need a refill on your cardiac medications before your next appointment, please call your pharmacy*  Lab Work: None ordered If you have labs (blood work) drawn today and your tests are completely normal, you will receive your results only by: MyChart Message (if you have MyChart) OR A paper copy in the mail If you have any lab test that is abnormal or we need to change your treatment, we will call you to review the results.  Testing/Procedures: Limited Echocardiogram  Follow-Up: At [facility], you and your health needs are our priority.  As part of our continuing mission to provide you with exceptional heart care, our providers are all part of one team.  This team includes your primary Cardiologist (physician) and Advanced Practice Providers or APPs (Physician Assistants and Nurse Practitioners) who all work together to provide you with the care you need, when you need it.  Your next appointment:   6 month(s)  Provider:   Emeline FORBES Calender, DO    We recommend signing up for the patient portal called MyChart.  Sign up information is provided on this After Visit Summary.  MyChart is used to connect with patients for Virtual Visits (Telemedicine).  Patients are able to view lab/test results, encounter notes, upcoming appointments, etc.  Non-urgent messages can be sent to your provider as well.   To learn more about what you can do with MyChart, go to [email or web address].   Other Instructions Your physician has requested that you have an echocardiogram. Echocardiography is a painless test that uses sound waves to create images of your heart. It provides your doctor with information about the size and shape of your heart and how well your hearts chambers and valves are working. This procedure takes  approximately one hour. There are no restrictions for this procedure. Please do NOT wear cologne, perfume, aftershave, or lotions (deodorant is allowed). Please arrive 15 minutes prior to your appointment time.  Please note: We ask at that you not bring children with you during ultrasound (echo/ vascular) testing. Due to room size and safety concerns, children are not allowed in the ultrasound rooms during exams. Our front office staff cannot provide observation of children in our lobby area while testing is being conducted. An adult accompanying a patient to their appointment will only be allowed in the ultrasound room at the discretion of the ultrasound technician under special circumstances. We apologize for any inconvenience.

## 2024-05-31 NOTE — Progress Notes (Unsigned)
 "  Office Visit Note  Patient: Sheri Foster             Date of Birth: [birth year]           MRN: [id number]             PCP: Mercer Clotilda SAUNDERS, MD Referring: Mercer Clotilda SAUNDERS, MD Visit Date: 06/01/2024 Occupation: LOCAL MASTER DATA COORDINATOR  Subjective:  No chief complaint on file.   History of Present Illness: Sheri Foster is a 29 y.o. female ***     Activities of Daily Living:  Patient reports morning stiffness for *** {minute/hour:19697}.   Patient {ACTIONS;DENIES/REPORTS:21021675::Denies} nocturnal pain.  Difficulty dressing/grooming: {ACTIONS;DENIES/REPORTS:21021675::Denies} Difficulty climbing stairs: {ACTIONS;DENIES/REPORTS:21021675::Denies} Difficulty getting out of chair: {ACTIONS;DENIES/REPORTS:21021675::Denies} Difficulty using hands for taps, buttons, cutlery, and/or writing: {ACTIONS;DENIES/REPORTS:21021675::Denies}  No Rheumatology ROS completed.   PMFS History:  Patient Active Problem List   Diagnosis Date Noted   Lupus (systemic lupus erythematosus) (HCC) 05/16/2024   Obesity, Class I, BMI 30-34.9 05/13/2024   Microcytic anemia 05/11/2024   Generalized weakness 05/11/2024   Sepsis (HCC) 05/10/2024   AKI (acute kidney injury) 05/10/2024   Syncope, vasovagal 05/10/2024   Lymphadenopathy 05/10/2024   Syncope 05/10/2024   CAP (community acquired pneumonia) 05/10/2024   Hyponatremia 05/10/2024   Graves disease 05/31/2018   Hyperthyroidism 05/31/2018   Eczema 01/27/2018    Past Medical History:  Diagnosis Date   Depression    Migraines    Thyroid  disease 2019    Family History  Problem Relation Age of Onset   Asthma Mother    Hyperlipidemia Mother    Hypertension Mother    Hyperthyroidism Mother    Hypothyroidism Other    Past Surgical History:  Procedure Laterality Date   IR US  LIVER BIOPSY  05/11/2024   Social History[1] Social History   Social History Narrative   Not on file     Immunization History  Administered  Date(s) Administered   Dtap, Unspecified 10/15/1993, 12/11/1993, 02/17/1994, 11/11/1994, 12/18/1998   HIB, Unspecified 10/15/1993, 12/11/1993, 02/17/1994, 11/11/1994   HPV Quadrivalent 09/30/2005, 02/11/2007, 11/30/2007   Hep A, Unspecified 09/30/2005   Hep B, Unspecified July 04, 1993, 09/18/1993, 05/22/1994   Hepatitis A, Ped/Adol-2 Dose 02/11/2007   Influenza Nasal 04/02/2011   MMR 08/15/1994, 12/18/1998   Meningococcal Conjugate 09/30/2005, 04/02/2011   Polio, Unspecified 10/15/1993, 12/11/1993, 02/17/1994, 12/18/1998   Tdap 09/30/2005, 07/28/2023   Varicella 10/24/1997, 09/30/2005     Objective: Vital Signs: LMP 05/10/2024 (Approximate)    Physical Exam   Musculoskeletal Exam: ***   Investigation: No additional findings.  Imaging: US  Abdomen Complete Result Date: 05/13/2024 EXAM: COMPLETE ABDOMINAL ULTRASOUND TECHNIQUE: Real-time ultrasonography of the abdomen was performed. COMPARISON: US  Abdomen Complete 02/04/2023, CT from 05/10/2024. CLINICAL HISTORY: Abdominal pain. FINDINGS: LIVER: Increased echogenicity in the liver is noted, consistent with fatty infiltration. No intrahepatic biliary ductal dilatation. No mass. BILIARY SYSTEM: Gallbladder wall thickness measures 1.7 mm. No pericholecystic fluid. No cholelithiasis. Common bile duct is within normal limits measuring 2.8 mm. KIDNEYS: Right Kidney measures 10.5 cm in length. Left Kidney measures 10.8 cm in length. Normal contour of kidneys. Normal cortical echogenicity. No hydronephrosis. No calculus. No mass. PANCREAS: Visualized portions of the pancreas are unremarkable. SPLEEN: Spleen length measures 10.9 cm. No other abnormality. VESSELS: Visualized portion of the aorta is normal. Visualized portion of the inferior vena cava is normal. Hepatopetal flow in the portal vein. OTHER: Minimal free fluid is noted adjacent to the spleen. IMPRESSION: 1. Fatty liver. 2. Minimal free fluid adjacent  to the spleen. Electronically signed by:  Oneil Devonshire MD 05/13/2024 08:19 PM EST RP Workstation: [id number]   ECHOCARDIOGRAM LIMITED Result Date: 05/13/2024    ECHOCARDIOGRAM LIMITED REPORT   Patient Name:   Sheri Foster Date of Exam: 05/13/2024 Medical Rec #:  [id number]        Height:       64.0 in Accession #:    [id number]       Weight:       190.0 lb Date of Birth:  10-17-1995        BSA:          1.914 m Patient Age:    28 years         BP:           122/91 mmHg Patient Gender: F                HR:           73 bpm. Exam Location:  Inpatient Procedure: Limited Echo (Both Spectral and Color Flow Doppler were utilized            during procedure). Indications:    I31.3 Pericardial effusion (noninflammatory)  History:        Patient has prior history of Echocardiogram examinations, most                 recent 05/11/2024. Signs/Symptoms:Syncope.  Sonographer:    Nathanel Devonshire Referring Phys: [id number] JARED E SEGAL IMPRESSIONS  1. Left ventricular ejection fraction, by estimation, is 60 to 65%. The left ventricle has normal function.  2. Right ventricular systolic function is normal. The right ventricular size is normal.  3. A small pericardial effusion is present. The pericardial effusion is posterior to the left ventricle.  4. The mitral valve is grossly normal. Trivial mitral valve regurgitation. No evidence of mitral stenosis.  5. The inferior vena cava is normal in size with greater than 50% respiratory variability, suggesting right atrial pressure of 3 mmHg. Comparison(s): Changes from prior study are noted. Effusion is small and improved from prior study. FINDINGS  Left Ventricle: Left ventricular ejection fraction, by estimation, is 60 to 65%. The left ventricle has normal function. Right Ventricle: The right ventricular size is normal. No increase in right ventricular wall thickness. Right ventricular systolic function is normal. Pericardium: A small pericardial effusion is present. The pericardial effusion is posterior to the left ventricle.  Mitral Valve: The mitral valve is grossly normal. Trivial mitral valve regurgitation. No evidence of mitral valve stenosis. Venous: The inferior vena cava is normal in size with greater than 50% respiratory variability, suggesting right atrial pressure of 3 mmHg. Additional Comments: There is a small pleural effusion in the left lateral region.  IVC IVC diam: 1.20 cm Darryle Decent MD Electronically signed by Darryle Decent MD Signature Date/Time: 05/13/2024/12:39:07 PM    Final    ECHOCARDIOGRAM COMPLETE Result Date: 05/11/2024    ECHOCARDIOGRAM REPORT   Patient Name:   Sheri Foster Date of Exam: 05/11/2024 Medical Rec #:  [id number]        Height:       64.0 in Accession #:    [id number]       Weight:       190.0 lb Date of Birth:  10-05-1995        BSA:          1.914 m Patient Age:    28 years         BP:  113/82 mmHg Patient Gender: F                HR:           96 bpm. Exam Location:  Inpatient Procedure: 2D Echo, Cardiac Doppler and Color Doppler (Both Spectral and Color            Flow Doppler were utilized during procedure). Indications:    Fever R50.9  History:        Patient has no prior history of Echocardiogram examinations.                 Signs/Symptoms:Syncope.  Sonographer:    Merlynn Argyle Referring Phys: [id number] ANASTASSIA DOUTOVA  Sonographer Comments: Image acquisition challenging due to patient body habitus and Image acquisition challenging due to respiratory motion. IMPRESSIONS  1. Moderate pericardial effusion; respiratory variation noted across TV but no RV diastolic collapse; IVC not dilated.  2. Left ventricular ejection fraction, by estimation, is 60 to 65%. The left ventricle has normal function. The left ventricle has no regional wall motion abnormalities. There is mild left ventricular hypertrophy. Left ventricular diastolic parameters were normal.  3. Right ventricular systolic function is normal. The right ventricular size is normal.  4. Moderate pericardial effusion.  5. The  mitral valve is normal in structure. No evidence of mitral valve regurgitation. No evidence of mitral stenosis.  6. The aortic valve is tricuspid. Aortic valve regurgitation is not visualized. No aortic stenosis is present.  7. The inferior vena cava is normal in size with greater than 50% respiratory variability, suggesting right atrial pressure of 3 mmHg. Comparison(s): No prior Echocardiogram. FINDINGS  Left Ventricle: Left ventricular ejection fraction, by estimation, is 60 to 65%. The left ventricle has normal function. The left ventricle has no regional wall motion abnormalities. The left ventricular internal cavity size was normal in size. There is  mild left ventricular hypertrophy. Left ventricular diastolic parameters were normal. Right Ventricle: The right ventricular size is normal. Right ventricular systolic function is normal. Left Atrium: Left atrial size was normal in size. Right Atrium: Right atrial size was normal in size. Pericardium: A moderately sized pericardial effusion is present. Mitral Valve: The mitral valve is normal in structure. No evidence of mitral valve regurgitation. No evidence of mitral valve stenosis. Tricuspid Valve: The tricuspid valve is normal in structure. Tricuspid valve regurgitation is not demonstrated. No evidence of tricuspid stenosis. Aortic Valve: The aortic valve is tricuspid. Aortic valve regurgitation is not visualized. No aortic stenosis is present. Pulmonic Valve: The pulmonic valve was normal in structure. Pulmonic valve regurgitation is trivial. No evidence of pulmonic stenosis. Aorta: The aortic root is normal in size and structure. Venous: The inferior vena cava is normal in size with greater than 50% respiratory variability, suggesting right atrial pressure of 3 mmHg. IAS/Shunts: No atrial level shunt detected by color flow Doppler. Additional Comments: Moderate pericardial effusion; respiratory variation noted across TV but no RV diastolic collapse; IVC not  dilated.  LEFT VENTRICLE PLAX 2D LVIDd:         4.00 cm   Diastology LVIDs:         2.50 cm   LV e' medial:    10.90 cm/s LV PW:         1.30 cm   LV E/e' medial:  6.8 LV IVS:        1.20 cm   LV e' lateral:   9.79 cm/s LVOT diam:     2.00 cm  LV E/e' lateral: 7.5 LV SV:         52 LV SV Index:   27 LVOT Area:     3.14 cm  RIGHT VENTRICLE             IVC RV Basal diam:  4.00 cm     IVC diam: 1.20 cm RV Mid diam:    3.60 cm RV S prime:     13.80 cm/s TAPSE (M-mode): 2.6 cm LEFT ATRIUM             Index        RIGHT ATRIUM           Index LA diam:        3.30 cm 1.72 cm/m   RA Area:     13.10 cm LA Vol (A2C):   19.1 ml 9.98 ml/m   RA Volume:   30.90 ml  16.14 ml/m LA Vol (A4C):   26.7 ml 13.95 ml/m LA Biplane Vol: 22.7 ml 11.86 ml/m  AORTIC VALVE             PULMONIC VALVE LVOT Vmax:   95.00 cm/s  PR End Diast Vel: 8.64 msec LVOT Vmean:  67.600 cm/s LVOT VTI:    0.166 m  AORTA Ao Root diam: 3.20 cm Ao Asc diam:  3.20 cm MITRAL VALVE MV Area (PHT): 4.58 cm    SHUNTS MV E velocity: 73.80 cm/s  Systemic VTI:  0.17 m MV A velocity: 58.50 cm/s  Systemic Diam: 2.00 cm MV E/A ratio:  1.26 Redell Shallow MD Electronically signed by Redell Shallow MD Signature Date/Time: 05/11/2024/1:50:01 PM    Final    IR LYMPH NODE CORE BIOPSY Result Date: 05/11/2024 INDICATION: 29 year old female with history of indeterminate lymphadenopathy. EXAM: Ultrasound-guided right iliac lymph node biopsy. MEDICATIONS: None. ANESTHESIA/SEDATION: None. FLUOROSCOPY TIME:  None. COMPLICATIONS: None immediate. PROCEDURE: Informed written consent was obtained from the patient after a thorough discussion of the procedural risks, benefits and alternatives. All questions were addressed. Maximal Sterile Barrier Technique was utilized including caps, mask, sterile gowns, sterile gloves, sterile drape, hand hygiene and skin antiseptic. A timeout was performed prior to the initiation of the procedure. Preprocedure ultrasound evaluation of the right  groin demonstrated prominent distal iliac lymphadenopathy. The procedure was planned. Subdermal Local anesthesia was administered at the planned needle entry site with 1% lidocaine . Deeper local anesthetic was administered to the periphery of the lymph node under ultrasound visualization. Next, a 17 gauge coaxial introducer needle was directed to the periphery of the lymph node. This followed by acquisition of 4, 18 gauge core biopsy samples. The samples were split between formalin and saline. The needle was removed. Postprocedure imaging demonstrated no evidence of surrounding hematoma or other complicating features. A sterile bandage was applied. The patient tolerated the procedure well and was transferred back to floor in good condition. IMPRESSION: Technically successful ultrasound-guided right iliac lymph node biopsy. Ester Sides, MD [facility] Electronically Signed   By: Ester Sides M.D.   On: 05/11/2024 11:32   CT ABDOMEN PELVIS W CONTRAST Result Date: 05/10/2024 CLINICAL DATA:  Flu like symptoms lymphadenopathy EXAM: CT ABDOMEN AND PELVIS WITH CONTRAST TECHNIQUE: Multidetector CT imaging of the abdomen and pelvis was performed using the standard protocol following bolus administration of intravenous contrast. RADIATION DOSE REDUCTION: This exam was performed according to the departmental dose-optimization program which includes automated exposure control, adjustment of the mA and/or kV according to patient size and/or use  of iterative reconstruction technique. CONTRAST:  80mL OMNIPAQUE  IOHEXOL  350 MG/ML SOLN COMPARISON:  Ultrasound 02/04/2023 FINDINGS: Lower chest: See separately dictated chest CT. Hepatobiliary: No focal liver abnormality is seen. No gallstones, gallbladder wall thickening, or biliary dilatation. Pancreas: Unremarkable. No pancreatic ductal dilatation or surrounding inflammatory changes. Spleen: Upper normal in size at 13 cm  Adrenals/Urinary Tract: Adrenal glands are unremarkable. Kidneys are normal, without renal calculi, focal lesion, or hydronephrosis. Bladder is unremarkable. Stomach/Bowel: Stomach within normal limits. No dilated small bowel. Some fluid in the colon. No acute bowel wall thickening Vascular/Lymphatic: Nonaneurysmal aorta. Multiple subcentimeter retroperitoneal lymph nodes. Mildly enlarged left external iliac and pelvic lymph nodes. On the right, lymph nodes measure up to 14 mm and on the left measure up to 15 mm. Reproductive: Hypodense left uterine mass measuring 6.1 x 5.6 cm, probably a fibroid. No adnexal mass Other: No ascites.  No free air Musculoskeletal: No acute or suspicious osseous abnormality IMPRESSION: 1. No CT evidence for acute intra-abdominal or pelvic abnormality. 2. Multiple small retroperitoneal lymph nodes with mild pelvic and iliac adenopathy, these could be inflammatory or neoplastic in etiology. 3. 6.1 cm hypodense left uterine mass, probably a fibroid. Electronically Signed   By: Luke Bun M.D.   On: 05/10/2024 21:47   CT Soft Tissue Neck W Contrast Result Date: 05/10/2024 CLINICAL DATA:  Initial metastatic disease evaluation. EXAM: CT NECK WITH CONTRAST TECHNIQUE: Multidetector CT imaging of the neck was performed using the standard protocol following the bolus administration of intravenous contrast. RADIATION DOSE REDUCTION: This exam was performed according to the departmental dose-optimization program which includes automated exposure control, adjustment of the mA and/or kV according to patient size and/or use of iterative reconstruction technique. CONTRAST:  80mL OMNIPAQUE  IOHEXOL  350 MG/ML SOLN COMPARISON:  None Available. FINDINGS: Pharynx and larynx: Oral cavity within normal limits. Palatine tonsils symmetric and within normal limits. Oropharynx and nasopharynx within normal limits. No retropharyngeal collection. Negative epiglottis. Hypopharynx, supraglottic larynx, and  glottis within normal limits. Subglottic airway clear. Salivary glands: There is an area of enhancement measuring approximately 2.1 cm at the posterior aspect of the right parotid gland (series 3, image 28). Superimposed 9 mm hypodensity at this location. Contralateral left parotid gland within normal limits. Submandibular glands within normal limits. Thyroid : Normal. Lymph nodes: Increased number of subcentimeter lymph nodes seen extending along the cervical chains bilaterally. No overtly pathologically enlarged lymph nodes. One of these nodes along the left posterior cervical chain measuring 1 cm in short access demonstrates internal hypodensity, consistent with suppuration and/or necrosis (series 3, image 28). Prominent bilateral axillary lymph nodes noted as well, largest of which measures 1.1 cm on the right (series 3, image 97). Vascular: Normal intravascular enhancement seen within the neck. Limited intracranial: Unremarkable. Visualized orbits: Unremarkable. Mastoids and visualized paranasal sinuses: Visualized paranasal sinuses are largely clear. Mastoid air cells and middle ear cavities are largely clear as well. Skeleton: No worrisome osseous lesions. Upper chest: Better evaluated on dedicated CT of the chest performed at the same time. Small layering left pleural effusion partially visualized. Pericardial effusion partially visualized as well. Other: None. IMPRESSION: 1. Increased number of subcentimeter lymph nodes extending along the cervical chains bilaterally with prominent axillary adenopathy. 1 cm left posterior cervical chain node demonstrates internal hypodensity, consistent with suppuration and/or necrosis. Findings are nonspecific, and could be reactive in nature. Possible nodal metastatic disease or lymphoproliferative disorder would be the primary differential considerations. 2. 2.1 cm area of enhancement with central hypodensity at the posterior  aspect of the right parotid gland. Finding is  indeterminate, and could reflect an intraparotid lymph node with surrounding enhancement or possibly primary salivary neoplasm. No other overt changes of acute parotitis. 3. Small layering left pleural effusion and pericardial effusion, better evaluated on dedicated CT of the chest performed at the same time. Electronically Signed   By: Morene Hoard M.D.   On: 05/10/2024 21:46   CT Angio Chest PE W and/or Wo Contrast Result Date: 05/10/2024 CLINICAL DATA:  Syncope flu like symptoms EXAM: CT ANGIOGRAPHY CHEST WITH CONTRAST TECHNIQUE: Multidetector CT imaging of the chest was performed using the standard protocol during bolus administration of intravenous contrast. Multiplanar CT image reconstructions and MIPs were obtained to evaluate the vascular anatomy. RADIATION DOSE REDUCTION: This exam was performed according to the departmental dose-optimization program which includes automated exposure control, adjustment of the mA and/or kV according to patient size and/or use of iterative reconstruction technique. CONTRAST:  80mL OMNIPAQUE  IOHEXOL  350 MG/ML SOLN COMPARISON:  Chest x-ray 05/10/2024 FINDINGS: Cardiovascular: Satisfactory opacification of the pulmonary arteries to the segmental level. No evidence of pulmonary embolism. Nonaneurysmal aorta. No dissection. Cardiomegaly. Small moderate pericardial effusion, measures 13 mm maximum thickness on the left side. Mediastinum/Nodes: Patent trachea. No thyroid  mass. Mild bilateral axillary adenopathy. Lymph nodes on the right measuring up to 15 mm. Lymph nodes on the left measuring up to 13 mm. Multiple mildly enlarged subpectoral nodes. Multiple small bilateral subcentimeter supraclavicular nodes. Incompletely visualized small nodules posterior to the right shoulder measuring up to 10 mm on series 5, image 28. Right paratracheal node measuring up to 10 mm. No significantly enlarged hilar nodes. Lungs/Pleura: Small left-sided pleural effusion. Partial  subpleural left lower lobe consolidation. Atelectasis in the lingula. Upper Abdomen: See separately dictated CT Musculoskeletal: No acute osseous abnormality. Review of the MIP images confirms the above findings. IMPRESSION: 1. Negative for acute pulmonary embolus or aortic dissection. 2. Cardiomegaly with small to moderate pericardial effusion. 3. Small left-sided pleural effusion with partial subpleural left lower lobe consolidation, atelectasis versus pneumonia. 4. Mild bilateral axillary and subpectoral adenopathy, indeterminate for reactive versus inflammatory versus neoplastic process. Incompletely visualized small soft tissue nodules posterior to the right shoulder measuring up to 10 mm. Electronically Signed   By: Luke Bun M.D.   On: 05/10/2024 21:37   CT Head Wo Contrast Addendum Date: 05/10/2024 ADDENDUM REPORT: 05/10/2024 21:34 ADDENDUM: Small left occipital soft tissue nodule measuring 5 mm, likely a lymph node. Multiple incompletely visualized Peri auricular and parotid nodules, measuring up to 7 mm on the left and 6 mm on the right, potentially lymph nodes. Electronically Signed   By: Luke Bun M.D.   On: 05/10/2024 21:34   Result Date: 05/10/2024 CLINICAL DATA:  Syncopal episodes EXAM: CT HEAD WITHOUT CONTRAST TECHNIQUE: Contiguous axial images were obtained from the base of the skull through the vertex without intravenous contrast. RADIATION DOSE REDUCTION: This exam was performed according to the departmental dose-optimization program which includes automated exposure control, adjustment of the mA and/or kV according to patient size and/or use of iterative reconstruction technique. COMPARISON:  CT brain 04/04/2024 FINDINGS: Brain: No evidence of acute infarction, hemorrhage, hydrocephalus, extra-axial collection or mass lesion/mass effect. Vascular: No hyperdense vessel or unexpected calcification. Skull: Normal. Negative for fracture or focal lesion. Sinuses/Orbits: No acute finding.  Other: None IMPRESSION: Negative non contrasted CT appearance of the brain. Electronically Signed: By: Luke Bun M.D. On: 05/10/2024 21:23   DG Chest 2 View Result Date: 05/10/2024 EXAM: 2  VIEW(S) XRAY OF THE CHEST 05/10/2024 03:46:31 PM COMPARISON: 03/15/2024 CLINICAL HISTORY: cough, rash, jt pain, fatigue FINDINGS: LUNGS AND PLEURA: Low lung volumes with increased pulmonary vascularity. Subsegmental atelectasis in left mid lung. Small bilateral pleural effusions with overlying subsegmental atelectasis. No pneumothorax. HEART AND MEDIASTINUM: No acute abnormality of the cardiac and mediastinal silhouettes. BONES AND SOFT TISSUES: No acute osseous abnormality. IMPRESSION: 1. Low lung volumes with increased pulmonary vascularity. 2. Small bilateral pleural effusions with overlying atelectasis. Electronically signed by: Waddell Calk MD 05/10/2024 03:55 PM EST RP Workstation: [id number]    Recent Labs: Lab Results  Component Value Date   WBC 8.1 05/15/2024   HGB 8.8 (L) 05/15/2024   PLT 371 05/15/2024   NA 134 (L) 05/13/2024   K 4.7 05/13/2024   CL 106 05/13/2024   CO2 21 (L) 05/13/2024   GLUCOSE 117 (H) 05/13/2024   BUN 16 05/13/2024   CREATININE 0.72 05/13/2024   BILITOT 0.4 05/13/2024   ALKPHOS 24 (L) 05/13/2024   AST 55 (H) 05/13/2024   ALT 24 05/13/2024   PROT 7.4 05/13/2024   ALBUMIN 2.8 (L) 05/13/2024   CALCIUM 8.4 (L) 05/13/2024   GFRAA >60 01/11/2019    Speciality Comments: No specialty comments available.  Procedures:  No procedures performed Allergies: Patient has no known allergies.   Assessment / Plan:     Visit Diagnoses:  Assessment & Plan  ***  Follow-Up Instructions: No follow-ups on file.   Lonni LELON Ester, MD  Note - This record has been created using [employer].  Chart creation errors have been sought, but may not always  have been located. Such creation errors do not reflect on  the standard of medical care.    [1]  Social  History Tobacco Use   Smoking status: Never   Smokeless tobacco: Never  Substance Use Topics   Alcohol use: Yes   Drug use: Never   "

## 2024-06-01 ENCOUNTER — Encounter: Payer: Self-pay | Admitting: Internal Medicine

## 2024-06-01 ENCOUNTER — Ambulatory Visit: Admitting: Internal Medicine

## 2024-06-01 VITALS — BP 122/81 | HR 109 | Temp 97.4°F | Resp 16 | Ht 64.0 in | Wt 184.0 lb

## 2024-06-01 DIAGNOSIS — E611 Iron deficiency: Secondary | ICD-10-CM | POA: Insufficient documentation

## 2024-06-01 DIAGNOSIS — E0500 Thyrotoxicosis with diffuse goiter without thyrotoxic crisis or storm: Secondary | ICD-10-CM

## 2024-06-01 DIAGNOSIS — E559 Vitamin D deficiency, unspecified: Secondary | ICD-10-CM | POA: Insufficient documentation

## 2024-06-01 DIAGNOSIS — I7300 Raynaud's syndrome without gangrene: Secondary | ICD-10-CM | POA: Insufficient documentation

## 2024-06-01 DIAGNOSIS — D509 Iron deficiency anemia, unspecified: Secondary | ICD-10-CM

## 2024-06-01 DIAGNOSIS — M3212 Pericarditis in systemic lupus erythematosus: Secondary | ICD-10-CM

## 2024-06-01 DIAGNOSIS — R591 Generalized enlarged lymph nodes: Secondary | ICD-10-CM

## 2024-06-01 DIAGNOSIS — N179 Acute kidney failure, unspecified: Secondary | ICD-10-CM

## 2024-06-01 DIAGNOSIS — E0590 Thyrotoxicosis, unspecified without thyrotoxic crisis or storm: Secondary | ICD-10-CM

## 2024-06-01 NOTE — Assessment & Plan Note (Addendum)
 Sheri Foster

## 2024-06-01 NOTE — Assessment & Plan Note (Signed)
 Sheri Foster

## 2024-06-01 NOTE — Assessment & Plan Note (Addendum)
 [name]

## 2024-06-01 NOTE — Assessment & Plan Note (Signed)
 [name]

## 2024-06-01 NOTE — Assessment & Plan Note (Addendum)
" °  Orders:   Sedimentation rate   C3 and C4   Anti-DNA antibody, double-stranded   Protein / creatinine ratio, urine   Beta-2 glycoprotein antibodies   Cardiolipin antibodies, IgG, IgM, IgA   CBC with Differential/Platelet   Comprehensive metabolic panel with GFR  "

## 2024-06-01 NOTE — Patient Instructions (Addendum)
 Start taking hydroxychloroquine 2 tablets daily, together in the morning or split AM and PM either is okay.  Taper prednisone  as planned down to 10 mg then continue with 10 mg daily. If you start to notice any worsening symptoms of chest pain, shortness of breath, or joint swelling increasing we can go back up on the dose as needed.  I recommend trying Ferrous Bisglycinate (Gentle Iron) as an alternative form that is easier on the stomach.  I am checking your vitamin D level to recommend how to supplement this.

## 2024-06-02 ENCOUNTER — Inpatient Hospital Stay: Admitting: Family Medicine

## 2024-06-02 LAB — CBC WITH DIFFERENTIAL/PLATELET
Absolute Lymphocytes: 810 {cells}/uL — ABNORMAL LOW (ref 850–3900)
Absolute Monocytes: 362 {cells}/uL (ref 200–950)
Basophils Absolute: 11 {cells}/uL (ref 0–200)
Basophils Relative: 0.2 %
Eosinophils Absolute: 49 {cells}/uL (ref 15–500)
Eosinophils Relative: 0.9 %
HCT: 32.3 % — ABNORMAL LOW (ref 35.9–46.0)
Hemoglobin: 9.8 g/dL — ABNORMAL LOW (ref 11.7–15.5)
MCH: 26.8 pg — ABNORMAL LOW (ref 27.0–33.0)
MCHC: 30.3 g/dL — ABNORMAL LOW (ref 31.6–35.4)
MCV: 88.3 fL (ref 81.4–101.7)
MPV: 11.6 fL (ref 7.5–12.5)
Monocytes Relative: 6.7 %
Neutro Abs: 4169 {cells}/uL (ref 1500–7800)
Neutrophils Relative %: 77.2 %
Platelets: 296 10*3/uL (ref 140–400)
RBC: 3.66 Million/uL — ABNORMAL LOW (ref 3.80–5.10)
RDW: 17.8 % — ABNORMAL HIGH (ref 11.0–15.0)
Total Lymphocyte: 15 %
WBC: 5.4 10*3/uL (ref 3.8–10.8)

## 2024-06-02 LAB — PROTEIN / CREATININE RATIO, URINE
Creatinine, Urine: 261 mg/dL (ref 20–275)
Protein/Creat Ratio: 61 mg/g{creat} (ref 24–184)
Protein/Creatinine Ratio: 0.061 mg/mg{creat} (ref 0.024–0.184)
Total Protein, Urine: 16 mg/dL (ref 5–24)

## 2024-06-02 LAB — COMPREHENSIVE METABOLIC PANEL WITH GFR
AG Ratio: 1.1 (calc) (ref 1.0–2.5)
ALT: 27 U/L (ref 6–29)
AST: 21 U/L (ref 10–30)
Albumin: 3.4 g/dL — ABNORMAL LOW (ref 3.6–5.1)
Alkaline phosphatase (APISO): 26 U/L — ABNORMAL LOW (ref 31–125)
BUN: 14 mg/dL (ref 7–25)
CO2: 26 mmol/L (ref 20–32)
Calcium: 8.5 mg/dL — ABNORMAL LOW (ref 8.6–10.2)
Chloride: 103 mmol/L (ref 98–110)
Creat: 0.73 mg/dL (ref 0.50–0.97)
Globulin: 3 g/dL (ref 1.9–3.7)
Glucose, Bld: 107 mg/dL — ABNORMAL HIGH (ref 65–99)
Potassium: 3.6 mmol/L (ref 3.5–5.3)
Sodium: 137 mmol/L (ref 135–146)
Total Bilirubin: 0.5 mg/dL (ref 0.2–1.2)
Total Protein: 6.4 g/dL (ref 6.1–8.1)
eGFR: 113 mL/min/{1.73_m2}

## 2024-06-02 LAB — C3 AND C4
C3 Complement: 80 mg/dL — ABNORMAL LOW (ref 83–193)
C4 Complement: 5 mg/dL — ABNORMAL LOW (ref 15–57)

## 2024-06-02 LAB — SEDIMENTATION RATE: Sed Rate: 19 mm/h (ref 0–20)

## 2024-06-09 ENCOUNTER — Inpatient Hospital Stay: Admitting: Family Medicine

## 2024-06-21 ENCOUNTER — Ambulatory Visit (HOSPITAL_COMMUNITY)

## 2024-07-27 ENCOUNTER — Ambulatory Visit: Admitting: Internal Medicine

## 2024-08-08 ENCOUNTER — Ambulatory Visit: Admitting: Diagnostic Neuroimaging
